# Patient Record
Sex: Male | Born: 1940
Health system: Southern US, Community
[De-identification: ages and names within clinical notes are randomized; demographics above are authoritative.]

## PROBLEM LIST (undated history)

## (undated) DIAGNOSIS — K589 Irritable bowel syndrome without diarrhea: Secondary | ICD-10-CM

## (undated) DIAGNOSIS — F329 Major depressive disorder, single episode, unspecified: Secondary | ICD-10-CM

## (undated) DIAGNOSIS — F32A Depression, unspecified: Secondary | ICD-10-CM

## (undated) DIAGNOSIS — G4730 Sleep apnea, unspecified: Secondary | ICD-10-CM

## (undated) DIAGNOSIS — M25511 Pain in right shoulder: Secondary | ICD-10-CM

## (undated) DIAGNOSIS — F419 Anxiety disorder, unspecified: Secondary | ICD-10-CM

## (undated) DIAGNOSIS — F410 Panic disorder [episodic paroxysmal anxiety] without agoraphobia: Secondary | ICD-10-CM

## (undated) DIAGNOSIS — J309 Allergic rhinitis, unspecified: Secondary | ICD-10-CM

## (undated) DIAGNOSIS — J45909 Unspecified asthma, uncomplicated: Secondary | ICD-10-CM

## (undated) DIAGNOSIS — E669 Obesity, unspecified: Secondary | ICD-10-CM

## (undated) DIAGNOSIS — E039 Hypothyroidism, unspecified: Secondary | ICD-10-CM

## (undated) DIAGNOSIS — N200 Calculus of kidney: Secondary | ICD-10-CM

## (undated) DIAGNOSIS — K219 Gastro-esophageal reflux disease without esophagitis: Secondary | ICD-10-CM

## (undated) DIAGNOSIS — M1990 Unspecified osteoarthritis, unspecified site: Secondary | ICD-10-CM

## (undated) DIAGNOSIS — E785 Hyperlipidemia, unspecified: Secondary | ICD-10-CM

## (undated) DIAGNOSIS — T7840XA Allergy, unspecified, initial encounter: Secondary | ICD-10-CM

## (undated) HISTORY — DX: Hyperlipidemia, unspecified: E78.5

## (undated) HISTORY — DX: Irritable bowel syndrome, unspecified: K58.9

## (undated) HISTORY — DX: Hypothyroidism, unspecified: E03.9

## (undated) HISTORY — DX: Sleep apnea, unspecified: G47.30

## (undated) HISTORY — DX: Obesity, unspecified: E66.9

## (undated) HISTORY — DX: Calculus of kidney: N20.0

## (undated) HISTORY — DX: Allergic rhinitis, unspecified: J30.9

## (undated) HISTORY — DX: Anxiety disorder, unspecified: F41.9

## (undated) HISTORY — DX: Allergy, unspecified, initial encounter: T78.40XA

## (undated) HISTORY — DX: Gastro-esophageal reflux disease without esophagitis: K21.9

## (undated) HISTORY — DX: Pain in right shoulder: M25.511

## (undated) HISTORY — DX: Major depressive disorder, single episode, unspecified: F32.9

## (undated) HISTORY — DX: Depression, unspecified: F32.A

## (undated) HISTORY — DX: Unspecified asthma, uncomplicated: J45.909

## (undated) HISTORY — PX: VASECTOMY: SHX75

## (undated) HISTORY — DX: Panic disorder (episodic paroxysmal anxiety): F41.0

## (undated) HISTORY — DX: Unspecified osteoarthritis, unspecified site: M19.90

## (undated) HISTORY — PX: TONSILLECTOMY: SUR1361

## (undated) SURGERY — EGD (ESOPHAGOGASTRODUODENOSCOPY)
Anesthesia: Moderate Sedation

---

## 2002-12-28 ENCOUNTER — Ambulatory Visit (HOSPITAL_COMMUNITY): Admission: RE | Admit: 2002-12-28 | Discharge: 2002-12-28 | Payer: Self-pay | Admitting: *Deleted

## 2003-02-24 HISTORY — PX: ROTATOR CUFF REPAIR: SHX139

## 2003-02-24 HISTORY — PX: CATARACT EXTRACTION BILATERAL W/ ANTERIOR VITRECTOMY: SHX1304

## 2003-02-24 HISTORY — PX: TRANSURETHRAL RESECTION OF PROSTATE: SHX73

## 2003-04-30 ENCOUNTER — Ambulatory Visit (HOSPITAL_COMMUNITY): Admission: RE | Admit: 2003-04-30 | Discharge: 2003-04-30 | Payer: Self-pay | Admitting: Orthopedic Surgery

## 2003-05-28 ENCOUNTER — Encounter: Admission: RE | Admit: 2003-05-28 | Discharge: 2003-08-26 | Payer: Self-pay | Admitting: Orthopedic Surgery

## 2003-06-11 ENCOUNTER — Ambulatory Visit (HOSPITAL_COMMUNITY): Admission: RE | Admit: 2003-06-11 | Discharge: 2003-06-11 | Payer: Self-pay | Admitting: Urology

## 2003-06-11 ENCOUNTER — Ambulatory Visit (HOSPITAL_BASED_OUTPATIENT_CLINIC_OR_DEPARTMENT_OTHER): Admission: RE | Admit: 2003-06-11 | Discharge: 2003-06-11 | Payer: Self-pay | Admitting: Urology

## 2004-04-16 ENCOUNTER — Ambulatory Visit: Payer: Self-pay | Admitting: Internal Medicine

## 2004-06-24 ENCOUNTER — Ambulatory Visit: Payer: Self-pay | Admitting: Internal Medicine

## 2004-07-24 ENCOUNTER — Ambulatory Visit: Payer: Self-pay | Admitting: Internal Medicine

## 2004-11-11 ENCOUNTER — Ambulatory Visit: Payer: Self-pay | Admitting: Internal Medicine

## 2005-03-11 ENCOUNTER — Ambulatory Visit: Payer: Self-pay | Admitting: Internal Medicine

## 2005-04-20 ENCOUNTER — Ambulatory Visit: Payer: Self-pay | Admitting: Internal Medicine

## 2005-07-14 ENCOUNTER — Ambulatory Visit: Payer: Self-pay | Admitting: Internal Medicine

## 2005-11-16 ENCOUNTER — Ambulatory Visit: Payer: Self-pay | Admitting: Internal Medicine

## 2006-02-18 ENCOUNTER — Ambulatory Visit: Payer: Self-pay | Admitting: Internal Medicine

## 2006-04-20 ENCOUNTER — Ambulatory Visit: Payer: Self-pay | Admitting: Internal Medicine

## 2006-05-18 ENCOUNTER — Ambulatory Visit: Payer: Self-pay | Admitting: Internal Medicine

## 2006-09-07 ENCOUNTER — Ambulatory Visit: Payer: Self-pay | Admitting: Internal Medicine

## 2007-01-18 ENCOUNTER — Ambulatory Visit: Payer: Self-pay | Admitting: Internal Medicine

## 2007-05-02 DIAGNOSIS — J309 Allergic rhinitis, unspecified: Secondary | ICD-10-CM | POA: Insufficient documentation

## 2007-05-02 DIAGNOSIS — J45998 Other asthma: Secondary | ICD-10-CM | POA: Insufficient documentation

## 2007-05-03 ENCOUNTER — Ambulatory Visit: Payer: Self-pay | Admitting: Internal Medicine

## 2007-06-22 ENCOUNTER — Telehealth: Payer: Self-pay | Admitting: Internal Medicine

## 2007-08-09 ENCOUNTER — Ambulatory Visit: Payer: Self-pay | Admitting: Internal Medicine

## 2007-09-26 ENCOUNTER — Telehealth: Payer: Self-pay | Admitting: Internal Medicine

## 2007-10-05 ENCOUNTER — Ambulatory Visit: Payer: Self-pay | Admitting: Gastroenterology

## 2007-10-05 DIAGNOSIS — K589 Irritable bowel syndrome without diarrhea: Secondary | ICD-10-CM | POA: Insufficient documentation

## 2007-11-08 ENCOUNTER — Ambulatory Visit: Payer: Self-pay | Admitting: Internal Medicine

## 2008-01-10 ENCOUNTER — Ambulatory Visit: Payer: Self-pay | Admitting: Internal Medicine

## 2008-05-28 ENCOUNTER — Ambulatory Visit: Payer: Self-pay | Admitting: Internal Medicine

## 2008-08-28 ENCOUNTER — Ambulatory Visit: Payer: Self-pay | Admitting: Gastroenterology

## 2008-09-11 ENCOUNTER — Ambulatory Visit: Payer: Self-pay | Admitting: Gastroenterology

## 2008-09-11 ENCOUNTER — Ambulatory Visit (HOSPITAL_COMMUNITY): Admission: RE | Admit: 2008-09-11 | Discharge: 2008-09-11 | Payer: Self-pay | Admitting: Gastroenterology

## 2008-09-11 ENCOUNTER — Encounter: Payer: Self-pay | Admitting: Gastroenterology

## 2008-09-14 ENCOUNTER — Encounter: Payer: Self-pay | Admitting: Gastroenterology

## 2008-09-17 ENCOUNTER — Telehealth (INDEPENDENT_AMBULATORY_CARE_PROVIDER_SITE_OTHER): Payer: Self-pay | Admitting: *Deleted

## 2008-09-18 ENCOUNTER — Ambulatory Visit: Payer: Self-pay | Admitting: Internal Medicine

## 2008-09-24 ENCOUNTER — Ambulatory Visit: Payer: Self-pay | Admitting: Internal Medicine

## 2008-10-04 ENCOUNTER — Ambulatory Visit: Payer: Self-pay | Admitting: Internal Medicine

## 2009-01-23 ENCOUNTER — Ambulatory Visit: Payer: Self-pay | Admitting: Internal Medicine

## 2009-05-07 ENCOUNTER — Ambulatory Visit: Payer: Self-pay | Admitting: Internal Medicine

## 2009-09-11 ENCOUNTER — Ambulatory Visit: Payer: Self-pay | Admitting: Internal Medicine

## 2009-09-17 ENCOUNTER — Ambulatory Visit: Payer: Self-pay | Admitting: Internal Medicine

## 2010-01-22 ENCOUNTER — Ambulatory Visit: Payer: Self-pay | Admitting: Internal Medicine

## 2010-03-25 NOTE — Assessment & Plan Note (Signed)
Summary: f/u 1 yr///kp   Primary Provider/Referring Provider:  Kyra Manges  CC:  Yearly follow up visit-allergies; Requests RX for epipen and Prednisone to keep on file.[name]  History of Present Illness: 05/03/07 One year vaccine follow-up, doing well. Continues to give own with no reaction. we discussed safety and goals again today, and reviewed his meds. 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 He coughs some in bed at night, even  though he uses advair and albuterol. Rare need for WASO inhaler. Denies any reflux. coughs supine, not in recliner. Recalls past stretches of up to 18 months when he says he had sustained asthma- years ago.Sleeps on water bed. Encasings on pillows.  09/18/08- Allergic rhinitis, Asthma .............................Kevin Kitchenwife year Notices easier dyspnea felt especially as  episodic "exhaustion". Notices more coughing over at least several months. Preferred advair to Symbicort. Coughs more during the day, nonproductive. denies any sense of reflux or postnasal drip. Denies shortness of breath. Occasionally 1/4 tab xanax calms him to sleep past cough. He feels "congested" now.   September 17, 2009- Allergic rhinitis, Asthma Mows 4 acres of grass, not having a problem with the summer heat. Wife in nursing home for Alzheimer's. Not much cough, except incidetntally noted a cough as he came in this building.  Continues allergy vaccine. Uses Proair only at bedtime. 1/2 Xanax helps him settle to sleep without coughing. PFT- 09/2008- Large normal lungs w/o response to bronchodilator. FEV1 3.76/ 126%; FEV1/FVC 0.80. CXR- NAD, small benign nodule right base     Asthma History    Initial Asthma Severity Rating:    Age range: 12+ years    Symptoms: 0-2 days/week    Nighttime Awakenings: 0-2/month    Interferes w/ normal activity: no limitations    SABA use (not for EIB): 0-2 days/week    Asthma Severity Assessment: Intermittent   Preventive Screening-Counseling &  Management  Alcohol-Tobacco     Smoking Status: quit     Year Started: 1961     Year Quit: 1971  Current Medications (verified): 1)  Fluoxetine Hcl 10 Mg  Tabs (Fluoxetine Hcl) .... Take 3 Tablets By Mouth Once Daily 2)  Aspirin 325 Mg  Tabs (Aspirin) .... Take 1 Tablet By Mouth Once A Day 3)  Multivitamins   Tabs (Multiple Vitamin) .... Take 1 Tablet By Mouth Once A Day 4)  Simvastatin 20 Mg  Tabs (Simvastatin) .... Take 1 Tablet By Mouth Once A Day 5)  Proair Hfa 108 (90 Base) Mcg/act  Aers (Albuterol Sulfate) .... As Needed 6)  Synthroid 75 Mcg Tabs (Levothyroxine Sodium) .[name].. 1 Once Daily 7)  Allergy Vaccine 1:10 Go (W-E) .... Injection 1 Time Per Week 8)  Epipen 0.3 Mg/0.31ml (1:1000) Devi (Epinephrine Hcl (Anaphylaxis)) .... For Severe Allergic Reaction 9)  Advair Diskus 250-50 Mcg/dose  Misc (Fluticasone-Salmeterol) .[name].. 1 Puff and Rinse Two Times A Day  Allergies (verified): No Known Drug Allergies  Past History:  Past Medical History: Last updated: 10/05/2007 Current Problems:  IRRITABLE BOWEL SYNDROME (ICD-564.1) ALLERGIC RHINITIS (ICD-477.9) ASTHMA (ICD-493.90) Hyperlipidemia Hypothyroidism Kidney Stones Obesity  Past Surgical History: Last updated: 10/05/2007 Tonsillectomy vastectomy Rotator cuff repair left shoulder bilateral cataract removal TURP  Family History: Last updated: 10/05/2007 Family History of Breast Cancer: Mother No FH of Colon Cancer: Family History of Diabetes: Father Family History of Heart Disease: Father  Social History: Last updated: 09/18/2008 Occupation:  Retired Patient is a former smoker. -stopped 38 years ago Alcohol Use - no Illicit Drug Use - no  Patient does not get regular exercise.  wife has dementia  Risk Factors: Exercise: no (10/05/2007)  Risk Factors: Smoking Status: quit (09/17/2009)  Review of Systems      See HPI  The patient denies shortness of breath with activity, shortness of breath at rest,  productive cough, non-productive cough, coughing up blood, chest pain, irregular heartbeats, acid heartburn, indigestion, loss of appetite, weight change, abdominal pain, difficulty swallowing, sore throat, tooth/dental problems, headaches, nasal congestion/difficulty breathing through nose, and sneezing.    Vital Signs:  Patient profile:   70 year old male Height:      69 inches Weight:      207.38 pounds BMI:     30.74 O2 Sat:      93 % on Room air Pulse rate:   77 / minute BP sitting:   136 / 82  (left arm) Cuff size:   regular  Vitals Entered By: Reynaldo Minium CMA (September 17, 2009 11:19 AM)  O2 Flow:  Room air CC: Yearly follow up visit-allergies; Requests RX for epipen and Prednisone to keep on file.   Physical Exam  Additional Exam:  General: A/Ox3; pleasant and cooperative, NAD, SKIN: no rash, lesions NODES: no lymphadenopathy HEENT: Dolliver/AT, EOM- WNL, Conjuctivae- clear, PERRLA, TM-WNL, Nose- clear, Throat- clear and wnl. Talkative. NECK: Supple w/ fair ROM, JVD- none, normal carotid impulses w/o bruits Thyroid- CHEST: Clear to P&A, somewhat brassy cough  HEART: RRR, no m/g/r heard ABDOMEN: Soft and nl; RKY:HCWC, nl pulses, no edema  NEURO: Grossly intact to observation      CXR  Procedure date:  09/18/2008  Findings:      DG CHEST 2 VIEW - [id number]   Clinical Data: 70 year old male with cough.   CHEST - 2 VIEW   Comparison: 04/25/2003.   Findings: Stable lung volumes.  Cardiac size and mediastinal contours are within normal limits.  No pneumothorax, pulmonary edema, pleural effusion, consolidation, or confluent airspace opacity.  Stable small nodular opacity at the right lateral lung base, indicative of benign etiology. No acute osseous abnormality identified.   IMPRESSION:   1. No acute cardiopulmonary abnormality. 2.  Small benign lung nodule at the right base.   Read By:  Augusto Gamble,  M.D.     Released By:  Augusto Gamble,  M.D.   Impression &  Recommendations:  Problem # 1:  ASTHMA (ICD-493.90) He has mild intermittent asthma. We reviewed his CXR and PFT.  We are refilling meds and his standby script for prednisone sine it has been helpful for him to have access. We again reviewed steroid side effects, noting he very rarely takes it- not more than once/ year.  Problem # 2:  ALLERGIC RHINITIS (ICD-477.9) He continues to find allergy vaccine helpful. We discussed environmental precautions again.  Medications Added to Medication List This Visit: 1)  Prednisone 10 Mg Tabs (Prednisone) .[name].. 1 tab four times daily x 2 days, 3 times daily x 2 days, 2 times daily x 2 days, 1 time daily x 2 days  Other Orders: Est. Patient Level IV (17616)  Patient Instructions: 1)  Please schedule a follow-up appointment in 1 year. Prescriptions: PREDNISONE 10 MG TABS (PREDNISONE) 1 tab four times daily x 2 days, 3 times daily x 2 days, 2 times daily x 2 days, 1 time daily x 2 days  #20 x 1   Entered and Authorized by:   Waymon Budge MD   Signed by:   Waymon Budge MD on 09/17/2009  Method used:   Print then Give to Patient   RxID:   [id number] ADVAIR DISKUS 250-50 MCG/DOSE  MISC (FLUTICASONE-SALMETEROL) 1 puff and rinse two times a day  #1 x prn   Entered and Authorized by:   Waymon Budge MD   Signed by:   Waymon Budge MD on 09/17/2009   Method used:   [facility] to        [facility] [phone]* (retail)       [address]       [place], [place]  [address]       Ph: [phone] or [phone]       Fax: [phone]   RxID:   [id number] EPIPEN 0.3 MG/0.3ML (1:1000) DEVI (EPINEPHRINE HCL (ANAPHYLAXIS)) For severe allergic reaction  #1 x prn   Entered and Authorized by:   Waymon Budge MD   Signed by:   Waymon Budge MD on 09/17/2009   Method used:   [facility]ically to        [facility] [phone]* (retail)       [address]       [place],  [place]  [address]       Ph: [phone] or [phone]       Fax: [phone]   RxID:   [id number] PROAIR HFA 108 (90 BASE) MCG/ACT  AERS (ALBUTEROL SULFATE) as needed  #9 Gram x prn   Entered and Authorized by:   Waymon Budge MD   Signed by:   Waymon Budge MD on 09/17/2009   Method used:   [facility] to        [facility] [phone]* (retail)       [address]       [place], [place]  [address]       Ph: [phone] or [phone]       Fax: [phone]   RxID:   [id number]      CXR  Procedure date:  09/18/2008  Findings:      DG CHEST 2 VIEW - [id number]   Clinical Data: 70 year old male with cough.   CHEST - 2 VIEW   Comparison: 04/25/2003.   Findings: Stable lung volumes.  Cardiac size and mediastinal contours are within normal limits.  No pneumothorax, pulmonary edema, pleural effusion, consolidation, or confluent airspace opacity.  Stable small nodular opacity at the right lateral lung base, indicative of benign etiology. No acute osseous abnormality identified.   IMPRESSION:   1. No acute cardiopulmonary abnormality. 2.  Small benign lung nodule at the right base.   Read By:  Augusto Gamble,  M.D.     Released By:  Augusto Gamble,  M.D.

## 2010-05-05 ENCOUNTER — Ambulatory Visit (INDEPENDENT_AMBULATORY_CARE_PROVIDER_SITE_OTHER): Payer: Medicare Other

## 2010-05-05 DIAGNOSIS — J301 Allergic rhinitis due to pollen: Secondary | ICD-10-CM

## 2010-05-06 ENCOUNTER — Encounter: Payer: Self-pay | Admitting: Internal Medicine

## 2010-05-06 DIAGNOSIS — J3089 Other allergic rhinitis: Secondary | ICD-10-CM | POA: Insufficient documentation

## 2010-05-13 NOTE — Assessment & Plan Note (Signed)
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

## 2010-07-11 NOTE — Op Note (Signed)
NAME:  [name], [name]                         ACCOUNT NO.:  [id number]   MEDICAL RECORD NO.:  [id number]                   PATIENT TYPE:  AMB   LOCATION:  [facility]                                 FACILITY:  [facility]   PHYSICIAN:  Maretta Bees. Vonita Moss, M.D.             DATE OF BIRTH:  Aug 26, 1940   DATE OF PROCEDURE:  DATE OF DISCHARGE:                                 OPERATIVE REPORT   PREOPERATIVE DIAGNOSIS:  Benign prostatic hypertrophy and urinary retention.   POSTOPERATIVE DIAGNOSIS:  Benign prostatic hypertrophy and urinary  retention.   OPERATION/PROCEDURE:  1. Cystoscopy.  2. Transurethral resection incision of prostate.   SURGEON:  Maretta Bees. Vonita Moss, M.D.   ANESTHESIA:  General.   INDICATIONS:  This 70 year old gentleman has had a long history of bladder  outlet obstructive symptoms.  He went into urinary retention after shoulder  surgery.  He has been on UroXatral and I&O catheterization.  After four  weeks of no voiding, he finally starting voiding again, but still has some  residual urine and still has is baseline bladder outlet obstruction.  In the  course of working him up, he had a prostate ultrasound that showed a small  prostate at 18 mL.  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  We discussed various options  and he feels like he still needs some added relief and will only get worse  in time even though he is on UroXatral and I had previously discussed the  possibility of TUIP or TUR median lobe or possibly TUR of the prostate that  could likely be done as an outpatient.  He is brought to the OR today for  further evaluation and therapy.   DESCRIPTION OF PROCEDURE:  The patient was brought to the operating room and  placed in the lithotomy position.  The external genitalia were prepped and  draped in the usual fashion. He was cystoscoped.   The anterior urethra was  normal.  He had a small short prostate with a high bladder neck but no  significant median lobe.  The bladder itself was unremarkable.  I felt like  he would be a good candidate for TUI of the prostate including the bladder  neck.  Therefore,  I inserted a 24-French resectoscope and with the Collings  knife, I was able to incise the bladder neck and the floor of the urethra  down to the verumontanum and felt like I created significant increase in the  size of his prostate and prostatic urethra and bladder neck.  I coagulated a  few tiny bleeders and at this point there was excellent hemostasis and after  leaving the catheter out and giving him a voiding trial to see if he can do  well without a catheter postoperatively, but certainly if  he needs one, he  will be sent home with one for a couple of days.  He tolerated the procedure  well.                                               Maretta Bees. Vonita Moss, M.D.    LJP/MEDQ  D:  06/11/2003  T:  06/11/2003  Job:  [id number]

## 2010-07-11 NOTE — Op Note (Signed)
NAME:  [name], [name]                         ACCOUNT NO.:  [id number]   MEDICAL RECORD NO.:  [id number]                   PATIENT TYPE:  AMB   LOCATION:  DAY                                  FACILITY:  [facility]   PHYSICIAN:  Georges Lynch. Gioffre, M.D.             DATE OF BIRTH:  1940/09/08   DATE OF PROCEDURE:  04/30/2003  DATE OF DISCHARGE:                                 OPERATIVE REPORT   PREOPERATIVE DIAGNOSIS:  Severe impingement syndrome, left shoulder, with  some irregular tears, partial tears of his rotator cuff.   POSTOPERATIVE DIAGNOSIS:  Postoperative diagnosis is more significant.  He  had a complete disruption of his rotator cuff tendon on the left with severe  impingement syndrome on the left.   OPERATION:  1. Partial acromionectomy with acromioplasty.  2. Repair of the rotator cuff tendon by utilizing a graft jacket, double     thickness, with two Multi-Tack anchors.   PROCEDURE:  Under general anesthesia, routine orthopedic prep and draping of  the left shoulder was carried out.  At this time an incision was made over  the anterior aspect of the left shoulder and bleeders identified and  cauterized.  I then stripped the deltoid tendon by sharp dissection from the  acromion.  I protected the remaining rotator cuff with the Bennett  retractor, utilized the oscillating saw, and did a partial acromionectomy,  then utilized the bur to even the undersurface of the acromion.  He had  severe impingement syndrome.  His acromion literally was embedding down into  the hole where his tendon was torn.  At this time I then identified the  lateral articular surface of the humerus.  I utilized the bur, burred down  lateral articular cartilage until I had bleeding bone.  I then inserted two  Multi-Tack sutures into the proximal humerus.  I then sutured a 4 x 5 double-  thickness graft jacket in place.  Thoroughly irrigated out the area.  I  reapproximated the deltoid tendon and muscle  in the usual fashion.  The skin  was closed with metal staples and the subcu was closed with 0 Vicryl.  A  sterile Neosporin dressing was applied.  The patient was placed in a  shoulder immobilizer.  He had 1 g of IV Ancef preoperative.  Before the  general anesthetic, he did have an interscalene block.                                               Ronald A. Darrelyn Hillock, M.D.    RAG/MEDQ  D:  04/30/2003  T:  04/30/2003  Job:  [id number]

## 2010-07-11 NOTE — Assessment & Plan Note (Signed)
[facility]                             PULMONARY OFFICE NOTE   NAME:[name]                      MRN:          [id number]  DATE:04/20/2006                            DOB:          17-Sep-1940    PROBLEM LIST:  1. Asthma.  2. Allergic rhinitis.   HISTORY:  One-year followup. No serious problems in the past year. Last  week he noted some wheezing while lying supine and used his Albuterol.  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 His wife has dementia so he is beginning to feel  physically stressed some. He did get a flu shot this year.   MEDICATIONS:  1. Advair 250/50.  2. Allergy vaccine at 110 with no problems.  3. Fluoxetine 30 mg.  4. Aspirin 325 mg.  5. Simvastatin 20 mg.  6. Albuterol rescue inhaler.   No medication allergy. He very rarely uses his Albuterol.   OBJECTIVE:  Weight 204 pounds, blood pressure 122/70, pulse regular 70,  room air saturation 97%. The lungs are now clear. There is no stridor or  neck vein distention, no peripheral edema. Heart sounds are regular.   IMPRESSION:  Rhinitis and asthmatic bronchitis with recent exacerbation  probably viral.   PLAN:  1. Continue vaccine.  2. Prednisone 8 day taper from 40 mg to hold at his request and with      steroid talk done. We refilled his Epipen with discussion. We      reviewed allergy vaccine risks, benefits and goals with the      discussion of concerns related to administration outside of a      medical office, anaphylaxis and epinephrine.  3. We refilled his Advair 250/50 and his albuterol rescue inhaler.  4. Schedule return in one year but earlier p.r.n.     Clinton D. Maple Hudson, MD, Tonny Bollman, FACP  Electronically Signed    CDY/MedQ  DD: 04/20/2006  DT: 04/21/2006  Job #: [id number]

## 2010-09-09 ENCOUNTER — Ambulatory Visit (INDEPENDENT_AMBULATORY_CARE_PROVIDER_SITE_OTHER): Payer: Medicare Other

## 2010-09-09 DIAGNOSIS — J309 Allergic rhinitis, unspecified: Secondary | ICD-10-CM

## 2010-09-16 ENCOUNTER — Encounter: Payer: Self-pay | Admitting: Internal Medicine

## 2010-09-16 ENCOUNTER — Ambulatory Visit (INDEPENDENT_AMBULATORY_CARE_PROVIDER_SITE_OTHER): Payer: Medicare Other | Admitting: Internal Medicine

## 2010-09-16 VITALS — BP 118/72 | HR 75 | Ht 69.0 in | Wt 196.8 lb

## 2010-09-16 DIAGNOSIS — J301 Allergic rhinitis due to pollen: Secondary | ICD-10-CM

## 2010-09-16 DIAGNOSIS — J45909 Unspecified asthma, uncomplicated: Secondary | ICD-10-CM

## 2010-09-16 MED ORDER — PREDNISONE 10 MG PO TABS: ORAL_TABLET | ORAL | Status: DC

## 2010-09-16 MED ORDER — FLUTICASONE-SALMETEROL 250-50 MCG/DOSE IN AEPB: 1.0000 | INHALATION_SPRAY | Freq: Two times a day (BID) | RESPIRATORY_TRACT | Status: DC

## 2010-09-16 MED ORDER — METHYLPREDNISOLONE ACETATE 80 MG/ML IJ SUSP
80.0000 mg | Freq: Once | INTRAMUSCULAR | Status: AC
  Administered 2010-09-16: 80 mg via INTRA_ARTICULAR

## 2010-09-16 MED ORDER — EPINEPHRINE 0.3 MG/0.3ML IJ DEVI: 0.3000 mg | Freq: Once | INTRAMUSCULAR | Status: DC

## 2010-09-16 MED ORDER — ALBUTEROL SULFATE HFA 108 (90 BASE) MCG/ACT IN AERS: 2.0000 | INHALATION_SPRAY | Freq: Four times a day (QID) | RESPIRATORY_TRACT | Status: DC | PRN

## 2010-09-16 MED ORDER — ALBUTEROL SULFATE (2.5 MG/3ML) 0.083% IN NEBU
2.5000 mg | INHALATION_SOLUTION | Freq: Once | RESPIRATORY_TRACT | Status: AC
  Administered 2010-09-16: 2.5 mg via RESPIRATORY_TRACT

## 2010-09-16 MED ORDER — ALPRAZOLAM 0.5 MG PO TABS: 0.5000 mg | ORAL_TABLET | Freq: Three times a day (TID) | ORAL | Status: DC | PRN

## 2010-09-16 NOTE — Assessment & Plan Note (Addendum)
Continue vaccine he thinks it continues to help him.
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
## 2010-09-16 NOTE — Patient Instructions (Signed)
Meds refilled  Today- neb albut   Depo 80
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
## 2010-09-18 ENCOUNTER — Telehealth: Payer: Self-pay | Admitting: Internal Medicine

## 2010-09-18 NOTE — Telephone Encounter (Signed)
[email or web address]  

## 2010-09-19 NOTE — Telephone Encounter (Signed)
Spoke with patient-aware of directions for prednisone Rx given by Cy.

## 2010-12-24 ENCOUNTER — Ambulatory Visit (INDEPENDENT_AMBULATORY_CARE_PROVIDER_SITE_OTHER): Payer: Medicare Other

## 2010-12-24 DIAGNOSIS — J309 Allergic rhinitis, unspecified: Secondary | ICD-10-CM

## 2011-04-08 ENCOUNTER — Ambulatory Visit (INDEPENDENT_AMBULATORY_CARE_PROVIDER_SITE_OTHER): Payer: Medicare Other

## 2011-04-08 DIAGNOSIS — J309 Allergic rhinitis, unspecified: Secondary | ICD-10-CM

## 2011-05-29 ENCOUNTER — Telehealth: Payer: Self-pay | Admitting: Internal Medicine

## 2011-05-29 NOTE — Telephone Encounter (Signed)
ATC pt line busy wcb 

## 2011-05-29 NOTE — Telephone Encounter (Signed)
Pt last seen 08/2010  And has pending appt  08/2011 with CY.  Please advise if ok to send in refill of the pts alprazolam.  thanks

## 2011-06-01 NOTE — Telephone Encounter (Signed)
Ok to refill till next ov only. After that, his PCP should be the one to fill it.

## 2011-06-02 MED ORDER — ALPRAZOLAM 0.5 MG PO TABS: 0.5000 mg | ORAL_TABLET | Freq: Three times a day (TID) | ORAL | Status: DC | PRN

## 2011-06-02 NOTE — Telephone Encounter (Signed)
[facility] spoke [facility] Lelon Mast, advised pt may have refills until next ov but after that requests need to go to his PCP per CDY.  Lelon Mast verbalized her understanding.    Alprazolam 0.5mg  #60 (this quantity was what was last filled by CDY) with 3 additional refills given verbally.  Pt's next appt with CDY 7.25.13.  Med list updated.

## 2011-08-05 ENCOUNTER — Ambulatory Visit (INDEPENDENT_AMBULATORY_CARE_PROVIDER_SITE_OTHER): Payer: Medicare Other

## 2011-08-05 DIAGNOSIS — J309 Allergic rhinitis, unspecified: Secondary | ICD-10-CM

## 2011-08-06 ENCOUNTER — Ambulatory Visit (INDEPENDENT_AMBULATORY_CARE_PROVIDER_SITE_OTHER): Payer: Medicare Other

## 2011-08-06 DIAGNOSIS — J309 Allergic rhinitis, unspecified: Secondary | ICD-10-CM

## 2011-09-17 ENCOUNTER — Encounter: Payer: Self-pay | Admitting: Internal Medicine

## 2011-09-17 ENCOUNTER — Ambulatory Visit (INDEPENDENT_AMBULATORY_CARE_PROVIDER_SITE_OTHER): Payer: Medicare Other | Admitting: Internal Medicine

## 2011-09-17 VITALS — BP 132/84 | HR 66 | Ht 69.5 in | Wt 206.8 lb

## 2011-09-17 DIAGNOSIS — J45909 Unspecified asthma, uncomplicated: Secondary | ICD-10-CM

## 2011-09-17 DIAGNOSIS — J45998 Other asthma: Secondary | ICD-10-CM

## 2011-09-17 DIAGNOSIS — Z23 Encounter for immunization: Secondary | ICD-10-CM

## 2011-09-17 MED ORDER — ALPRAZOLAM 0.5 MG PO TABS: 0.5000 mg | ORAL_TABLET | Freq: Three times a day (TID) | ORAL | Status: DC | PRN

## 2011-09-17 MED ORDER — ALBUTEROL SULFATE HFA 108 (90 BASE) MCG/ACT IN AERS: 2.0000 | INHALATION_SPRAY | RESPIRATORY_TRACT | Status: DC | PRN

## 2011-09-17 MED ORDER — FLUTICASONE-SALMETEROL 250-50 MCG/DOSE IN AEPB: 1.0000 | INHALATION_SPRAY | Freq: Two times a day (BID) | RESPIRATORY_TRACT | Status: DC

## 2011-09-17 NOTE — Patient Instructions (Addendum)
Refill scripts for alprazolam, Advair and Proair  Ok to try using proair during the day if needed  Sample Tudorza inhaler 1 puff twice every day. See if it helps your breathing. Watch out for urinary retention.  Sample Arcapta inhaler- try one daily
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
## 2011-09-23 ENCOUNTER — Encounter: Payer: Self-pay | Admitting: Internal Medicine

## 2011-09-23 NOTE — Assessment & Plan Note (Signed)
I am not sure his sense of chest tightness is due to bronchospasm. His tired feeling is very nonspecific. We can test response to medication within our scope but I told him if it doesn't help, he should talk with his primary physician. We can repeat spirometry if necessary.

## 2011-11-25 ENCOUNTER — Ambulatory Visit (INDEPENDENT_AMBULATORY_CARE_PROVIDER_SITE_OTHER): Payer: Medicare Other

## 2011-11-25 DIAGNOSIS — J309 Allergic rhinitis, unspecified: Secondary | ICD-10-CM

## 2011-12-22 ENCOUNTER — Other Ambulatory Visit: Payer: Self-pay | Admitting: Internal Medicine

## 2012-03-24 ENCOUNTER — Other Ambulatory Visit: Payer: Self-pay | Admitting: Internal Medicine

## 2012-03-24 NOTE — Telephone Encounter (Signed)
Ok to refill till next scheduled OV

## 2012-03-24 NOTE — Telephone Encounter (Signed)
Please advise if okay to refill. Thanks.  

## 2012-04-04 ENCOUNTER — Ambulatory Visit (INDEPENDENT_AMBULATORY_CARE_PROVIDER_SITE_OTHER): Payer: Medicare Other

## 2012-04-04 DIAGNOSIS — J309 Allergic rhinitis, unspecified: Secondary | ICD-10-CM

## 2012-05-20 ENCOUNTER — Other Ambulatory Visit: Payer: Self-pay | Admitting: Family Medicine

## 2012-05-20 ENCOUNTER — Telehealth: Payer: Self-pay

## 2012-05-20 MED ORDER — FLUOXETINE HCL 40 MG PO CAPS: 40.0000 mg | ORAL_CAPSULE | Freq: Every day | ORAL | Status: DC

## 2012-05-20 NOTE — Telephone Encounter (Signed)
Rx refilled fluoxetine 40 mg daily #90 with 3 refills

## 2012-05-20 NOTE — Telephone Encounter (Signed)
Sending chart back for review. Please verify dose of Fluoxetine. Thanks.

## 2012-07-06 ENCOUNTER — Ambulatory Visit: Payer: Medicare Other

## 2012-07-22 ENCOUNTER — Ambulatory Visit (INDEPENDENT_AMBULATORY_CARE_PROVIDER_SITE_OTHER): Payer: Medicare Other | Admitting: Family Medicine

## 2012-07-22 ENCOUNTER — Encounter: Payer: Self-pay | Admitting: Family Medicine

## 2012-07-22 VITALS — BP 126/81 | HR 67 | Temp 98.2°F | Wt 194.8 lb

## 2012-07-22 DIAGNOSIS — F329 Major depressive disorder, single episode, unspecified: Secondary | ICD-10-CM

## 2012-07-22 DIAGNOSIS — E559 Vitamin D deficiency, unspecified: Secondary | ICD-10-CM | POA: Insufficient documentation

## 2012-07-22 DIAGNOSIS — F3289 Other specified depressive episodes: Secondary | ICD-10-CM

## 2012-07-22 DIAGNOSIS — E039 Hypothyroidism, unspecified: Secondary | ICD-10-CM

## 2012-07-22 DIAGNOSIS — J45909 Unspecified asthma, uncomplicated: Secondary | ICD-10-CM | POA: Insufficient documentation

## 2012-07-22 DIAGNOSIS — N400 Enlarged prostate without lower urinary tract symptoms: Secondary | ICD-10-CM | POA: Insufficient documentation

## 2012-07-22 DIAGNOSIS — N318 Other neuromuscular dysfunction of bladder: Secondary | ICD-10-CM

## 2012-07-22 DIAGNOSIS — N3281 Overactive bladder: Secondary | ICD-10-CM | POA: Insufficient documentation

## 2012-07-22 DIAGNOSIS — F32A Depression, unspecified: Secondary | ICD-10-CM | POA: Insufficient documentation

## 2012-07-22 DIAGNOSIS — E785 Hyperlipidemia, unspecified: Secondary | ICD-10-CM | POA: Insufficient documentation

## 2012-07-22 LAB — COMPLETE METABOLIC PANEL WITH GFR
ALT: 28 U/L (ref 0–53)
AST: 28 U/L (ref 0–37)
Albumin: 4.7 g/dL (ref 3.5–5.2)
Alkaline Phosphatase: 68 U/L (ref 39–117)
BUN: 9 mg/dL (ref 6–23)
CO2: 25 mEq/L (ref 19–32)
Calcium: 8.9 mg/dL (ref 8.4–10.5)
Chloride: 104 mEq/L (ref 96–112)
Creat: 0.71 mg/dL (ref 0.50–1.35)
GFR, Est African American: >89 mL/min
GFR, Est Non African American: >89 mL/min
Glucose, Bld: 97 mg/dL (ref 70–99)
Potassium: 4.1 mEq/L (ref 3.5–5.3)
Sodium: 140 mEq/L (ref 135–145)
Total Bilirubin: 0.7 mg/dL (ref 0.3–1.2)
Total Protein: 7 g/dL (ref 6.0–8.3)

## 2012-07-22 LAB — TSH: TSH: 2.313 u[IU]/mL (ref 0.350–4.500)

## 2012-07-22 NOTE — Patient Instructions (Addendum)
      Dr Guenther Dunshee's Recommendations  Diet and Exercise discussed with patient.  For nutrition information, I recommend books:  1).Eat to Live by Dr Joel Fuhrman. 2).Prevent and Reverse Heart Disease by Dr Caldwell Esselstyn. 3) Dr Neal Barnard's Book: Reversing Diabetes  Exercise recommendations are:  If unable to walk, then the patient can exercise in a chair 3 times a day. By flapping arms like a bird gently and raising legs outwards to the front.  If ambulatory, the patient can go for walks for 30 minutes 3 times a week. Then increase the intensity and duration as tolerated.  Goal is to try to attain exercise frequency to 5 times a week.  If applicable: Best to perform resistance exercises (machines or weights) 2 days a week and cardio type exercises 3 days per week.  

## 2012-07-22 NOTE — Progress Notes (Signed)
Patient ID: [name], male   DOB: 1941-01-25, 72 y.o.   MRN: [id number] SUBJECTIVE: HPI: Patient is here for follow up of hyperlipidemia/asthma / depressionhypothyroidism:  denies Headache;denies Chest Pain;denies weakness;denies Shortness of Breath and orthopnea;denies Visual changes;denies palpitations;denies cough;denies pedal edema;denies symptoms of TIA or stroke;deniesClaudication symptoms. admits to Compliance with medications; denies Problems with medications. Items to discuss: Recent flare up of GERD at  10pm, 4 hours after dinner. Took Gaviscon and no symptoms this week. ? Need for Aspirin? ? Need for vitamins.  PMH/PSH: reviewed/updated in Epic  SH/FH: reviewed/updated in Epic  Allergies: reviewed/updated in Epic  Medications: reviewed/updated in Epic  Immunizations: reviewed/updated in Epic  ROS: As above in the HPI. All other systems are stable or negative.  OBJECTIVE: APPEARANCE:  Patient in no acute distress.The patient appeared well nourished and normally developed. Acyanotic. Waist: VITAL SIGNS:BP 126/81  Pulse 67  Temp(Src) 98.2 F (36.8 C) (Oral)  Wt 194 lb 12.8 oz (88.361 kg)  BMI 28.36 kg/m2 WM  SKIN: warm and  Dry without overt rashes, tattoos and scars  HEAD and Neck: without JVD, Head and scalp: normal Eyes:No scleral icterus. Fundi normal, eye movements normal. Ears: Auricle normal, canal normal, Tympanic membranes normal, insufflation normal. Nose: normal Throat: normal Neck & thyroid: normal  CHEST & LUNGS: Chest wall: normal Lungs: Clear  CVS: Reveals the PMI to be normally located. Regular rhythm, First and Second Heart sounds are normal,  absence of murmurs, rubs or gallops. Peripheral vasculature: Radial pulses: normal Dorsal pedis pulses: normal Posterior pulses: normal  ABDOMEN:  Appearance:obese Benign,, no organomegaly, no masses, no Abdominal Aortic enlargement. No Guarding , no rebound. No Bruits. Bowel  sounds: normal  RECTAL: N/A GU: N/A  EXTREMETIES: nonedematous. Both Femoral and Pedal pulses are normal.  MUSCULOSKELETAL:  Spine: normal Joints: intact  NEUROLOGIC: oriented to time,place and person; nonfocal. Strength is normal Sensory is normal Reflexes are normal Cranial Nerves are normal.  Psychiatry: Stable  ASSESSMENT: HLD (hyperlipidemia) - Plan: COMPLETE METABOLIC PANEL WITH GFR, NMR Lipoprofile with Lipids  Unspecified hypothyroidism - Plan: TSH  BPH (benign prostatic hyperplasia)  Unspecified asthma(493.90)  OAB (overactive bladder)  Depression  Unspecified vitamin D deficiency - Plan: Vitamin D 25 hydroxy  PLAN: May stop the MVI May stop the CoQ 10 Reduce the ASA  To 81 mg GERD precautions.      Dr Woodroe Mode Recommendations  Diet and Exercise discussed with patient.  For nutrition information, I recommend books:  1).Eat to Live by Dr Monico Hoar. 2).Prevent and Reverse Heart Disease by Dr Suzzette Righter. 3) Dr Katherina Right Book: Reversing Diabetes  Exercise recommendations are:  If unable to walk, then the patient can exercise in a chair 3 times a day. By flapping arms like a bird gently and raising legs outwards to the front.  If ambulatory, the patient can go for walks for 30 minutes 3 times a week. Then increase the intensity and duration as tolerated.  Goal is to try to attain exercise frequency to 5 times a week.  If applicable: Best to perform resistance exercises (machines or weights) 2 days a week and cardio type exercises 3 days per week.  Orders Placed This Encounter  Procedures  . COMPLETE METABOLIC PANEL WITH GFR  . NMR Lipoprofile with Lipids  . TSH  . Vitamin D 25 hydroxy  supportive therapy  No orders of the defined types were placed in this encounter.   No results found for this or any previous  visit. Return in about 6 months (around 01/22/2013) for Recheck medical problems. Layne Dilauro P. [place] Charon, M.D.

## 2012-07-23 LAB — VITAMIN D 25 HYDROXY (VIT D DEFICIENCY, FRACTURES): Vit D, 25-Hydroxy: 51 ng/mL (ref 30–89)

## 2012-07-26 LAB — NMR LIPOPROFILE WITH LIPIDS
Cholesterol, Total: 116 mg/dL (ref <200)
HDL Particle Number: 33.9 umol/L (ref >=30.5)
HDL Size: 8.4 nm — ABNORMAL LOW (ref >=9.2)
HDL-C: 40 mg/dL (ref >=40)
LDL (calc): 55 mg/dL (ref <100)
LDL Particle Number: 1088 nmol/L — ABNORMAL HIGH (ref <1000)
LDL Size: 20.1 nm — ABNORMAL LOW (ref >20.5)
LP-IR Score: 53 — ABNORMAL HIGH (ref <=45)
Large HDL-P: <1.3 umol/L — ABNORMAL LOW (ref >=4.8)
Large VLDL-P: 0.8 nmol/L (ref <=2.7)
Small LDL Particle Number: 741 nmol/L — ABNORMAL HIGH (ref <=527)
Triglycerides: 106 mg/dL (ref <150)
VLDL Size: 45.6 nm (ref <=46.6)

## 2012-07-26 NOTE — Progress Notes (Signed)
Quick Note:  Lab result at goal. No change in Medications for now. No Change in plans and follow up. ______ 

## 2012-09-16 ENCOUNTER — Encounter: Payer: Self-pay | Admitting: Internal Medicine

## 2012-09-16 ENCOUNTER — Ambulatory Visit (INDEPENDENT_AMBULATORY_CARE_PROVIDER_SITE_OTHER): Payer: Medicare Other | Admitting: Internal Medicine

## 2012-09-16 VITALS — BP 120/90 | HR 75 | Ht 69.5 in | Wt 200.8 lb

## 2012-09-16 DIAGNOSIS — J301 Allergic rhinitis due to pollen: Secondary | ICD-10-CM

## 2012-09-16 DIAGNOSIS — J45998 Other asthma: Secondary | ICD-10-CM

## 2012-09-16 DIAGNOSIS — J45909 Unspecified asthma, uncomplicated: Secondary | ICD-10-CM

## 2012-09-16 MED ORDER — ALBUTEROL SULFATE HFA 108 (90 BASE) MCG/ACT IN AERS: INHALATION_SPRAY | RESPIRATORY_TRACT | Status: DC

## 2012-09-16 MED ORDER — EPINEPHRINE 0.3 MG/0.3ML IJ SOAJ: 0.3000 mg | Freq: Once | INTRAMUSCULAR | Status: DC

## 2012-09-16 MED ORDER — FLUTICASONE-SALMETEROL 250-50 MCG/DOSE IN AEPB: 1.0000 | INHALATION_SPRAY | Freq: Two times a day (BID) | RESPIRATORY_TRACT | Status: DC

## 2012-09-16 MED ORDER — ALPRAZOLAM 0.5 MG PO TABS: 0.5000 mg | ORAL_TABLET | Freq: Three times a day (TID) | ORAL | Status: DC | PRN

## 2012-09-16 NOTE — Progress Notes (Signed)
Subjective:    Patient ID: [name], male    DOB: Oct 30, 1940, 72 y.o.   MRN: [id number]  HPI 09/16/10- 69 yoM followed for asthma, allergic rhinitis Last here 09/17/09 Still mowing 4 acres of grass. Storm fronts bring chest tightness and asthma. This has been a worse summer for this. Interferes with sleep- using alprazolam for cough. Proair not enough help. Uses Advair 250 bid.  Continues allergy vaccine- needs epipen update. Would like prednisone taper to hold.  Wife died last month of Alzheimers. Stress has played on his asthma. He caught a cold 3 days later. Nose congested and draining. He asks also for update pertussis vaccine- last when child.  09/17/11- 69 yoM followed for asthma, allergic rhinitis Continues allergy vaccine 1:10 GO. Last year we treated exacerbation which did respond. Always weather sensitive and current wet and humidity are difficult.  He has some dry cough lying down but interprets a "tired" feeling as his asthma without active wheezing. He continues Advair 250 twice daily, and uses pro air occasionally. His fluoxetine antidepressant dose was increased to 40 mg.  09/16/12- 71 yoM followed for asthma, allergic rhinitis FOLLOWS FOR: pt reports breathing is doing well-- denies any other concerns at this time Continues allergy vaccine 1:10 GO. Complains of feeling "tired" but admits it is probably depression since wife died 2 years ago. He says he wants to "retreat into sleep because I am lonely". He avoids caffeine because of polyuria. Allergic nasal congestion has been better with allergy vaccine but he still feels tired if his nose is stopped up. No significant asthma attacks since 1998.  ROS-see HPI Constitutional:   No-   weight loss, night sweats, fevers, chills, fatigue, +lassitude. HEENT:   No-  headaches, difficulty swallowing, tooth/dental problems, sore throat,       No-  sneezing, itching, ear ache, +nasal congestion, post nasal drip,  CV:  No-   chest  pain, orthopnea, PND, swelling in lower extremities, anasarca, dizziness, palpitations Resp: No-   shortness of breath with exertion or at rest.  No- Chest tightness            No-   productive cough,  No non-productive cough,  No- coughing up of blood.              No-   change in color of mucus.  No- wheezing.   Skin: No-   rash or lesions. GI:  No-   heartburn, indigestion, abdominal pain, nausea, vomiting,  GU:  MS:  No-   joint pain or swelling.   Neuro-     nothing unusual Psych:  No- change in mood or affect. + depression or anxiety.  No memory loss.  Objective:   Physical Exam General- Alert, Oriented, Affect-appropriate, Distress- none acute Skin- rash-none, lesions- none, excoriation- none Lymphadenopathy- none Head- atraumatic            Eyes- Gross vision intact, PERRLA, conjunctivae clear secretions            Ears- Hearing, canals            Nose- Clear, No-Septal dev, mucus, polyps, erosion, perforation             Throat- Mallampati III, mucosa clear , drainage- none, tonsils- atrophic,  hoarse Neck- flexible , trachea midline, no stridor , thyroid nl, carotid no bruit Chest - symmetrical excursion , unlabored           Heart/CV- RRR , no murmur , no gallop  ,  no rub, nl s1 s2                           - JVD- none , edema- none, stasis changes- none, varices- none           Lung- + coarse breath sounds, unlabored, wheeze- none, cough- raspy, dullness-none, rub- none           Chest wall-  Abd-  Br/ Gen/ Rectal- Not done, not indicated Extrem- cyanosis- none, clubbing, none, atrophy- none, strength- nl Neuro- grossly intact to observation

## 2012-09-16 NOTE — Patient Instructions (Addendum)
Refills sent for Advair and Epipen Refills printed for Xanax and Proair  We can continue allergy vaccine  Please call as needed

## 2012-09-27 ENCOUNTER — Other Ambulatory Visit: Payer: Self-pay | Admitting: Family Medicine

## 2012-10-01 NOTE — Assessment & Plan Note (Signed)
Well controlled with Advair and rescue inhaler plus allergy vaccine

## 2012-10-01 NOTE — Assessment & Plan Note (Signed)
He feels he has done well with allergy vaccine. We discussed wearing a mask to [employer]. Plan-refill meds including EpiPen. Risk-benefit of allergy vaccine considered

## 2012-10-18 ENCOUNTER — Encounter: Payer: Self-pay | Admitting: Family Medicine

## 2012-10-18 ENCOUNTER — Ambulatory Visit (INDEPENDENT_AMBULATORY_CARE_PROVIDER_SITE_OTHER): Payer: Medicare Other | Admitting: Family Medicine

## 2012-10-18 ENCOUNTER — Ambulatory Visit (INDEPENDENT_AMBULATORY_CARE_PROVIDER_SITE_OTHER): Payer: Medicare Other

## 2012-10-18 VITALS — BP 148/90 | HR 80 | Temp 97.8°F | Wt 198.8 lb

## 2012-10-18 DIAGNOSIS — M542 Cervicalgia: Secondary | ICD-10-CM

## 2012-10-18 DIAGNOSIS — M5030 Other cervical disc degeneration, unspecified cervical region: Secondary | ICD-10-CM

## 2012-10-18 MED ORDER — MELOXICAM 15 MG PO TABS: 15.0000 mg | ORAL_TABLET | Freq: Every day | ORAL | Status: DC

## 2012-10-18 NOTE — Progress Notes (Signed)
Patient ID: [name], male   DOB: 02/08/1941, 72 y.o.   MRN: [id number] SUBJECTIVE: CC: Chief Complaint  Patient presents with  . Follow-up    neck sore since friday     HPI: Has a stiff and sore neck. Last Thursday night lied down had a twinge. Next morning has had a sore neck. 3 days ago neck pain was so bad he couldn't lie down. Had to use a firmer pillow. Has a Rx for prednisone from Pulmonary for asthma. Used it and took 40 mg Sunday and Monday. Today took 20 mg.  Today called to be seen.    Past Medical History  Diagnosis Date  . Irritable bowel syndrome   . Allergic rhinitis, cause unspecified   . Unspecified asthma(493.90)   . Hyperlipidemia   . Hypothyroidism   . Kidney stones   . Obesity    Past Surgical History  Procedure Laterality Date  . Tonsillectomy    . Vasectomy    . Rotator cuff repair      Left Shoulder  . Cataract extraction bilateral w/ anterior vitrectomy    . Transurethral resection of prostate     History   Social History  . Marital Status: Single    Spouse Name: N/A    Number of Children: N/A  . Years of Education: N/A   Occupational History  . retired    Social History Main Topics  . Smoking status: Former Smoker    Types: Cigarettes    Quit date: 02/24/1972  . Smokeless tobacco: Never Used  . Alcohol Use: No  . Drug Use: No  . Sexual Activity: Not on file   Other Topics Concern  . Not on file   Social History Narrative  . No narrative on file   Family History  Problem Relation Age of Onset  . Breast cancer Mother   . Diabetes Father   . Heart disease Father    Current Outpatient Prescriptions on File Prior to Visit  Medication Sig Dispense Refill  . albuterol (PROAIR HFA) 108 (90 BASE) MCG/ACT inhaler 2 puffs every 4 hours if needed- rescue inhaler  8.5 each  prn  . ALPRAZolam (XANAX) 0.5 MG tablet Take 1 tablet (0.5 mg total) by mouth 3 (three) times daily as needed for sleep or anxiety.  60 tablet  5  .  aspirin 81 MG tablet Take 81 mg by mouth daily.      . Cholecalciferol (VITAMIN D) 2000 UNITS CAPS Take 1 capsule by mouth daily.        . Coenzyme Q10 (CO Q 10) 100 MG CAPS Take 1 capsule by mouth daily.      Marland Kitchen EPINEPHrine (EPI-PEN) 0.3 mg/0.3 mL SOAJ Inject 0.3 mLs (0.3 mg total) into the muscle once.  1 Device  prn  . FLUoxetine (PROZAC) 40 MG capsule Take 1 capsule (40 mg total) by mouth daily.  90 capsule  3  . Fluticasone-Salmeterol (ADVAIR) 250-50 MCG/DOSE AEPB Inhale 1 puff into the lungs every 12 (twelve) hours.  60 each  prn  . levothyroxine (SYNTHROID, LEVOTHROID) 75 MCG tablet Take 75 mcg by mouth daily.        . Multiple Vitamin (MULTIVITAMIN) tablet Take 1 tablet by mouth daily.        . simvastatin (ZOCOR) 20 MG tablet TAKE 1 TABLET AT BEDTIME  30 tablet  3   No current facility-administered medications on file prior to visit.   No Known Allergies Immunization History  Administered Date(s)  Administered  . Influenza Split 10/25/2011  . Influenza Whole 10/25/2010  . Pneumococcal Polysaccharide 09/17/2011   Prior to Admission medications   Medication Sig Start Date End Date Taking? Authorizing Provider  albuterol (PROAIR HFA) 108 (90 BASE) MCG/ACT inhaler 2 puffs every 4 hours if needed- rescue inhaler 09/16/12  Yes Waymon Budge, MD  ALPRAZolam Prudy Feeler) 0.5 MG tablet Take 1 tablet (0.5 mg total) by mouth 3 (three) times daily as needed for sleep or anxiety. 09/16/12  Yes Waymon Budge, MD  aspirin 81 MG tablet Take 81 mg by mouth daily.   Yes Historical Provider, MD  Cholecalciferol (VITAMIN D) 2000 UNITS CAPS Take 1 capsule by mouth daily.     Yes Historical Provider, MD  Coenzyme Q10 (CO Q 10) 100 MG CAPS Take 1 capsule by mouth daily.   Yes Historical Provider, MD  EPINEPHrine (EPI-PEN) 0.3 mg/0.3 mL SOAJ Inject 0.3 mLs (0.3 mg total) into the muscle once. 09/16/12  Yes Waymon Budge, MD  FLUoxetine (PROZAC) 40 MG capsule Take 1 capsule (40 mg total) by mouth daily.  05/20/12  Yes Ileana Ladd, MD  Fluticasone-Salmeterol (ADVAIR) 250-50 MCG/DOSE AEPB Inhale 1 puff into the lungs every 12 (twelve) hours. 09/16/12 09/17/14 Yes Clinton D Young, MD  levothyroxine (SYNTHROID, LEVOTHROID) 75 MCG tablet Take 75 mcg by mouth daily.     Yes Historical Provider, MD  Multiple Vitamin (MULTIVITAMIN) tablet Take 1 tablet by mouth daily.     Yes Historical Provider, MD  simvastatin (ZOCOR) 20 MG tablet TAKE 1 TABLET AT BEDTIME 09/27/12  Yes Ileana Ladd, MD     ROS: As above in the HPI. All other systems are stable or negative.  OBJECTIVE: APPEARANCE:  Patient in no acute distress.The patient appeared well nourished and normally developed. Acyanotic. Waist: VITAL SIGNS:BP 148/90  Pulse 80  Temp(Src) 97.8 F (36.6 C) (Oral)  Wt 198 lb 12.8 oz (90.175 kg)  BMI 28.95 kg/m2 WM  SKIN: warm and  Dry without overt rashes, tattoos and scars  HEAD and Neck: without JVD, markedly reduced ROM in all areas except flexion of the neck. Reduced to 30% of FROM. Pain reproduced. Head and scalp: normal Eyes:No scleral icterus. Fundi normal, eye movements normal. Ears: Auricle normal, canal normal, Tympanic membranes normal, insufflation normal. Nose: normal Throat: normal Neck & thyroid: neck has markedly reduced ROM of the neck except for the flexion movement, which is normal.   CHEST & LUNGS: Chest wall: normal Lungs: Clear  CVS: Reveals the PMI to be normally located. Regular rhythm, First and Second Heart sounds are normal,  absence of murmurs, rubs or gallops. Peripheral vasculature: Radial pulses: normal Dorsal pedis pulses: normal Posterior pulses: normal  ABDOMEN:  Appearance: normal Benign, no organomegaly, no masses, no Abdominal Aortic enlargement. No Guarding , no rebound. No Bruits. Bowel sounds: normal  RECTAL: N/A GU: N/A  EXTREMETIES: nonedematous.  MUSCULOSKELETAL:  Spine: normal Joints: intact  NEUROLOGIC: oriented to time,place  and person; nonfocal. Strength is normal Sensory is normal Reflexes are normal Cranial Nerves are normal.  ASSESSMENT: Neck pain - Plan: DG Cervical Spine Complete, meloxicam (MOBIC) 15 MG tablet, Ambulatory referral to Physical Therapy  DDD (degenerative disc disease), cervical - Plan: meloxicam (MOBIC) 15 MG tablet, Ambulatory referral to Physical Therapy   PLAN:  [facility] reading (PRIMARY) by  Dr. Modesto Charon:  Marked DDD and Degenerative changes C4-C7.  Orders Placed This Encounter  Procedures  . DG Cervical Spine Complete    Standing Status: Future     Number of Occurrences: 1     Standing Expiration Date: 12/18/2013    Order Specific Question:  Reason for Exam (SYMPTOM  OR DIAGNOSIS REQUIRED)    Answer:  neck pain    Order Specific Question:  Preferred imaging location?    Answer:  Internal  . Ambulatory referral to Physical Therapy    Referral Priority:  Routine    Referral Type:  Physical Medicine    Referral Reason:  Specialty Services Required    Requested Specialty:  Physical Therapy    Number of Visits Requested:  1    Meds ordered this encounter  Medications  . meloxicam (MOBIC) 15 MG tablet    Sig: Take 1 tablet (15 mg total) by mouth daily.    Dispense:  30 tablet    Refill:  1    Results for orders placed in visit on 07/22/12  COMPLETE METABOLIC PANEL WITH GFR      Result Value Range   Sodium 140  135 - 145 mEq/L   Potassium 4.1  3.5 - 5.3 mEq/L   Chloride 104  96 - 112 mEq/L   CO2 25  19 - 32 mEq/L   Glucose, Bld 97  70 - 99 mg/dL   BUN 9  6 - 23 mg/dL   Creat 9.14  7.82 - 9.56 mg/dL   Total Bilirubin 0.7  0.3 - 1.2 mg/dL   Alkaline Phosphatase 68  39 - 117 U/L   AST 28  0 - 37 U/L   ALT 28  0 - 53 U/L   Total Protein 7.0  6.0 - 8.3 g/dL   Albumin 4.7  3.5 - 5.2 g/dL   Calcium 8.9  8.4 - 21.3 mg/dL   GFR, Est African American >89     GFR, Est Non African American >89    NMR LIPOPROFILE WITH LIPIDS      Result Value Range    LDL Particle Number 1088 (*) <1000 nmol/L   LDL (calc) 55  <100 mg/dL   HDL-C 40  >=08 mg/dL   Triglycerides 657  <846 mg/dL   Cholesterol, Total 962  <200 mg/dL   HDL Particle Number 95.2  >=84.1 umol/L   Large HDL-P <1.3 (*) >=4.8 umol/L   Large VLDL-P 0.8  <=2.7 nmol/L   Small LDL Particle Number 741 (*) <=527 nmol/L   LDL Size 20.1 (*) >20.5 nm   HDL Size 8.4 (*) >=9.2 nm   VLDL Size 45.6  <=46.6 nm   LP-IR Score 53 (*) <=45  TSH      Result Value Range   TSH 2.313  0.350 - 4.500 uIU/mL  VITAMIN D 25 HYDROXY      Result Value Range   Vit D, 25-Hydroxy 51  30 - 89 ng/mL   Return in about 4 weeks (around 11/15/2012) for Recheck medical problems.  Kyna Blahnik P. [place] Charon, M.D.

## 2012-10-25 ENCOUNTER — Ambulatory Visit: Payer: Medicare Other | Attending: Family Medicine | Admitting: Physical Therapy

## 2012-10-25 DIAGNOSIS — R293 Abnormal posture: Secondary | ICD-10-CM | POA: Insufficient documentation

## 2012-10-25 DIAGNOSIS — M2560 Stiffness of unspecified joint, not elsewhere classified: Secondary | ICD-10-CM | POA: Insufficient documentation

## 2012-10-25 DIAGNOSIS — M542 Cervicalgia: Secondary | ICD-10-CM | POA: Insufficient documentation

## 2012-10-25 DIAGNOSIS — IMO0001 Reserved for inherently not codable concepts without codable children: Secondary | ICD-10-CM | POA: Insufficient documentation

## 2012-10-27 ENCOUNTER — Ambulatory Visit: Payer: Medicare Other | Admitting: *Deleted

## 2012-11-01 ENCOUNTER — Ambulatory Visit (INDEPENDENT_AMBULATORY_CARE_PROVIDER_SITE_OTHER): Payer: Medicare Other

## 2012-11-01 ENCOUNTER — Ambulatory Visit: Payer: Medicare Other | Admitting: *Deleted

## 2012-11-01 DIAGNOSIS — J309 Allergic rhinitis, unspecified: Secondary | ICD-10-CM

## 2012-11-03 ENCOUNTER — Ambulatory Visit: Payer: Medicare Other

## 2012-11-08 ENCOUNTER — Ambulatory Visit: Payer: Medicare Other | Admitting: *Deleted

## 2012-11-11 ENCOUNTER — Ambulatory Visit: Payer: Medicare Other | Admitting: *Deleted

## 2012-11-15 ENCOUNTER — Ambulatory Visit (INDEPENDENT_AMBULATORY_CARE_PROVIDER_SITE_OTHER): Payer: Medicare Other | Admitting: Family Medicine

## 2012-11-15 ENCOUNTER — Encounter: Payer: Self-pay | Admitting: Family Medicine

## 2012-11-15 VITALS — BP 140/85 | HR 95 | Temp 97.6°F | Ht 69.0 in | Wt 206.8 lb

## 2012-11-15 DIAGNOSIS — Z5181 Encounter for therapeutic drug level monitoring: Secondary | ICD-10-CM

## 2012-11-15 DIAGNOSIS — Z23 Encounter for immunization: Secondary | ICD-10-CM

## 2012-11-15 DIAGNOSIS — M47812 Spondylosis without myelopathy or radiculopathy, cervical region: Secondary | ICD-10-CM

## 2012-11-15 NOTE — Progress Notes (Signed)
Patient ID: [name], male   DOB: 1940-11-18, 72 y.o.   MRN: [id number] SUBJECTIVE: CC: Chief Complaint  Patient presents with  . Follow-up    4 week follow up     HPI: Here for follow up on his neck. Had PT on his neck. It is  Better.the meloxicam and PT helped but after a while it didn't help anymore. At the best it can be. Due for a PEX in December.   Past Medical History  Diagnosis Date  . Irritable bowel syndrome   . Allergic rhinitis, cause unspecified   . Unspecified asthma(493.90)   . Hyperlipidemia   . Hypothyroidism   . Kidney stones   . Obesity    Past Surgical History  Procedure Laterality Date  . Tonsillectomy    . Vasectomy    . Rotator cuff repair      Left Shoulder  . Cataract extraction bilateral w/ anterior vitrectomy    . Transurethral resection of prostate     History   Social History  . Marital Status: Single    Spouse Name: N/A    Number of Children: N/A  . Years of Education: N/A   Occupational History  . retired    Social History Main Topics  . Smoking status: Former Smoker    Types: Cigarettes    Quit date: 02/24/1972  . Smokeless tobacco: Never Used  . Alcohol Use: No  . Drug Use: No  . Sexual Activity: Not on file   Other Topics Concern  . Not on file   Social History Narrative  . No narrative on file   Family History  Problem Relation Age of Onset  . Breast cancer Mother   . Diabetes Father   . Heart disease Father    Current Outpatient Prescriptions on File Prior to Visit  Medication Sig Dispense Refill  . albuterol (PROAIR HFA) 108 (90 BASE) MCG/ACT inhaler 2 puffs every 4 hours if needed- rescue inhaler  8.5 each  prn  . ALPRAZolam (XANAX) 0.5 MG tablet Take 1 tablet (0.5 mg total) by mouth 3 (three) times daily as needed for sleep or anxiety.  60 tablet  5  . Cholecalciferol (VITAMIN D) 2000 UNITS CAPS Take 1 capsule by mouth daily.        . Coenzyme Q10 (CO Q 10) 100 MG CAPS Take 1 capsule by mouth daily.       Marland Kitchen EPINEPHrine (EPI-PEN) 0.3 mg/0.3 mL SOAJ Inject 0.3 mLs (0.3 mg total) into the muscle once.  1 Device  prn  . FLUoxetine (PROZAC) 40 MG capsule Take 1 capsule (40 mg total) by mouth daily.  90 capsule  3  . Fluticasone-Salmeterol (ADVAIR) 250-50 MCG/DOSE AEPB Inhale 1 puff into the lungs every 12 (twelve) hours.  60 each  prn  . levothyroxine (SYNTHROID, LEVOTHROID) 75 MCG tablet Take 75 mcg by mouth daily.        . meloxicam (MOBIC) 15 MG tablet Take 1 tablet (15 mg total) by mouth daily.  30 tablet  1  . Multiple Vitamin (MULTIVITAMIN) tablet Take 1 tablet by mouth daily.        . simvastatin (ZOCOR) 20 MG tablet TAKE 1 TABLET AT BEDTIME  30 tablet  3  . aspirin 81 MG tablet Take 81 mg by mouth daily.       No current facility-administered medications on file prior to visit.   No Known Allergies Immunization History  Administered Date(s) Administered  . Influenza Split 10/25/2011  .  Influenza Whole 10/25/2010  . Pneumococcal Polysaccharide 09/17/2011   Prior to Admission medications   Medication Sig Start Date End Date Taking? Authorizing Provider  albuterol (PROAIR HFA) 108 (90 BASE) MCG/ACT inhaler 2 puffs every 4 hours if needed- rescue inhaler 09/16/12  Yes Waymon Budge, MD  ALPRAZolam Prudy Feeler) 0.5 MG tablet Take 1 tablet (0.5 mg total) by mouth 3 (three) times daily as needed for sleep or anxiety. 09/16/12  Yes Waymon Budge, MD  Cholecalciferol (VITAMIN D) 2000 UNITS CAPS Take 1 capsule by mouth daily.     Yes Historical Provider, MD  Coenzyme Q10 (CO Q 10) 100 MG CAPS Take 1 capsule by mouth daily.   Yes Historical Provider, MD  EPINEPHrine (EPI-PEN) 0.3 mg/0.3 mL SOAJ Inject 0.3 mLs (0.3 mg total) into the muscle once. 09/16/12  Yes Waymon Budge, MD  FLUoxetine (PROZAC) 40 MG capsule Take 1 capsule (40 mg total) by mouth daily. 05/20/12  Yes Ileana Ladd, MD  Fluticasone-Salmeterol (ADVAIR) 250-50 MCG/DOSE AEPB Inhale 1 puff into the lungs every 12 (twelve) hours.  09/16/12 09/17/14 Yes Clinton D Young, MD  levothyroxine (SYNTHROID, LEVOTHROID) 75 MCG tablet Take 75 mcg by mouth daily.     Yes Historical Provider, MD  meloxicam (MOBIC) 15 MG tablet Take 1 tablet (15 mg total) by mouth daily. 10/18/12  Yes Ileana Ladd, MD  Multiple Vitamin (MULTIVITAMIN) tablet Take 1 tablet by mouth daily.     Yes Historical Provider, MD  simvastatin (ZOCOR) 20 MG tablet TAKE 1 TABLET AT BEDTIME 09/27/12  Yes Ileana Ladd, MD  aspirin 81 MG tablet Take 81 mg by mouth daily.    Historical Provider, MD     ROS: As above in the HPI. All other systems are stable or negative.  OBJECTIVE: APPEARANCE:  Patient in no acute distress.The patient appeared well nourished and normally developed. Acyanotic. Waist: VITAL SIGNS:BP 140/85  Pulse 95  Temp(Src) 97.6 F (36.4 C) (Oral)  Ht 5\' 9"  (1.753 m)  Wt 206 lb 12.8 oz (93.804 kg)  BMI 30.53 kg/m2 WM  SKIN: warm and  Dry without overt rashes, tattoos and scars  HEAD and Neck: without JVD, Head and scalp: normal Eyes:No scleral icterus. Fundi normal, eye movements normal. Ears: Auricle normal, canal normal, Tympanic membranes normal, insufflation normal. Nose: normal Throat: normal thyroid: normal NECK: improved range of motion. But not at maximum.  CHEST & LUNGS: Chest wall: normal Lungs: Clear  CVS: Reveals the PMI to be normally located. Regular rhythm, First and Second Heart sounds are normal,  absence of murmurs, rubs or gallops. Peripheral vasculature: Radial pulses: normal Dorsal pedis pulses: normal Posterior pulses: normal  ABDOMEN:  Appearance: normal Benign, no organomegaly, no masses, no Abdominal Aortic enlargement. No Guarding , no rebound. No Bruits. Bowel sounds: normal  RECTAL: N/A GU: N/A  EXTREMETIES: nonedematous.  MUSCULOSKELETAL:  Spine: cervical spine has improved ROM but less than 80 degrees. Joints: intact  NEUROLOGIC: oriented to time,place and person;  nonfocal. Strength is normal Sensory is normal Reflexes are normal Cranial Nerves are normal.  ASSESSMENT: Degenerative arthritis of cervical spine  Medication monitoring encounter - Plan: CMP14+EGFR  PLAN: Orders Placed This Encounter  Procedures  . CMP14+EGFR    D/c any further PT  Same meds. Use the meloxicam prn  No Follow-up on file. Has appointment for December.  Menachem Urbanek P. [place] Charon, M.D.

## 2012-11-16 LAB — CMP14+EGFR
ALT: 31 IU/L (ref 0–44)
AST: 30 IU/L (ref 0–40)
Albumin/Globulin Ratio: 2.1 (ref 1.1–2.5)
Albumin: 4.5 g/dL (ref 3.5–4.8)
Alkaline Phosphatase: 73 IU/L (ref 39–117)
BUN/Creatinine Ratio: 23 — ABNORMAL HIGH (ref 10–22)
BUN: 18 mg/dL (ref 8–27)
CO2: 26 mmol/L (ref 18–29)
Calcium: 9.3 mg/dL (ref 8.6–10.2)
Chloride: 100 mmol/L (ref 97–108)
Creatinine, Ser: 0.79 mg/dL (ref 0.76–1.27)
GFR calc Af Amer: 104 mL/min/{1.73_m2} (ref >59)
GFR calc non Af Amer: 90 mL/min/{1.73_m2} (ref >59)
Globulin, Total: 2.1 g/dL (ref 1.5–4.5)
Glucose: 95 mg/dL (ref 65–99)
Potassium: 4.4 mmol/L (ref 3.5–5.2)
Sodium: 141 mmol/L (ref 134–144)
Total Bilirubin: 0.4 mg/dL (ref 0.0–1.2)
Total Protein: 6.6 g/dL (ref 6.0–8.5)

## 2012-11-16 NOTE — Progress Notes (Signed)
Quick Note:  Call patient. Labs normal. No change in plan. ______ 

## 2012-11-29 ENCOUNTER — Ambulatory Visit (INDEPENDENT_AMBULATORY_CARE_PROVIDER_SITE_OTHER): Payer: Medicare Other | Admitting: Nurse Practitioner

## 2012-11-29 ENCOUNTER — Encounter: Payer: Self-pay | Admitting: Nurse Practitioner

## 2012-11-29 VITALS — BP 147/98 | HR 71 | Temp 97.7°F | Ht 69.0 in | Wt 200.0 lb

## 2012-11-29 DIAGNOSIS — S60511A Abrasion of right hand, initial encounter: Secondary | ICD-10-CM

## 2012-11-29 DIAGNOSIS — W64XXXA Exposure to other animate mechanical forces, initial encounter: Secondary | ICD-10-CM

## 2012-11-29 DIAGNOSIS — IMO0002 Reserved for concepts with insufficient information to code with codable children: Secondary | ICD-10-CM

## 2012-11-29 MED ORDER — AMOXICILLIN-POT CLAVULANATE ER 1000-62.5 MG PO TB12: 2.0000 | ORAL_TABLET | Freq: Two times a day (BID) | ORAL | Status: DC

## 2012-11-29 NOTE — Progress Notes (Signed)
  Subjective:    Patient ID: [name], male    DOB: 16-Jan-1941, 72 y.o.   MRN: [id number]  HPI  Patient was out in is barn and came across a stray cat-He reached down to touch cat and the cat clawed his right hand- this  happened yesterday- knuckles are slightly red.    Review of Systems  All other systems reviewed and are negative.       Objective:   Physical Exam  Constitutional: He appears well-developed and well-nourished.  Cardiovascular: Normal rate, regular rhythm and normal heart sounds.   Pulmonary/Chest: Effort normal and breath sounds normal.  Skin: Skin is warm. There is erythema.  Knuckles on right han dare swollen and erythematous with 2 small claw marks on right middle finger.    BP 147/98  Pulse 71  Temp(Src) 97.7 F (36.5 C) (Oral)  Ht 5\' 9"  (1.753 m)  Wt 200 lb (90.719 kg)  BMI 29.52 kg/m2       Assessment & Plan:  1. Cat scratch of hand, right, initial encounter Soak in epsom salt BID RTO if not improving - amoxicillin-clavulanate (AUGMENTIN XR) 1000-62.5 MG per tablet; Take 2 tablets by mouth 2 (two) times daily.  Dispense: 40 tablet; Refill: 0  Mary-Margaret Daphine Deutscher, FNP

## 2012-11-29 NOTE — Patient Instructions (Signed)
Cat Scratch Disease °Cats often injure people by scratching or biting. This site of injury can become infected with a particular germ or bacteria present in the mouth of or on the cat. This germ is called Bartonella henselae. This infection is identified by the common name cat scratch disease (CSD).  °SYMPTOMS °· A red and sore pimple or bump, with or without pus, on the skin where the cat scratched or bit. The pimple or sore may be present for as long as three weeks after the scratch or bite occurred. °· One or more enlarged lymph glands located toward the center of the body from where the injury occurred. °· Less common symptoms include low-grade fever, tiredness, fatigue, headache and/or sore throat. °DIAGNOSIS °· The diagnosis is typically made by your caregiver who notes the history of a scratch or bite from a cat, and finds the skin sore and swollen lymph glands in the described area. °· Culture of any drainage or pus from the injury site, or a needle aspiration or piece of tissue (biopsy) from a swollen lymph gland may also be done to confirm the diagnosis and assure that a different infection or disease is not causing your illness. °Rare but serious complications may occur, they include: °· Parinaud's syndrome - fever, swollen lymph glands and inflammation of the eye (conjunctivitis). °· Infection of the brain (encephalitis). °· Infection of the nerve of the eye (neuroretinitis). °· Infection of the bone (osteomyelitis). °TREATMENT °· Usually treatment is not necessary or helpful, especially if you have a normal immune system. When infection is very severe, it may be treated with a medicine that kills the bacteria (antibiotic). °· People with immune system problems (such as having AIDS or an organ transplant, or being on steroids or other immune modifying drugs) should be treated with antibiotics. °HOME CARE INSTRUCTIONS  °· Avoid injury while playing with cats. °· Wash well after playing with cats. °· Do  not let your cat lick sores on your body. °· Do not let your cat roam around outside of your house. °· Keep the area of the cat scratch clean. Wash it with soap and water or apply an antiseptic solution such as povidone iodine. °· You should get a tetanus shot if you have not had one in the past 5 or 10 years. If you receive one, your arm may get swollen and red and warm to the touch at the shot site. This is a common response to the medication in the shot. If you did not receive a tetanus shot here because you did not recall when your last one was given, make sure to check with your caregiver's office and determine if one is needed. Generally, for a "dirty" wound, you should receive a tetanus booster if you have not had one in the last five years. If you have a "clean" wound, you should receive a tetanus booster if you have not had one in the last ten years. °SEEK IMMEDIATE MEDICAL CARE IF:  °· You have worsening signs of infection, such as more redness, increased pain, red streaking or pus coming from the wound, or warmth or swelling around the area of the scratch. °· You develop worsening swollen lymph glands. °· You develop abdominal pain, have problems with your vision or develop a skin rash. °· You have a fever. °· You become more tired or dizzy, or have a worsening headache. °· You develop inflammation of your eye or have increasing vision problems. °· You have pain in   one of your bones. °· You develop a stiff neck. °· You pass out. °MAKE SURE YOU:  °· Understand these instructions. °· Will watch your condition. °· Will get help right away if you are not doing well or get worse. °Document Released: 02/07/2000 Document Revised: 05/04/2011 Document Reviewed: 03/21/2008 °Exit[employer]® Patient Information ©2014 [employer]. ° °

## 2012-12-12 ENCOUNTER — Telehealth: Payer: Self-pay | Admitting: Family Medicine

## 2012-12-12 NOTE — Telephone Encounter (Signed)
Appt given

## 2012-12-20 ENCOUNTER — Ambulatory Visit (INDEPENDENT_AMBULATORY_CARE_PROVIDER_SITE_OTHER): Payer: Medicare Other | Admitting: Family Medicine

## 2012-12-20 ENCOUNTER — Encounter: Payer: Self-pay | Admitting: Family Medicine

## 2012-12-20 ENCOUNTER — Other Ambulatory Visit: Payer: Self-pay | Admitting: Family Medicine

## 2012-12-20 VITALS — BP 138/82 | HR 72 | Temp 98.2°F | Ht 69.0 in | Wt 200.4 lb

## 2012-12-20 DIAGNOSIS — R51 Headache: Secondary | ICD-10-CM

## 2012-12-20 DIAGNOSIS — R519 Headache, unspecified: Secondary | ICD-10-CM | POA: Insufficient documentation

## 2012-12-20 DIAGNOSIS — M5412 Radiculopathy, cervical region: Secondary | ICD-10-CM | POA: Insufficient documentation

## 2012-12-20 DIAGNOSIS — M47812 Spondylosis without myelopathy or radiculopathy, cervical region: Secondary | ICD-10-CM

## 2012-12-20 NOTE — Progress Notes (Signed)
Patient ID: [name], male   DOB: [birth year], 72 y.o.   MRN: [id number] SUBJECTIVE: CC: Chief Complaint  Patient presents with  . Acute Visit    headaches  onset 4 weeks ago started ater taking meloxicalm. States the headache has resolved now , but occ a dull ache     HPI: Headache at the top of his head.was daily for 2 to 3 weeks and now is intermittent. Thought it was his neck problems. His neck continues to be sore. Cannot turn around his head.   Past Medical History  Diagnosis Date  . Irritable bowel syndrome   . Allergic rhinitis, cause unspecified   . Unspecified asthma(493.90)   . Hyperlipidemia   . Hypothyroidism   . Kidney stones   . Obesity    Past Surgical History  Procedure Laterality Date  . Tonsillectomy    . Vasectomy    . Rotator cuff repair      Left Shoulder  . Cataract extraction bilateral w/ anterior vitrectomy    . Transurethral resection of prostate     History   Social History  . Marital Status: Unknown    Spouse Name: N/A    Number of Children: N/A  . Years of Education: N/A   Occupational History  . retired    Social History Main Topics  . Smoking status: Former Smoker    Types: Cigarettes    Quit date: 02/24/1972  . Smokeless tobacco: Never Used  . Alcohol Use: No  . Drug Use: No  . Sexual Activity: Not on file   Other Topics Concern  . Not on file   Social History Narrative  . No narrative on file   Family History  Problem Relation Age of Onset  . Breast cancer Mother   . Diabetes Father   . Heart disease Father    Current Outpatient Prescriptions on File Prior to Visit  Medication Sig Dispense Refill  . albuterol (PROAIR HFA) 108 (90 BASE) MCG/ACT inhaler 2 puffs every 4 hours if needed- rescue inhaler  8.5 each  prn  . ALPRAZolam (XANAX) 0.5 MG tablet Take 1 tablet (0.5 mg total) by mouth 3 (three) times daily as needed for sleep or anxiety.  60 tablet  5  . aspirin 81 MG tablet Take 81 mg by mouth daily.      .  Cholecalciferol (VITAMIN D) 2000 UNITS CAPS Take 1 capsule by mouth daily.        . Coenzyme Q10 (CO Q 10) 100 MG CAPS Take 1 capsule by mouth daily.      Marland Kitchen EPINEPHrine (EPI-PEN) 0.3 mg/0.3 mL SOAJ Inject 0.3 mLs (0.3 mg total) into the muscle once.  1 Device  prn  . FLUoxetine (PROZAC) 40 MG capsule Take 1 capsule (40 mg total) by mouth daily.  90 capsule  3  . Fluticasone-Salmeterol (ADVAIR) 250-50 MCG/DOSE AEPB Inhale 1 puff into the lungs every 12 (twelve) hours.  60 each  prn  . levothyroxine (SYNTHROID, LEVOTHROID) 75 MCG tablet Take 75 mcg by mouth daily.        . meloxicam (MOBIC) 15 MG tablet Take 1 tablet (15 mg total) by mouth daily.  30 tablet  1  . simvastatin (ZOCOR) 20 MG tablet TAKE 1 TABLET AT BEDTIME  30 tablet  3  . amoxicillin-clavulanate (AUGMENTIN XR) 1000-62.5 MG per tablet Take 2 tablets by mouth 2 (two) times daily.  40 tablet  0  . Multiple Vitamin (MULTIVITAMIN) tablet Take 1 tablet by  mouth daily.         No current facility-administered medications on file prior to visit.   No Known Allergies Immunization History  Administered Date(s) Administered  . Influenza Split 10/25/2011  . Influenza Whole 10/25/2010  . Influenza,inj,Quad PF,36+ Mos 11/15/2012  . Pneumococcal Polysaccharide 09/17/2011   Prior to Admission medications   Medication Sig Start Date End Date Taking? Authorizing Provider  albuterol (PROAIR HFA) 108 (90 BASE) MCG/ACT inhaler 2 puffs every 4 hours if needed- rescue inhaler 09/16/12  Yes Waymon Budge, MD  ALPRAZolam Prudy Feeler) 0.5 MG tablet Take 1 tablet (0.5 mg total) by mouth 3 (three) times daily as needed for sleep or anxiety. 09/16/12  Yes Waymon Budge, MD  aspirin 81 MG tablet Take 81 mg by mouth daily.   Yes Historical Provider, MD  Cholecalciferol (VITAMIN D) 2000 UNITS CAPS Take 1 capsule by mouth daily.     Yes Historical Provider, MD  Coenzyme Q10 (CO Q 10) 100 MG CAPS Take 1 capsule by mouth daily.   Yes Historical Provider, MD   EPINEPHrine (EPI-PEN) 0.3 mg/0.3 mL SOAJ Inject 0.3 mLs (0.3 mg total) into the muscle once. 09/16/12  Yes Waymon Budge, MD  FLUoxetine (PROZAC) 40 MG capsule Take 1 capsule (40 mg total) by mouth daily. 05/20/12  Yes Ileana Ladd, MD  Fluticasone-Salmeterol (ADVAIR) 250-50 MCG/DOSE AEPB Inhale 1 puff into the lungs every 12 (twelve) hours. 09/16/12 09/17/14 Yes Clinton D Young, MD  levothyroxine (SYNTHROID, LEVOTHROID) 75 MCG tablet Take 75 mcg by mouth daily.     Yes Historical Provider, MD  meloxicam (MOBIC) 15 MG tablet Take 1 tablet (15 mg total) by mouth daily. 10/18/12  Yes Ileana Ladd, MD  simvastatin (ZOCOR) 20 MG tablet TAKE 1 TABLET AT BEDTIME 09/27/12  Yes Ileana Ladd, MD  amoxicillin-clavulanate (AUGMENTIN XR) 1000-62.5 MG per tablet Take 2 tablets by mouth 2 (two) times daily. 11/29/12   Mary-Margaret Daphine Deutscher, FNP  Multiple Vitamin (MULTIVITAMIN) tablet Take 1 tablet by mouth daily.      Historical Provider, MD     ROS: As above in the HPI. All other systems are stable or negative.  OBJECTIVE: APPEARANCE:  Patient in no acute distress.The patient appeared well nourished and normally developed. Acyanotic. Waist: VITAL SIGNS:BP 138/82  Pulse 72  Temp(Src) 98.2 F (36.8 C) (Oral)  Ht 5\' 9"  (1.753 m)  Wt 200 lb 6.4 oz (90.901 kg)  BMI 29.58 kg/m2  WM  SKIN: warm and  Dry without overt rashes, tattoos and scars  HEAD and Neck: without JVD, Head and scalp: normal Eyes:No scleral icterus. Fundi normal, eye movements normal. Ears: Auricle normal, canal normal, Tympanic membranes normal, insufflation normal. Nose: normal Throat: normal Neck: more reduced ROM of the cervical spine. Radicular pain. To shoulder and up the back of the head.  thyroid: normal  CHEST & LUNGS: Chest wall: normal Lungs: Clear  CVS: Reveals the PMI to be normally located. Regular rhythm, First and Second Heart sounds are normal,  absence of murmurs, rubs or gallops. Peripheral  vasculature: Radial pulses: normal Dorsal pedis pulses: normal Posterior pulses: normal  ABDOMEN:  Appearance: normal Benign, no organomegaly, no masses, no Abdominal Aortic enlargement. No Guarding , no rebound. No Bruits. Bowel sounds: normal  RECTAL: N/A GU: N/A  EXTREMETIES: nonedematous.  MUSCULOSKELETAL:  Spine: cervical spine more  Reduced ROM more significant. Joints: intact  NEUROLOGIC: oriented to time,place and person; nonfocal. Strength is normal Sensory is normal Reflexes are normal  Cranial Nerves are normal.  ASSESSMENT: Headache(784.0) - secondary to cervical radiculitis. resolved  Degenerative arthritis of cervical spine  Cervical radiculitis  PLAN: Advised to refill the meloxicam and use prn. Consider Physical therapy.Patient will let us know if he will need PT May need refill of meloxicam. And referral to PT. No orders of the defined types were placed in this encounter.   No orders of the defined types were placed in this encounter.   There are no discontinued medications. Return if symptoms worsen or fail to improve.  Wilmina Maxham P. [place] Charon, M.D.

## 2013-01-17 ENCOUNTER — Other Ambulatory Visit: Payer: Self-pay | Admitting: Family Medicine

## 2013-01-24 ENCOUNTER — Ambulatory Visit (INDEPENDENT_AMBULATORY_CARE_PROVIDER_SITE_OTHER): Payer: Medicare Other | Admitting: Family Medicine

## 2013-01-24 ENCOUNTER — Encounter: Payer: Self-pay | Admitting: Family Medicine

## 2013-01-24 VITALS — BP 156/98 | HR 68 | Temp 98.5°F | Ht 69.0 in | Wt 197.4 lb

## 2013-01-24 DIAGNOSIS — N3281 Overactive bladder: Secondary | ICD-10-CM

## 2013-01-24 DIAGNOSIS — M5412 Radiculopathy, cervical region: Secondary | ICD-10-CM

## 2013-01-24 DIAGNOSIS — N400 Enlarged prostate without lower urinary tract symptoms: Secondary | ICD-10-CM

## 2013-01-24 DIAGNOSIS — M47812 Spondylosis without myelopathy or radiculopathy, cervical region: Secondary | ICD-10-CM

## 2013-01-24 DIAGNOSIS — Z0000 Encounter for general adult medical examination without abnormal findings: Secondary | ICD-10-CM | POA: Insufficient documentation

## 2013-01-24 DIAGNOSIS — E039 Hypothyroidism, unspecified: Secondary | ICD-10-CM

## 2013-01-24 DIAGNOSIS — F32A Depression, unspecified: Secondary | ICD-10-CM

## 2013-01-24 DIAGNOSIS — E785 Hyperlipidemia, unspecified: Secondary | ICD-10-CM

## 2013-01-24 DIAGNOSIS — J301 Allergic rhinitis due to pollen: Secondary | ICD-10-CM

## 2013-01-24 DIAGNOSIS — N318 Other neuromuscular dysfunction of bladder: Secondary | ICD-10-CM

## 2013-01-24 DIAGNOSIS — F329 Major depressive disorder, single episode, unspecified: Secondary | ICD-10-CM

## 2013-01-24 DIAGNOSIS — M1990 Unspecified osteoarthritis, unspecified site: Secondary | ICD-10-CM | POA: Insufficient documentation

## 2013-01-24 DIAGNOSIS — F3289 Other specified depressive episodes: Secondary | ICD-10-CM

## 2013-01-24 DIAGNOSIS — Z23 Encounter for immunization: Secondary | ICD-10-CM | POA: Insufficient documentation

## 2013-01-24 DIAGNOSIS — E559 Vitamin D deficiency, unspecified: Secondary | ICD-10-CM

## 2013-01-24 DIAGNOSIS — R51 Headache: Secondary | ICD-10-CM

## 2013-01-24 DIAGNOSIS — J45909 Unspecified asthma, uncomplicated: Secondary | ICD-10-CM

## 2013-01-24 MED ORDER — PNEUMOCOCCAL 13-VAL CONJ VACC IM SUSP: 0.5000 mL | Freq: Once | INTRAMUSCULAR | Status: DC

## 2013-01-24 NOTE — Patient Instructions (Addendum)
Pneumococcal Vaccine, Polyvalent suspension for injection What is this medicine? PNEUMOCOCCAL VACCINE, POLYVALENT (NEU mo KOK al vak SEEN, pol ee VEY luhnt) is a vaccine to prevent pneumococcus bacteria infection. These bacteria are a major cause of ear infections, 'Strep throat' infections, and serious pneumonia, meningitis, or blood infections worldwide. These vaccines help the body to produce antibodies (protective substances) that help your body defend against these bacteria. This vaccine is recommended for infants and young children. This vaccine will not treat an infection. This medicine may be used for other purposes; ask your health care provider or pharmacist if you have questions. COMMON BRAND NAME(S): Prevnar 13 , Prevnar What should I tell my health care provider before I take this medicine? They need to know if you have any of these conditions: -bleeding problems -fever -immune system problems -low platelet count in the blood -seizures -an unusual or allergic reaction to pneumococcal vaccine, diphtheria toxoid, other vaccines, latex, other medicines, foods, dyes, or preservatives -pregnant or trying to get pregnant -breast-feeding How should I use this medicine? This vaccine is for injection into a muscle. It is given by a health care professional. A copy of Vaccine Information Statements will be given before each vaccination. Read this sheet carefully each time. The sheet may change frequently. Talk to your pediatrician regarding the use of this medicine in children. While this drug may be prescribed for children as young as 23 weeks old for selected conditions, precautions do apply. Overdosage: If you think you have taken too much of this medicine contact a poison control center or emergency room at once. NOTE: This medicine is only for you. Do not share this medicine with others. What if I miss a dose? It is important not to miss your dose. Call your doctor or health care  professional if you are unable to keep an appointment. What may interact with this medicine? -medicines for cancer chemotherapy -medicines that suppress your immune function -medicines that treat or prevent blood clots like warfarin, enoxaparin, and dalteparin -steroid medicines like prednisone or cortisone This list may not describe all possible interactions. Give your health care provider a list of all the medicines, herbs, non-prescription drugs, or dietary supplements you use. Also tell them if you smoke, drink alcohol, or use illegal drugs. Some items may interact with your medicine. What should I watch for while using this medicine? Mild fever and pain should go away in 3 days or less. Report any unusual symptoms to your doctor or health care professional. What side effects may I notice from receiving this medicine? Side effects that you should report to your doctor or health care professional as soon as possible: -allergic reactions like skin rash, itching or hives, swelling of the face, lips, or tongue -breathing problems -confused -fever over 102 degrees F -pain, tingling, numbness in the hands or feet -seizures -unusual bleeding or bruising -unusual muscle weakness Side effects that usually do not require medical attention (report to your doctor or health care professional if they continue or are bothersome): -aches and pains -diarrhea -fever of 102 degrees F or less -headache -irritable -loss of appetite -pain, tender at site where injected -trouble sleeping This list may not describe all possible side effects. Call your doctor for medical advice about side effects. You may report side effects to FDA at 1-800-FDA-1088. Where should I keep my medicine? This does not apply. This vaccine is given in a clinic, pharmacy, doctor's office, or other health care setting and will not be stored at  home. NOTE: This sheet is a summary. It may not cover all possible information. If you  have questions about this medicine, talk to your doctor, pharmacist, or health care provider.  2014, Elsevier/Gold Standard. (2008-04-24 10:17:22)          HEALTH MAINTENANCE Immunizations: Tetanus-Diphtheria Booster received: 10/21/2010 Pertusis Booster due: Up to date Flu Shot Due: every Fall: Up to date Pneumonia Vaccine: usually at 71 years of age unless there are certain risk situations. Up to date Herpes Zoster/Shingles Vaccine due: usually at 71 years of age: up to date HPV WGN:FAOZ age 47 to 45 years in males and females. Not applicable  Healthy Life Habits: Exercise Goal: 5-6 days/week; start gradually(ie 30 minutes/3days per week) Nutrition: Balanced healthy meals including Vegetables and Fruits. Consider  Reading the following books: 1) Eat to Live by Dr Ottis Stain; 2) Prevent and Reverse Heart Disease by Dr Suzzette Righter.  Vitamins:okay Aspirin: 81 mg daily. Stop Tobacco Use:not applicable Seat Belt Use:+++ recommended Sunscreen Use:+++ recommended  Monthly Self Testicular Exam:++++  Eye Exam: every 1 to 2 years recommended Dental Health: at least every 6 months  Others:    Living Will/Healthcare Power of Attorney: should have this in order with your personal estate planning    Record BP daily and  Drop off in 4 weeks.

## 2013-01-24 NOTE — Progress Notes (Signed)
Tolerated prevnar injection with out difficulty 

## 2013-01-24 NOTE — Progress Notes (Signed)
Patient ID: [name], male   DOB: 1940-12-31, 72 y.o.   MRN: [id number] SUBJECTIVE: CC: Chief Complaint  Patient presents with  . Annual Exam    HERE FOR CPX     HPI: Here for annual PEX. Doing fairly well. His neck problem is  Stable at present.  Past Medical History  Diagnosis Date  . Irritable bowel syndrome   . Allergic rhinitis, cause unspecified   . Unspecified asthma(493.90)   . Hyperlipidemia   . Hypothyroidism   . Kidney stones   . Obesity   . Arthritis     degenerative arthritis of the neck   Past Surgical History  Procedure Laterality Date  . Tonsillectomy    . Vasectomy    . Rotator cuff repair      Left Shoulder  . Cataract extraction bilateral w/ anterior vitrectomy    . Transurethral resection of prostate     History   Social History  . Marital Status: Widowed    Spouse Name: N/A    Number of Children: N/A  . Years of Education: N/A   Occupational History  . retired    Social History Main Topics  . Smoking status: Former Smoker    Types: Cigarettes    Quit date: 02/24/1972  . Smokeless tobacco: Never Used  . Alcohol Use: No  . Drug Use: No  . Sexual Activity: Not on file   Other Topics Concern  . Not on file   Social History Narrative  . No narrative on file   Family History  Problem Relation Age of Onset  . Breast cancer Mother   . Diabetes Father   . Heart disease Father    Current Outpatient Prescriptions on File Prior to Visit  Medication Sig Dispense Refill  . albuterol (PROAIR HFA) 108 (90 BASE) MCG/ACT inhaler 2 puffs every 4 hours if needed- rescue inhaler  8.5 each  prn  . ALPRAZolam (XANAX) 0.5 MG tablet Take 1 tablet (0.5 mg total) by mouth 3 (three) times daily as needed for sleep or anxiety.  60 tablet  5  . aspirin 81 MG tablet Take 81 mg by mouth daily.      . Cholecalciferol (VITAMIN D) 2000 UNITS CAPS Take 1 capsule by mouth daily.        . Coenzyme Q10 (CO Q 10) 100 MG CAPS Take 1 capsule by mouth daily.       Marland Kitchen EPINEPHrine (EPI-PEN) 0.3 mg/0.3 mL SOAJ Inject 0.3 mLs (0.3 mg total) into the muscle once.  1 Device  prn  . FLUoxetine (PROZAC) 40 MG capsule Take 1 capsule (40 mg total) by mouth daily.  90 capsule  3  . Fluticasone-Salmeterol (ADVAIR) 250-50 MCG/DOSE AEPB Inhale 1 puff into the lungs every 12 (twelve) hours.  60 each  prn  . levothyroxine (SYNTHROID, LEVOTHROID) 88 MCG tablet TAKE 1 TABLET EVERY DAY  30 tablet  6  . meloxicam (MOBIC) 15 MG tablet Take 1 tablet (15 mg total) by mouth daily.  30 tablet  1  . Multiple Vitamin (MULTIVITAMIN) tablet Take 1 tablet by mouth daily.        . simvastatin (ZOCOR) 20 MG tablet TAKE 1 TABLET AT BEDTIME  30 tablet  0   No current facility-administered medications on file prior to visit.   No Known Allergies Immunization History  Administered Date(s) Administered  . Influenza Split 10/25/2011  . Influenza Whole 10/25/2010  . Influenza,inj,Quad PF,36+ Mos 11/15/2012  . Pneumococcal Conjugate-13 01/24/2013  .  Pneumococcal Polysaccharide-23 09/17/2011  . Tdap 10/21/2010  . Zoster 03/15/2006   Prior to Admission medications   Medication Sig Start Date End Date Taking? Authorizing Provider  albuterol (PROAIR HFA) 108 (90 BASE) MCG/ACT inhaler 2 puffs every 4 hours if needed- rescue inhaler 09/16/12  Yes Waymon Budge, MD  ALPRAZolam Prudy Feeler) 0.5 MG tablet Take 1 tablet (0.5 mg total) by mouth 3 (three) times daily as needed for sleep or anxiety. 09/16/12  Yes Waymon Budge, MD  aspirin 81 MG tablet Take 81 mg by mouth daily.   Yes Historical Provider, MD  Cholecalciferol (VITAMIN D) 2000 UNITS CAPS Take 1 capsule by mouth daily.     Yes Historical Provider, MD  Coenzyme Q10 (CO Q 10) 100 MG CAPS Take 1 capsule by mouth daily.   Yes Historical Provider, MD  EPINEPHrine (EPI-PEN) 0.3 mg/0.3 mL SOAJ Inject 0.3 mLs (0.3 mg total) into the muscle once. 09/16/12  Yes Waymon Budge, MD  FLUoxetine (PROZAC) 40 MG capsule Take 1 capsule (40 mg total) by  mouth daily. 05/20/12  Yes Ileana Ladd, MD  Fluticasone-Salmeterol (ADVAIR) 250-50 MCG/DOSE AEPB Inhale 1 puff into the lungs every 12 (twelve) hours. 09/16/12 09/17/14 Yes Waymon Budge, MD  levothyroxine (SYNTHROID, LEVOTHROID) 88 MCG tablet TAKE 1 TABLET EVERY DAY 12/20/12  Yes Ernestina Penna, MD  meloxicam (MOBIC) 15 MG tablet Take 1 tablet (15 mg total) by mouth daily. 10/18/12  Yes Ileana Ladd, MD  Multiple Vitamin (MULTIVITAMIN) tablet Take 1 tablet by mouth daily.     Yes Historical Provider, MD  simvastatin (ZOCOR) 20 MG tablet TAKE 1 TABLET AT BEDTIME 01/17/13  Yes Ileana Ladd, MD     ROS: As above in the HPI. All other systems are stable or negative.  OBJECTIVE: APPEARANCE:  Patient in no acute distress.The patient appeared well nourished and normally developed. Acyanotic. Waist: VITAL SIGNS:BP 156/98  Pulse 68  Temp(Src) 98.5 F (36.9 C) (Oral)  Ht 5\' 9"  (1.753 m)  Wt 197 lb 6.4 oz (89.54 kg)  BMI 29.14 kg/m2  WM overweight  SKIN: warm and  Dry without overt rashes, tattoos and scars  HEAD and Neck: without JVD, Head and scalp: normal Eyes:No scleral icterus. Fundi normal, eye movements normal. Ears: Auricle normal, canal normal, Tympanic membranes normal, insufflation normal. Nose: normal Throat: normal thyroid: normal Neck: reduced ROM  CHEST & LUNGS: Chest wall: normal Lungs: Clear  CVS: Reveals the PMI to be normally located. Regular rhythm, First and Second Heart sounds are normal,  absence of murmurs, rubs or gallops. Peripheral vasculature: Radial pulses: normal Dorsal pedis pulses: normal Posterior pulses: normal  ABDOMEN:  Appearance: normal Benign, no organomegaly, no masses, no Abdominal Aortic enlargement. No Guarding , no rebound. No Bruits. Bowel sounds: normal  RECTAL: moderately enlarged prostate. Smooth , no nodules. Heme negative GU: normal. No testicular masses.  EXTREMETIES: nonedematous.  MUSCULOSKELETAL:  Spine:  cervical, reduced ROM Joints: intact  NEUROLOGIC: oriented to time,place and person; nonfocal. Strength is normal Sensory is normal Reflexes are normal Cranial Nerves are normal.  ASSESSMENT:  Annual physical exam  HLD (hyperlipidemia) - Plan: CMP14+EGFR, NMR, lipoprofile  Unspecified hypothyroidism - Plan: TSH  Cervical radiculitis  Need for pneumococcal vaccination - Plan: pneumococcal 13-valent conjugate vaccine (PREVNAR 13) injection 0.5 mL  Allergic rhinitis due to pollen  BPH (benign prostatic hyperplasia)  Degenerative arthritis of cervical spine  Depression  Headache(784.0)  OAB (overactive bladder)  Unspecified asthma(493.90)  Unspecified vitamin D  deficiency - Plan: Vit D  25 hydroxy (rtn osteoporosis monitoring)  PLAN:        HEALTH MAINTENANCE Immunizations: Tetanus-Diphtheria Booster received: 10/21/2010 Pertusis Booster due: Up to date Flu Shot Due: every Fall: Up to date Pneumonia Vaccine: usually at 71 years of age unless there are certain risk situations. Up to date Herpes Zoster/Shingles Vaccine due: usually at 71 years of age: up to date HPV ZOX:WRUE age 6 to 10 years in males and females. Not applicable  Healthy Life Habits: Exercise Goal: 5-6 days/week; start gradually(ie 30 minutes/3days per week) Nutrition: Balanced healthy meals including Vegetables and Fruits. Consider  Reading the following books: 1) Eat to Live by Dr Ottis Stain; 2) Prevent and Reverse Heart Disease by Dr Suzzette Righter.  Vitamins:okay Aspirin: 81 mg daily. Stop Tobacco Use:not applicable Seat Belt Use:+++ recommended Sunscreen Use:+++ recommended  Monthly Self Testicular Exam:++++  Eye Exam: every 1 to 2 years recommended Dental Health: at least every 6 months  Others:    Living Will/Healthcare Power of Attorney: should have this in order with your personal estate planning    Record BP daily and  Drop off in 4 weeks.    Orders Placed This  Encounter  Procedures  . CMP14+EGFR  . NMR, lipoprofile  . TSH  . Vit D  25 hydroxy (rtn osteoporosis monitoring)   Meds ordered this encounter  Medications  . pneumococcal 13-valent conjugate vaccine (PREVNAR 13) injection 0.5 mL    Sig:    Medications Discontinued During This Encounter  Medication Reason  . amoxicillin-clavulanate (AUGMENTIN XR) 1000-62.5 MG per tablet Completed Course  . levothyroxine (SYNTHROID, LEVOTHROID) 75 MCG tablet Change in therapy   Return in about 3 months (around 04/24/2013) for Recheck medical problems.  Jaskaran Dauzat P. [place] Charon, M.D.

## 2013-01-26 LAB — NMR, LIPOPROFILE
Cholesterol: 117 mg/dL (ref <200)
HDL Cholesterol by NMR: 39 mg/dL — ABNORMAL LOW (ref >=40)
HDL Particle Number: 33.5 umol/L (ref >=30.5)
LDL Particle Number: 1082 nmol/L — ABNORMAL HIGH (ref <1000)
LDL Size: 19.8 nm — ABNORMAL LOW (ref >20.5)
LDLC SERPL CALC-MCNC: 58 mg/dL (ref <100)
LP-IR Score: 65 — ABNORMAL HIGH (ref <=45)
Small LDL Particle Number: 878 nmol/L — ABNORMAL HIGH (ref <=527)
Triglycerides by NMR: 102 mg/dL (ref <150)

## 2013-01-26 LAB — CMP14+EGFR
ALT: 24 IU/L (ref 0–44)
AST: 26 IU/L (ref 0–40)
Albumin/Globulin Ratio: 2.2 (ref 1.1–2.5)
Albumin: 4.8 g/dL (ref 3.5–4.8)
Alkaline Phosphatase: 82 IU/L (ref 39–117)
BUN/Creatinine Ratio: 18 (ref 10–22)
BUN: 12 mg/dL (ref 8–27)
CO2: 24 mmol/L (ref 18–29)
Calcium: 9.5 mg/dL (ref 8.6–10.2)
Chloride: 101 mmol/L (ref 97–108)
Creatinine, Ser: 0.68 mg/dL — ABNORMAL LOW (ref 0.76–1.27)
GFR calc Af Amer: 111 mL/min/{1.73_m2} (ref >59)
GFR calc non Af Amer: 96 mL/min/{1.73_m2} (ref >59)
Globulin, Total: 2.2 g/dL (ref 1.5–4.5)
Glucose: 99 mg/dL (ref 65–99)
Potassium: 4.1 mmol/L (ref 3.5–5.2)
Sodium: 140 mmol/L (ref 134–144)
Total Bilirubin: 0.6 mg/dL (ref 0.0–1.2)
Total Protein: 7 g/dL (ref 6.0–8.5)

## 2013-01-26 LAB — VITAMIN D 25 HYDROXY (VIT D DEFICIENCY, FRACTURES): Vit D, 25-Hydroxy: 43.4 ng/mL (ref 30.0–100.0)

## 2013-01-26 LAB — TSH: TSH: 1.22 u[IU]/mL (ref 0.450–4.500)

## 2013-02-13 ENCOUNTER — Telehealth: Payer: Self-pay | Admitting: *Deleted

## 2013-02-13 NOTE — Telephone Encounter (Signed)
Message copied by Baltazar Apo on Mon Feb 13, 2013  2:51 PM ------      Message from: Ileana Ladd      Created: Thu Jan 26, 2013  9:02 AM       Call Patient      Lab result at goal.      No change in Medications for now.      No Change in recommendations and plans for follow up. ------

## 2013-02-13 NOTE — Telephone Encounter (Signed)
Pt.notified

## 2013-02-13 NOTE — Telephone Encounter (Signed)
Labs at goal/ lm to call back.

## 2013-02-14 ENCOUNTER — Other Ambulatory Visit: Payer: Self-pay | Admitting: Family Medicine

## 2013-02-22 ENCOUNTER — Ambulatory Visit (INDEPENDENT_AMBULATORY_CARE_PROVIDER_SITE_OTHER): Payer: Medicare Other

## 2013-02-22 DIAGNOSIS — J309 Allergic rhinitis, unspecified: Secondary | ICD-10-CM

## 2013-04-27 ENCOUNTER — Ambulatory Visit: Payer: Medicare Other | Admitting: Family Medicine

## 2013-05-18 ENCOUNTER — Ambulatory Visit (INDEPENDENT_AMBULATORY_CARE_PROVIDER_SITE_OTHER): Payer: Medicare Other | Admitting: Family Medicine

## 2013-05-18 ENCOUNTER — Encounter: Payer: Self-pay | Admitting: Family Medicine

## 2013-05-18 VITALS — BP 126/78 | HR 69 | Temp 98.6°F | Ht 69.0 in | Wt 196.2 lb

## 2013-05-18 DIAGNOSIS — R5383 Other fatigue: Secondary | ICD-10-CM

## 2013-05-18 DIAGNOSIS — R5381 Other malaise: Secondary | ICD-10-CM

## 2013-05-18 DIAGNOSIS — M5412 Radiculopathy, cervical region: Secondary | ICD-10-CM

## 2013-05-18 DIAGNOSIS — N318 Other neuromuscular dysfunction of bladder: Secondary | ICD-10-CM

## 2013-05-18 DIAGNOSIS — F3289 Other specified depressive episodes: Secondary | ICD-10-CM

## 2013-05-18 DIAGNOSIS — E559 Vitamin D deficiency, unspecified: Secondary | ICD-10-CM

## 2013-05-18 DIAGNOSIS — F329 Major depressive disorder, single episode, unspecified: Secondary | ICD-10-CM

## 2013-05-18 DIAGNOSIS — F32A Depression, unspecified: Secondary | ICD-10-CM

## 2013-05-18 DIAGNOSIS — J45909 Unspecified asthma, uncomplicated: Secondary | ICD-10-CM

## 2013-05-18 DIAGNOSIS — N3281 Overactive bladder: Secondary | ICD-10-CM

## 2013-05-18 DIAGNOSIS — M47812 Spondylosis without myelopathy or radiculopathy, cervical region: Secondary | ICD-10-CM

## 2013-05-18 DIAGNOSIS — E039 Hypothyroidism, unspecified: Secondary | ICD-10-CM

## 2013-05-18 DIAGNOSIS — J301 Allergic rhinitis due to pollen: Secondary | ICD-10-CM

## 2013-05-18 DIAGNOSIS — E785 Hyperlipidemia, unspecified: Secondary | ICD-10-CM

## 2013-05-18 NOTE — Progress Notes (Signed)
Patient ID: [name], male   DOB: 1940/03/10, 73 y.o.   MRN: [id number] SUBJECTIVE: CC: Chief Complaint  Patient presents with  . Follow-up    3 month follow up    HPI: Patient is here for follow up of hyperlipidemia: denies Headache;denies Chest Pain;denies weakness;denies Shortness of Breath and orthopnea;denies Visual changes;denies palpitations;denies cough;denies pedal edema;denies symptoms of TIA or stroke;deniesClaudication symptoms. admits to Compliance with medications; denies Problems with medications.  Daytime fatigue. Has to take nap. Denies snoring  Past Medical History  Diagnosis Date  . Irritable bowel syndrome   . Allergic rhinitis, cause unspecified   . Unspecified asthma(493.90)   . Hyperlipidemia   . Hypothyroidism   . Kidney stones   . Obesity   . Arthritis     degenerative arthritis of the neck   Past Surgical History  Procedure Laterality Date  . Tonsillectomy    . Vasectomy    . Rotator cuff repair      Left Shoulder  . Cataract extraction bilateral w/ anterior vitrectomy    . Transurethral resection of prostate     History   Social History  . Marital Status: Widowed    Spouse Name: N/A    Number of Children: N/A  . Years of Education: N/A   Occupational History  . retired    Social History Main Topics  . Smoking status: Former Smoker    Types: Cigarettes    Quit date: 02/24/1972  . Smokeless tobacco: Never Used  . Alcohol Use: No  . Drug Use: No  . Sexual Activity: Not on file   Other Topics Concern  . Not on file   Social History Narrative  . No narrative on file   Family History  Problem Relation Age of Onset  . Breast cancer Mother   . Diabetes Father   . Heart disease Father    Current Outpatient Prescriptions on File Prior to Visit  Medication Sig Dispense Refill  . albuterol (PROAIR HFA) 108 (90 BASE) MCG/ACT inhaler 2 puffs every 4 hours if needed- rescue inhaler  8.5 each  prn  . ALPRAZolam (XANAX) 0.5 MG  tablet Take 1 tablet (0.5 mg total) by mouth 3 (three) times daily as needed for sleep or anxiety.  60 tablet  5  . aspirin 81 MG tablet Take 81 mg by mouth daily.      . Cholecalciferol (VITAMIN D) 2000 UNITS CAPS Take 1 capsule by mouth daily.        . Coenzyme Q10 (CO Q 10) 100 MG CAPS Take 1 capsule by mouth daily.      Marland Kitchen EPINEPHrine (EPI-PEN) 0.3 mg/0.3 mL SOAJ Inject 0.3 mLs (0.3 mg total) into the muscle once.  1 Device  prn  . FLUoxetine (PROZAC) 40 MG capsule Take 1 capsule (40 mg total) by mouth daily.  90 capsule  3  . Fluticasone-Salmeterol (ADVAIR) 250-50 MCG/DOSE AEPB Inhale 1 puff into the lungs every 12 (twelve) hours.  60 each  prn  . levothyroxine (SYNTHROID, LEVOTHROID) 88 MCG tablet TAKE 1 TABLET EVERY DAY  30 tablet  6  . Multiple Vitamin (MULTIVITAMIN) tablet Take 1 tablet by mouth daily.        . simvastatin (ZOCOR) 20 MG tablet TAKE 1 TABLET AT BEDTIME  30 tablet  4   Current Facility-Administered Medications on File Prior to Visit  Medication Dose Route Frequency Provider Last Rate Last Dose  . pneumococcal 13-valent conjugate vaccine (PREVNAR 13) injection 0.5 mL  0.5  mL Intramuscular Once Vernie Shanks, MD       No Known Allergies Immunization History  Administered Date(s) Administered  . Influenza Split 10/25/2011  . Influenza Whole 10/25/2010  . Influenza,inj,Quad PF,36+ Mos 11/15/2012  . Pneumococcal Conjugate-13 01/24/2013  . Pneumococcal Polysaccharide-23 09/17/2011  . Tdap 10/21/2010  . Zoster 03/15/2006   Prior to Admission medications   Medication Sig Start Date End Date Taking? Authorizing Provider  albuterol (PROAIR HFA) 108 (90 BASE) MCG/ACT inhaler 2 puffs every 4 hours if needed- rescue inhaler 09/16/12   Deneise Lever, MD  ALPRAZolam Duanne Moron) 0.5 MG tablet Take 1 tablet (0.5 mg total) by mouth 3 (three) times daily as needed for sleep or anxiety. 09/16/12   Deneise Lever, MD  aspirin 81 MG tablet Take 81 mg by mouth daily.    Historical  Provider, MD  Cholecalciferol (VITAMIN D) 2000 UNITS CAPS Take 1 capsule by mouth daily.      Historical Provider, MD  Coenzyme Q10 (CO Q 10) 100 MG CAPS Take 1 capsule by mouth daily.    Historical Provider, MD  EPINEPHrine (EPI-PEN) 0.3 mg/0.3 mL SOAJ Inject 0.3 mLs (0.3 mg total) into the muscle once. 09/16/12   Deneise Lever, MD  FLUoxetine (PROZAC) 40 MG capsule Take 1 capsule (40 mg total) by mouth daily. 05/20/12   Vernie Shanks, MD  Fluticasone-Salmeterol (ADVAIR) 250-50 MCG/DOSE AEPB Inhale 1 puff into the lungs every 12 (twelve) hours. 09/16/12 09/17/14  Deneise Lever, MD  levothyroxine (SYNTHROID, LEVOTHROID) 88 MCG tablet TAKE 1 TABLET EVERY DAY 12/20/12   Chipper Herb, MD  Multiple Vitamin (MULTIVITAMIN) tablet Take 1 tablet by mouth daily.      Historical Provider, MD  simvastatin (ZOCOR) 20 MG tablet TAKE 1 TABLET AT BEDTIME 02/14/13   Vernie Shanks, MD     ROS: As above in the HPI. All other systems are stable or negative.  OBJECTIVE: APPEARANCE:  Patient in no acute distress.The patient appeared well nourished and normally developed. Acyanotic. Waist: VITAL SIGNS:BP 126/78  Pulse 69  Temp(Src) 98.6 F (37 C) (Oral)  Ht 5' 9" (1.753 m)  Wt 196 lb 3.2 oz (88.996 kg)  BMI 28.96 kg/m2 WM  SKIN: warm and  Dry without overt rashes, tattoos and scars  HEAD and Neck: without JVD, Head and scalp: normal Eyes:No scleral icterus. Fundi normal, eye movements normal. Ears: Auricle normal, canal normal, Tympanic membranes normal, insufflation normal. Nose: normal Throat: normal Neck & thyroid: normal  CHEST & LUNGS: Chest wall: normal Lungs: Clear  CVS: Reveals the PMI to be normally located. Regular rhythm, First and Second Heart sounds are normal,  absence of murmurs, rubs or gallops. Peripheral vasculature: Radial pulses: normal Dorsal pedis pulses: normal Posterior pulses: normal  ABDOMEN:  Appearance: normal Benign, no organomegaly, no masses, no  Abdominal Aortic enlargement. No Guarding , no rebound. No Bruits. Bowel sounds: normal  RECTAL: N/A GU: N/A  EXTREMETIES: nonedematous.  MUSCULOSKELETAL:  Spine: normal Joints: intact  NEUROLOGIC: oriented to time,place and person; nonfocal. Strength is normal Sensory is normal Reflexes are normal Cranial Nerves are normal. Results for orders placed in visit on 01/24/13  CMP14+EGFR      Result Value Ref Range   Glucose 99  65 - 99 mg/dL   BUN 12  8 - 27 mg/dL   Creatinine, Ser 0.68 (*) 0.76 - 1.27 mg/dL   GFR calc non Af Amer 96  >59 mL/min/1.73   GFR calc Af Wyvonnia Lora  111  >59 mL/min/1.73   BUN/Creatinine Ratio 18  10 - 22   Sodium 140  134 - 144 mmol/L   Potassium 4.1  3.5 - 5.2 mmol/L   Chloride 101  97 - 108 mmol/L   CO2 24  18 - 29 mmol/L   Calcium 9.5  8.6 - 10.2 mg/dL   Total Protein 7.0  6.0 - 8.5 g/dL   Albumin 4.8  3.5 - 4.8 g/dL   Globulin, Total 2.2  1.5 - 4.5 g/dL   Albumin/Globulin Ratio 2.2  1.1 - 2.5   Total Bilirubin 0.6  0.0 - 1.2 mg/dL   Alkaline Phosphatase 82  39 - 117 IU/L   AST 26  0 - 40 IU/L   ALT 24  0 - 44 IU/L  NMR, LIPOPROFILE      Result Value Ref Range   LDL Particle Number 1082 (*) <1000 nmol/L   LDLC SERPL CALC-MCNC 58  <100 mg/dL   HDL Cholesterol by NMR 39 (*) >=40 mg/dL   Triglycerides by NMR 102  <150 mg/dL   Cholesterol 117  <200 mg/dL   HDL Particle Number 33.5  >=30.5 umol/L   Small LDL Particle Number 878 (*) <=527 nmol/L   LDL Size 19.8 (*) >20.5 nm   LP-IR Score 65 (*) <=45  TSH      Result Value Ref Range   TSH 1.220  0.450 - 4.500 uIU/mL  VITAMIN D 25 HYDROXY      Result Value Ref Range   Vit D, 25-Hydroxy 43.4  30.0 - 100.0 ng/mL    ASSESSMENT: Unspecified hypothyroidism - Plan: TSH  Allergic rhinitis due to pollen  HLD (hyperlipidemia) - Plan: CMP14+EGFR, Lipid panel  Unspecified vitamin D deficiency  Unspecified asthma(493.90)  OAB (overactive bladder)  Depression  Degenerative arthritis of cervical  spine  Cervical radiculitis  Other malaise and fatigue - Plan: POCT CBC, CMP14+EGFR, TSH, Vit D  25 hydroxy (rtn osteoporosis monitoring)  PLAN: Double to 235m daily of CoQ10. Consider sleep study. Continue with 11:30 am nap. Labs next week.   Orders Placed This Encounter  Procedures  . CMP14+EGFR    Standing Status: Future     Number of Occurrences:      Standing Expiration Date: 05/19/2014    Order Specific Question:  Has the patient fasted?    Answer:  Yes  . Lipid panel    Standing Status: Future     Number of Occurrences:      Standing Expiration Date: 05/19/2014    Order Specific Question:  Has the patient fasted?    Answer:  Yes  . TSH    Standing Status: Future     Number of Occurrences:      Standing Expiration Date: 05/19/2014  . Vit D  25 hydroxy (rtn osteoporosis monitoring)    Standing Status: Future     Number of Occurrences:      Standing Expiration Date: 05/19/2014  . POCT CBC    Standing Status: Future     Number of Occurrences:      Standing Expiration Date: 06/18/2013   No orders of the defined types were placed in this encounter.   Medications Discontinued During This Encounter  Medication Reason  . meloxicam (MOBIC) 15 MG tablet Completed Course   Return in about 3 months (around 08/18/2013) for Recheck medical problems.  Gwynevere Lizana P. WJacelyn Grip M.D.    \Marland Kitchen

## 2013-05-18 NOTE — Patient Instructions (Addendum)
Double to 200mg  daily of CoQ10. Consider sleep study. Continue with 11:30 am nap. Labs next week.

## 2013-05-22 ENCOUNTER — Other Ambulatory Visit: Payer: Self-pay | Admitting: Family Medicine

## 2013-05-23 ENCOUNTER — Other Ambulatory Visit (INDEPENDENT_AMBULATORY_CARE_PROVIDER_SITE_OTHER): Payer: Medicare Other

## 2013-05-23 DIAGNOSIS — E785 Hyperlipidemia, unspecified: Secondary | ICD-10-CM

## 2013-05-23 DIAGNOSIS — R5381 Other malaise: Secondary | ICD-10-CM

## 2013-05-23 DIAGNOSIS — E039 Hypothyroidism, unspecified: Secondary | ICD-10-CM

## 2013-05-23 DIAGNOSIS — R5383 Other fatigue: Principal | ICD-10-CM

## 2013-05-23 LAB — POCT CBC
Granulocyte percent: 55.5 %G (ref 37–80)
HCT, POC: 42.8 % — AB (ref 43.5–53.7)
Hemoglobin: 14.5 g/dL (ref 14.1–18.1)
Lymph, poc: 2.2 (ref 0.6–3.4)
MCH, POC: 30.5 pg (ref 27–31.2)
MCHC: 34 g/dL (ref 31.8–35.4)
MCV: 89.7 fL (ref 80–97)
MPV: 6.1 fL (ref 0–99.8)
POC Granulocyte: 3.3 (ref 2–6.9)
POC LYMPH PERCENT: 37.3 %L (ref 10–50)
Platelet Count, POC: 226 10*3/uL (ref 142–424)
RBC: 4.8 M/uL (ref 4.69–6.13)
RDW, POC: 12.4 %
WBC: 6 10*3/uL (ref 4.6–10.2)

## 2013-05-23 NOTE — Progress Notes (Signed)
Pt came in for labs only 

## 2013-05-24 LAB — CMP14+EGFR
ALT: 23 IU/L (ref 0–44)
AST: 26 IU/L (ref 0–40)
Albumin/Globulin Ratio: 2.1 (ref 1.1–2.5)
Albumin: 4.4 g/dL (ref 3.5–4.8)
Alkaline Phosphatase: 76 IU/L (ref 39–117)
BUN/Creatinine Ratio: 19 (ref 10–22)
BUN: 15 mg/dL (ref 8–27)
CO2: 24 mmol/L (ref 18–29)
Calcium: 9.2 mg/dL (ref 8.6–10.2)
Chloride: 100 mmol/L (ref 97–108)
Creatinine, Ser: 0.77 mg/dL (ref 0.76–1.27)
GFR calc Af Amer: 106 mL/min/{1.73_m2} (ref >59)
GFR calc non Af Amer: 91 mL/min/{1.73_m2} (ref >59)
Globulin, Total: 2.1 g/dL (ref 1.5–4.5)
Glucose: 112 mg/dL — ABNORMAL HIGH (ref 65–99)
Potassium: 4.1 mmol/L (ref 3.5–5.2)
Sodium: 140 mmol/L (ref 134–144)
Total Bilirubin: 0.4 mg/dL (ref 0.0–1.2)
Total Protein: 6.5 g/dL (ref 6.0–8.5)

## 2013-05-24 LAB — TSH: TSH: 3.88 u[IU]/mL (ref 0.450–4.500)

## 2013-05-24 LAB — LIPID PANEL
Chol/HDL Ratio: 3.5 ratio units (ref 0.0–5.0)
Cholesterol, Total: 114 mg/dL (ref 100–199)
HDL: 33 mg/dL — ABNORMAL LOW (ref >39)
LDL Calculated: 57 mg/dL (ref 0–99)
Triglycerides: 121 mg/dL (ref 0–149)
VLDL Cholesterol Cal: 24 mg/dL (ref 5–40)

## 2013-05-24 LAB — VITAMIN D 25 HYDROXY (VIT D DEFICIENCY, FRACTURES): Vit D, 25-Hydroxy: 38.1 ng/mL (ref 30.0–100.0)

## 2013-07-13 ENCOUNTER — Encounter: Payer: Self-pay | Admitting: Gastroenterology

## 2013-07-17 ENCOUNTER — Other Ambulatory Visit: Payer: Self-pay | Admitting: Family Medicine

## 2013-07-19 ENCOUNTER — Other Ambulatory Visit: Payer: Self-pay | Admitting: *Deleted

## 2013-07-19 MED ORDER — SIMVASTATIN 20 MG PO TABS: ORAL_TABLET | ORAL | Status: DC

## 2013-07-24 ENCOUNTER — Encounter: Payer: Self-pay | Admitting: Family Medicine

## 2013-07-24 ENCOUNTER — Ambulatory Visit (INDEPENDENT_AMBULATORY_CARE_PROVIDER_SITE_OTHER): Payer: Medicare Other | Admitting: Family Medicine

## 2013-07-24 VITALS — BP 118/75 | HR 79 | Temp 98.0°F | Ht 69.0 in | Wt 194.2 lb

## 2013-07-24 DIAGNOSIS — K645 Perianal venous thrombosis: Secondary | ICD-10-CM

## 2013-07-24 MED ORDER — HYDROCORTISONE ACETATE 25 MG RE SUPP: 25.0000 mg | Freq: Two times a day (BID) | RECTAL | Status: DC

## 2013-07-24 MED ORDER — HYDROCORTISONE 2.5 % RE CREA: 1.0000 "application " | TOPICAL_CREAM | Freq: Two times a day (BID) | RECTAL | Status: DC

## 2013-07-24 NOTE — Progress Notes (Signed)
   Subjective:    Patient ID: [name], male    DOB: November 02, 1940, 73 y.o.   MRN: [id number]  HPI  C/o bump on outside of rectum for 3 days now.   Review of Systems    No chest pain, SOB, HA, dizziness, vision change, N/V, diarrhea, constipation, dysuria, urinary urgency or frequency, myalgias, arthralgias or rash.  Objective:   Physical Exam Vital signs noted  Well developed well nourished male.  HEENT - Head atraumatic Normocephalic CARD - RRR s1 and s2 w/o murmur  Respiratory - Lungs CTA bilateral S2 without murmur GI - External thrombosed hemorrhoid left rectum.       Assessment & Plan:  Thrombosed external hemorrhoid - Plan: hydrocortisone (ANUSOL-HC) 25 MG suppository, hydrocortisone (ANUSOL-HC) 2.5 % rectal cream Sitz bath  Follow up prn  Lysbeth Penner FNP

## 2013-07-27 ENCOUNTER — Telehealth: Payer: Self-pay | Admitting: Family Medicine

## 2013-07-27 NOTE — Telephone Encounter (Signed)
Spoke with pt regarding RX for suppositories Insurance denied coverage for suppositories and the cost was $81 Informed pt that he only needs to use until resolved He may also try OTC Annusol suppositories Verbalizes understanding

## 2013-08-28 ENCOUNTER — Other Ambulatory Visit: Payer: Self-pay | Admitting: Nurse Practitioner

## 2013-08-29 ENCOUNTER — Ambulatory Visit (AMBULATORY_SURGERY_CENTER): Payer: Medicare Other

## 2013-08-29 ENCOUNTER — Encounter: Payer: Self-pay | Admitting: Gastroenterology

## 2013-08-29 VITALS — Ht 69.0 in | Wt 196.0 lb

## 2013-08-29 DIAGNOSIS — Z86010 Personal history of colonic polyps: Secondary | ICD-10-CM

## 2013-08-29 MED ORDER — NA SULFATE-K SULFATE-MG SULF 17.5-3.13-1.6 GM/177ML PO SOLN: ORAL | Status: DC

## 2013-08-29 NOTE — Progress Notes (Signed)
Per pt, no allergies to soy or egg products.Pt not taking any weight loss meds or using  O2 at home. 

## 2013-08-30 NOTE — Telephone Encounter (Signed)
Called in.

## 2013-08-30 NOTE — Telephone Encounter (Signed)
This is okay to refill x1. Please have patient make an appointment to be seen by a provider for future refills

## 2013-08-30 NOTE — Telephone Encounter (Signed)
Patient last seen in office on 07-24-13 for an acute visit. Former patient of Dr Jacelyn Grip. Rx last filled on 08-19-13 for #60. Please advise. If approved please route to Pool A so nurse can phone in to pharmacy

## 2013-09-12 ENCOUNTER — Encounter: Payer: Medicare Other | Admitting: Gastroenterology

## 2013-09-20 ENCOUNTER — Ambulatory Visit (AMBULATORY_SURGERY_CENTER): Payer: Medicare Other | Admitting: Gastroenterology

## 2013-09-20 ENCOUNTER — Encounter: Payer: Self-pay | Admitting: Gastroenterology

## 2013-09-20 VITALS — BP 126/95 | HR 63 | Temp 97.0°F | Resp 20 | Ht 69.0 in | Wt 196.0 lb

## 2013-09-20 DIAGNOSIS — K5730 Diverticulosis of large intestine without perforation or abscess without bleeding: Secondary | ICD-10-CM

## 2013-09-20 DIAGNOSIS — Z86010 Personal history of colonic polyps: Secondary | ICD-10-CM

## 2013-09-20 DIAGNOSIS — K648 Other hemorrhoids: Secondary | ICD-10-CM

## 2013-09-20 MED ORDER — SODIUM CHLORIDE 0.9 % IV SOLN: 500.0000 mL | INTRAVENOUS | Status: DC

## 2013-09-20 NOTE — Progress Notes (Signed)
  [facility] Post-op Note  Patient: [name]  Procedure(s) Performed: colonoscopy  Patient Location: [facility] - Recovery Area  Anesthesia Type: Deep Sedation/Propofol  Level of Consciousness: awake, oriented and patient cooperative  Airway and Oxygen Therapy: Patient Spontanous Breathing  Post-op Pain: none  Post-op Assessment:  Post-op Vital signs reviewed, Patient's Cardiovascular Status Stable, Respiratory Function Stable, Patent Airway, No signs of Nausea or vomiting and Pain level controlled  Post-op Vital Signs: Reviewed and stable  Complications: No apparent anesthesia complications  Andranik Jeune E 2:00 PM

## 2013-09-20 NOTE — Op Note (Signed)
[place]  [address]. [place], [id number]   COLONOSCOPY PROCEDURE REPORT  PATIENT: Kevin, Acevedo  MR#: [id number] BIRTHDATE: [birth year] , 72  yrs. old GENDER: Male ENDOSCOPIST: Inda Castle, MD REFERRED XN:[id number] Jacelyn Grip, M.D. PROCEDURE DATE:  09/20/2013 PROCEDURE:   Colonoscopy, diagnostic First Screening Colonoscopy - Avg.  risk and is 50 yrs.  old or older - No.  Prior Negative Screening - Now for repeat screening. N/A  History of Adenoma - Now for follow-up colonoscopy & has been > or = to 3 yrs.  Yes hx of adenoma.  Has been 3 or more years since last colonoscopy.  Polyps Removed Today? No.  Recommend repeat exam, <10 yrs? No. ASA CLASS:   Class II INDICATIONS:Patient's personal history of adenomatous colon polyps 2010 MEDICATIONS: MAC sedation, administered by CRNA and propofol (Diprivan) 200mg  IV  DESCRIPTION OF PROCEDURE:   After the risks benefits and alternatives of the procedure were thoroughly explained, informed consent was obtained.  A digital rectal exam revealed no abnormalities of the rectum.   The LB [id number]  endoscope was introduced through the anus and advanced to the cecum, which was identified by both the appendix and ileocecal valve. No adverse events experienced.   The quality of the prep was excellent using Suprep  The instrument was then slowly withdrawn as the colon was fully examined.      COLON FINDINGS: Mild diverticulosis was noted in the rectum. Internal hemorrhoids were found.   The colon was otherwise normal. There was no diverticulosis, inflammation, polyps or cancers unless previously stated.  Retroflexed views revealed no abnormalities. The time to cecum=5 minutes 39 seconds.  Withdrawal time=7 minutes 55 seconds.  The scope was withdrawn and the procedure completed. COMPLICATIONS: There were no complications.  ENDOSCOPIC IMPRESSION: 1.   Mild diverticulosis was noted in the rectum 2.   Internal  hemorrhoids 3.   The colon was otherwise normal  RECOMMENDATIONS: Given your age, you will not need another colonoscopy for colon cancer screening or polyp surveillance.  These types of tests usually stop around the age 72.   eSigned:  Inda Castle, MD 09/20/2013 2:01 PM   cc:   PATIENT NAME:  [name], [name] MR#: [id number]

## 2013-09-20 NOTE — Patient Instructions (Addendum)

## 2013-09-21 ENCOUNTER — Telehealth: Payer: Self-pay

## 2013-09-21 ENCOUNTER — Encounter: Payer: Self-pay | Admitting: Internal Medicine

## 2013-09-21 NOTE — Telephone Encounter (Signed)
Left a message at [phone] for the pt to call back if he has any questions or concerns. maw

## 2013-09-22 ENCOUNTER — Ambulatory Visit: Payer: Medicare Other | Admitting: Internal Medicine

## 2013-10-03 ENCOUNTER — Encounter: Payer: Self-pay | Admitting: *Deleted

## 2013-10-11 ENCOUNTER — Encounter: Payer: Self-pay | Admitting: Family Medicine

## 2013-10-11 ENCOUNTER — Ambulatory Visit (INDEPENDENT_AMBULATORY_CARE_PROVIDER_SITE_OTHER): Payer: Medicare Other | Admitting: Family Medicine

## 2013-10-11 VITALS — BP 130/85 | HR 66 | Temp 98.5°F | Ht 69.0 in | Wt 193.0 lb

## 2013-10-11 DIAGNOSIS — R7309 Other abnormal glucose: Secondary | ICD-10-CM

## 2013-10-11 DIAGNOSIS — F32A Depression, unspecified: Secondary | ICD-10-CM

## 2013-10-11 DIAGNOSIS — F329 Major depressive disorder, single episode, unspecified: Secondary | ICD-10-CM

## 2013-10-11 DIAGNOSIS — R7303 Prediabetes: Secondary | ICD-10-CM

## 2013-10-11 DIAGNOSIS — E785 Hyperlipidemia, unspecified: Secondary | ICD-10-CM

## 2013-10-11 DIAGNOSIS — Z0000 Encounter for general adult medical examination without abnormal findings: Secondary | ICD-10-CM

## 2013-10-11 LAB — POCT GLYCOSYLATED HEMOGLOBIN (HGB A1C): Hemoglobin A1C: 5.4

## 2013-10-11 MED ORDER — FLUOXETINE HCL 20 MG PO TABS: 20.0000 mg | ORAL_TABLET | Freq: Every day | ORAL | Status: DC

## 2013-10-11 MED ORDER — ALPRAZOLAM 0.5 MG PO TABS: 0.5000 mg | ORAL_TABLET | Freq: Every evening | ORAL | Status: DC | PRN

## 2013-10-11 NOTE — Assessment & Plan Note (Signed)
States he is not depressed although admits to loneliness.  Desires to taper prozac, down to 20 mg

## 2013-10-11 NOTE — Progress Notes (Signed)
   Subjective:    Patient ID: [name], male    DOB: 29-Aug-1940, 73 y.o.   MRN: [id number]  HPI 73 year old gentleman who is here for an annual physical. He relates problems of loneliness and depression since his wife died about 3 years ago. 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 We discussed at length issues related to cholesterol anxiety and depression, energy levels and musculoskeletal issues he brings in a diary of blood pressure checks all of which are normal in all of which occur about the same time every day in the morning. His blood pressure today is office visit is 130/80 again we talked about blood pressure guidelines and most recent recommendations    Review of Systems  Psychiatric/Behavioral: The patient is nervous/anxious.   All other systems reviewed and are negative.      Objective:   Physical Exam  Constitutional: He is oriented to person, place, and time. He appears well-developed and well-nourished.  HENT:  Head: Normocephalic.  Right Ear: External ear normal.  Left Ear: External ear normal.  Nose: Nose normal.  Mouth/Throat: Oropharynx is clear and moist.  Eyes: Conjunctivae and EOM are normal. Pupils are equal, round, and reactive to light.  Neck: Normal range of motion. Neck supple.  Cardiovascular: Normal rate, regular rhythm, normal heart sounds and intact distal pulses.   Pulmonary/Chest: Effort normal and breath sounds normal.  Abdominal: Soft. Bowel sounds are normal.  Musculoskeletal: Normal range of motion.  Neurological: He is alert and oriented to person, place, and time.  Skin: Skin is warm and dry.  Psychiatric: He has a normal mood and affect. His behavior is normal. Judgment and thought content normal.   BP 130/85  Pulse 66  Temp(Src) 98.5 F (36.9 C) (Oral)  Ht 5\' 9"  (1.753 m)  Wt 193 lb (87.544 kg)  BMI 28.49 kg/m2       Assessment & Plan:  1. Pre-diabetes This is  a new revelation.  Plan to check A1C.  Rec low carb diet and weight loss - POCT glycosylated hemoglobin (Hb  2. Depression Reduce Prozac to 20 mg  3. HLD (hyperlipidemia) Labs have been good(LDL)  Will try to reduce statin to 3 days/week  Wardell Honour MD

## 2013-11-14 ENCOUNTER — Other Ambulatory Visit: Payer: Self-pay | Admitting: Family

## 2013-11-28 ENCOUNTER — Ambulatory Visit (INDEPENDENT_AMBULATORY_CARE_PROVIDER_SITE_OTHER): Payer: Medicare Other

## 2013-11-28 DIAGNOSIS — Z23 Encounter for immunization: Secondary | ICD-10-CM

## 2014-02-02 ENCOUNTER — Other Ambulatory Visit: Payer: Self-pay | Admitting: Nurse Practitioner

## 2014-02-02 NOTE — Telephone Encounter (Signed)
Refill per protocol; next visit to be 03/2014

## 2014-02-06 ENCOUNTER — Other Ambulatory Visit: Payer: Self-pay | Admitting: Nurse Practitioner

## 2014-02-06 NOTE — Telephone Encounter (Signed)
Refill per protocol, next appt Feb 2016

## 2014-02-28 DIAGNOSIS — S0501XA Injury of conjunctiva and corneal abrasion without foreign body, right eye, initial encounter: Secondary | ICD-10-CM | POA: Diagnosis not present

## 2014-03-01 DIAGNOSIS — H16001 Unspecified corneal ulcer, right eye: Secondary | ICD-10-CM | POA: Diagnosis not present

## 2014-03-05 DIAGNOSIS — H16042 Marginal corneal ulcer, left eye: Secondary | ICD-10-CM | POA: Diagnosis not present

## 2014-03-07 DIAGNOSIS — H16042 Marginal corneal ulcer, left eye: Secondary | ICD-10-CM | POA: Diagnosis not present

## 2014-03-12 ENCOUNTER — Telehealth: Payer: Self-pay | Admitting: Family Medicine

## 2014-03-12 NOTE — Telephone Encounter (Signed)
Appointment given for Dr. Sabra Heck on 2/25

## 2014-03-14 DIAGNOSIS — H16042 Marginal corneal ulcer, left eye: Secondary | ICD-10-CM | POA: Diagnosis not present

## 2014-03-19 DIAGNOSIS — S0501XA Injury of conjunctiva and corneal abrasion without foreign body, right eye, initial encounter: Secondary | ICD-10-CM | POA: Diagnosis not present

## 2014-03-20 DIAGNOSIS — J4540 Moderate persistent asthma, uncomplicated: Secondary | ICD-10-CM | POA: Diagnosis not present

## 2014-03-20 DIAGNOSIS — J309 Allergic rhinitis, unspecified: Secondary | ICD-10-CM | POA: Diagnosis not present

## 2014-04-12 ENCOUNTER — Other Ambulatory Visit: Payer: Self-pay

## 2014-04-12 NOTE — Telephone Encounter (Signed)
Last seen 10/11/13 Dr Sabra Heck  Upcoming appt 04/19/14 Dr. Sabra Heck  If approved [facility]

## 2014-04-13 MED ORDER — ALPRAZOLAM 0.5 MG PO TABS: 0.5000 mg | ORAL_TABLET | Freq: Every evening | ORAL | Status: DC | PRN

## 2014-04-13 NOTE — Telephone Encounter (Signed)
rx called into pharmacy

## 2014-04-19 ENCOUNTER — Encounter: Payer: Self-pay | Admitting: Family Medicine

## 2014-04-19 ENCOUNTER — Ambulatory Visit (INDEPENDENT_AMBULATORY_CARE_PROVIDER_SITE_OTHER): Payer: Medicare Other | Admitting: Family Medicine

## 2014-04-19 VITALS — BP 144/90 | HR 71 | Temp 98.3°F | Ht 69.0 in | Wt 202.0 lb

## 2014-04-19 DIAGNOSIS — M1990 Unspecified osteoarthritis, unspecified site: Secondary | ICD-10-CM | POA: Diagnosis not present

## 2014-04-19 DIAGNOSIS — E785 Hyperlipidemia, unspecified: Secondary | ICD-10-CM

## 2014-04-19 DIAGNOSIS — N400 Enlarged prostate without lower urinary tract symptoms: Secondary | ICD-10-CM

## 2014-04-19 DIAGNOSIS — E039 Hypothyroidism, unspecified: Secondary | ICD-10-CM

## 2014-04-19 NOTE — Progress Notes (Signed)
Subjective:    Patient ID: [name], male    DOB: Jun 11, 1940, 74 y.o.   MRN: [id number]  HPI  74 year old man here for follow-up depression BPH, hypothyroidism, hyperlipidemia, and asthma. Since his last visit he tapered himself off Prozac and does not see any difference but he still remains somewhat depressed and lonely and seemed like many days he doesn't have a reason for living. I encouraged him to get back into an exercise routine which he used to do religiously but because of plantar fasciitis and shoulder pain he got away from the exercise. He is now starting back on [employer].  He used to see the urologist regarding his benign prostatic hypertrophy and has had a TURP in the past. Symptoms are now mostly weak stream and frequency.  He still uses alprazolam mostly at bedtime to help him sleep.  He sees allergist in [place] regarding his asthma recently had cough and was placed on Prilosec which resolved the cough so he was having reflux area    Review of Systems  Constitutional: Positive for unexpected weight change.  HENT: Negative.   Respiratory: Positive for cough.   Cardiovascular: Negative.   Gastrointestinal: Negative.   Genitourinary: Positive for frequency.  Neurological: Negative.    Patient Active Problem List   Diagnosis Date Noted  . Annual physical exam 01/24/2013  . Need for pneumococcal vaccination 01/24/2013  . Arthritis   . Headache(784.0) 12/20/2012  . Cervical radiculitis 12/20/2012  . Degenerative arthritis of cervical spine 11/15/2012  . Medication monitoring encounter 11/15/2012  . Unspecified vitamin D deficiency 07/22/2012  . Depression 07/22/2012  . OAB (overactive bladder) 07/22/2012  . Unspecified asthma(493.90) 07/22/2012  . BPH (benign prostatic hyperplasia) 07/22/2012  . Hypothyroidism 07/22/2012  . HLD (hyperlipidemia) 07/22/2012  . Allergic rhinitis due to pollen 05/06/2010  . IRRITABLE BOWEL SYNDROME 10/05/2007  .  Allergic-infective asthma 05/02/2007   Outpatient Encounter Prescriptions as of 04/19/2014  Medication Sig  . ALPRAZolam (XANAX) 0.5 MG tablet Take 1 tablet (0.5 mg total) by mouth at bedtime as needed for anxiety.  Marland Kitchen aspirin 81 MG tablet Take 81 mg by mouth daily.  . Cholecalciferol (VITAMIN D) 2000 UNITS CAPS Take 1 capsule by mouth daily.    . Fluticasone-Salmeterol (ADVAIR) 250-50 MCG/DOSE AEPB Inhale 1 puff into the lungs every 12 (twelve) hours.  Marland Kitchen levothyroxine (SYNTHROID, LEVOTHROID) 88 MCG tablet TAKE 1 TABLET EVERY DAY  . Multiple Vitamin (MULTIVITAMIN) tablet Take 1 tablet by mouth daily.    Marland Kitchen omeprazole (PRILOSEC) 20 MG capsule Take 20 mg by mouth daily.  . simvastatin (ZOCOR) 20 MG tablet TAKE 1 TABLET AT BEDTIME  . [DISCONTINUED] hydrocortisone (ANUSOL-HC) 25 MG suppository Place 25 mg rectally as needed.  . [DISCONTINUED] albuterol (PROAIR HFA) 108 (90 BASE) MCG/ACT inhaler 2 puffs every 4 hours if needed- rescue inhaler  . [DISCONTINUED] Coenzyme Q10 (CO Q 10) 100 MG CAPS Take 200 mg by mouth daily.   . [DISCONTINUED] EPINEPHrine 0.3 mg/0.3 mL IJ SOAJ injection Inject 0.3 mg into the muscle as needed.  . [DISCONTINUED] FLUoxetine (PROZAC) 20 MG tablet Take 1 tablet (20 mg total) by mouth daily.  . [DISCONTINUED] simvastatin (ZOCOR) 20 MG tablet TAKE 1 TABLET AT BEDTIME       Objective:   Physical Exam  Constitutional: He is oriented to person, place, and time. He appears well-developed.  Cardiovascular: Normal rate.   Pulmonary/Chest: Effort normal.  Abdominal: Soft.  Musculoskeletal: Normal range of motion.  Neurological: He is alert  and oriented to person, place, and time.          Assessment & Plan:  1. HLD (hyperlipidemia)  - CMP14+EGFR - Lipid panel  2. Arthritis   3. Hypothyroidism, unspecified hypothyroidism type  - TSH  4. BPH (benign prostatic hyperplasia)   Wardell Honour MD - PSA, total and free

## 2014-04-20 LAB — LIPID PANEL
CHOLESTEROL TOTAL: 149 mg/dL (ref 100–199)
Chol/HDL Ratio: 4 ratio units (ref 0.0–5.0)
HDL: 37 mg/dL — AB (ref >39)
LDL Calculated: 84 mg/dL (ref 0–99)
TRIGLYCERIDES: 139 mg/dL (ref 0–149)
VLDL Cholesterol Cal: 28 mg/dL (ref 5–40)

## 2014-04-20 LAB — CMP14+EGFR
ALT: 27 IU/L (ref 0–44)
AST: 27 IU/L (ref 0–40)
Albumin/Globulin Ratio: 1.9 (ref 1.1–2.5)
Albumin: 4.8 g/dL (ref 3.5–4.8)
Alkaline Phosphatase: 85 IU/L (ref 39–117)
BUN / CREAT RATIO: 15 (ref 10–22)
BUN: 12 mg/dL (ref 8–27)
Bilirubin Total: 0.7 mg/dL (ref 0.0–1.2)
CALCIUM: 9.4 mg/dL (ref 8.6–10.2)
CO2: 23 mmol/L (ref 18–29)
CREATININE: 0.81 mg/dL (ref 0.76–1.27)
Chloride: 100 mmol/L (ref 97–108)
GFR calc Af Amer: 103 mL/min/{1.73_m2} (ref >59)
GFR calc non Af Amer: 89 mL/min/{1.73_m2} (ref >59)
Globulin, Total: 2.5 g/dL (ref 1.5–4.5)
Glucose: 103 mg/dL — ABNORMAL HIGH (ref 65–99)
Potassium: 4.2 mmol/L (ref 3.5–5.2)
Sodium: 141 mmol/L (ref 134–144)
Total Protein: 7.3 g/dL (ref 6.0–8.5)

## 2014-04-20 LAB — PSA, TOTAL AND FREE
PSA FREE PCT: 15.7 %
PSA, Free: 0.22 ng/mL
PSA: 1.4 ng/mL (ref 0.0–4.0)

## 2014-04-20 LAB — TSH: TSH: 2.16 u[IU]/mL (ref 0.450–4.500)

## 2014-05-08 ENCOUNTER — Other Ambulatory Visit: Payer: Self-pay | Admitting: Family Medicine

## 2014-05-09 ENCOUNTER — Other Ambulatory Visit: Payer: Self-pay | Admitting: *Deleted

## 2014-05-09 NOTE — Telephone Encounter (Signed)
Patient last seen in office on 04-19-14. Rx last filled on 04-13-14 fir #30. Please advise on refill. If approved please route to Pool A so [facility]

## 2014-05-10 MED ORDER — ALPRAZOLAM 0.5 MG PO TABS: 0.5000 mg | ORAL_TABLET | Freq: Every evening | ORAL | Status: DC | PRN

## 2014-05-10 NOTE — Telephone Encounter (Signed)
[facility] 

## 2014-05-15 DIAGNOSIS — J309 Allergic rhinitis, unspecified: Secondary | ICD-10-CM | POA: Diagnosis not present

## 2014-05-15 DIAGNOSIS — J4540 Moderate persistent asthma, uncomplicated: Secondary | ICD-10-CM | POA: Diagnosis not present

## 2014-06-07 ENCOUNTER — Other Ambulatory Visit: Payer: Self-pay | Admitting: *Deleted

## 2014-06-07 MED ORDER — SIMVASTATIN 20 MG PO TABS: 20.0000 mg | ORAL_TABLET | Freq: Every day | ORAL | Status: DC

## 2014-06-11 ENCOUNTER — Other Ambulatory Visit: Payer: Self-pay | Admitting: *Deleted

## 2014-06-11 MED ORDER — ALPRAZOLAM 0.5 MG PO TABS: 0.5000 mg | ORAL_TABLET | Freq: Every evening | ORAL | Status: DC | PRN

## 2014-06-11 NOTE — Telephone Encounter (Signed)
[facility] pt, last filled 05/15/14, last seen 04/19/14. [facility] if approved

## 2014-06-26 DIAGNOSIS — J309 Allergic rhinitis, unspecified: Secondary | ICD-10-CM | POA: Diagnosis not present

## 2014-07-04 DIAGNOSIS — R05 Cough: Secondary | ICD-10-CM | POA: Diagnosis not present

## 2014-07-04 DIAGNOSIS — J387 Other diseases of larynx: Secondary | ICD-10-CM | POA: Diagnosis not present

## 2014-07-05 ENCOUNTER — Other Ambulatory Visit: Payer: Self-pay | Admitting: Otolaryngology

## 2014-07-05 DIAGNOSIS — J9859 Other diseases of mediastinum, not elsewhere classified: Secondary | ICD-10-CM

## 2014-07-05 DIAGNOSIS — R059 Cough, unspecified: Secondary | ICD-10-CM

## 2014-07-05 DIAGNOSIS — R05 Cough: Secondary | ICD-10-CM

## 2014-07-09 ENCOUNTER — Other Ambulatory Visit: Payer: Self-pay | Admitting: Family Medicine

## 2014-07-09 NOTE — Telephone Encounter (Signed)
Last seen 04/19/14 Dr Sabra Heck  If approved route to nurse [facility]

## 2014-07-12 ENCOUNTER — Other Ambulatory Visit: Payer: Self-pay | Admitting: Otolaryngology

## 2014-07-12 ENCOUNTER — Ambulatory Visit
Admission: RE | Admit: 2014-07-12 | Discharge: 2014-07-12 | Disposition: A | Discharge status: Home / Self Care | Payer: Medicare Other | Source: Ambulatory Visit | Attending: Otolaryngology | Admitting: Otolaryngology

## 2014-07-12 DIAGNOSIS — J9859 Other diseases of mediastinum, not elsewhere classified: Secondary | ICD-10-CM

## 2014-07-12 DIAGNOSIS — J3800 Paralysis of vocal cords and larynx, unspecified: Secondary | ICD-10-CM

## 2014-07-12 DIAGNOSIS — R05 Cough: Secondary | ICD-10-CM | POA: Diagnosis not present

## 2014-07-12 DIAGNOSIS — R222 Localized swelling, mass and lump, trunk: Secondary | ICD-10-CM | POA: Diagnosis not present

## 2014-07-12 DIAGNOSIS — R059 Cough, unspecified: Secondary | ICD-10-CM

## 2014-07-12 DIAGNOSIS — R911 Solitary pulmonary nodule: Secondary | ICD-10-CM | POA: Diagnosis not present

## 2014-07-12 MED ORDER — IOHEXOL 300 MG/ML  SOLN
75.0000 mL | Freq: Once | INTRAMUSCULAR | Status: AC | PRN
  Administered 2014-07-12: 75 mL via INTRAVENOUS

## 2014-07-19 ENCOUNTER — Ambulatory Visit (INDEPENDENT_AMBULATORY_CARE_PROVIDER_SITE_OTHER): Payer: Medicare Other | Admitting: Internal Medicine

## 2014-07-19 ENCOUNTER — Encounter: Payer: Self-pay | Admitting: Internal Medicine

## 2014-07-19 VITALS — BP 138/92 | HR 86 | Ht 69.0 in | Wt 191.0 lb

## 2014-07-19 DIAGNOSIS — R911 Solitary pulmonary nodule: Secondary | ICD-10-CM | POA: Diagnosis not present

## 2014-07-19 DIAGNOSIS — Z87891 Personal history of nicotine dependence: Secondary | ICD-10-CM | POA: Insufficient documentation

## 2014-07-19 NOTE — Patient Instructions (Signed)
ICD-9-CM ICD-10-CM   1. Incidental lung nodule, greater than or equal to 16mm 793.11 R91.1   2. History of smoking for 1-2 years V15.82 Z87.891     Please do PET Scan Further advise basd on results of PET Scan

## 2014-07-19 NOTE — Progress Notes (Signed)
Subjective:    Patient ID: [name], male    DOB: 1940-12-03, 74 y.o.   MRN: [id number]  PCP Wardell Honour, MD   HPI   IOV 07/19/2014  Chief Complaint  Patient presents with  . Pulmonary Consult    Pt referred by Dr. Carmelina Peal for lung lesion.    74 year old male referred for pulmonary nodule right upper lobe 0.9 cm identified on CT scan of the chest 07/12/2014  History dates back to 2005 when he used to see Dr. Tarri Fuller young. At that time he had a right lower lobe solitary pulmonary nodule seen on chest x-ray and followed with another chest x-ray in 2010 that showed continued stability [I personally visualized these chest x-rays and confirmed them] most recently he has been having chronic cough he says since December 2015. He first saw allergist Dr. Neldon Mc for the same. Initially was treated with proton pump inhibitor which seem to help with the cough recurred. They subsequently got referred to Dr. Janace Hoard at [facility]. Ranitidine was started and this seemed to help. Due to chronic cough a subsequent imaging workup was done that involved a CT neck and the CT chest. The CT neck has been reported as normal. However the CT chest and back abnormal as reported below. Therefore he is here. He is worried about the lung cancer possibility.  At this point in time he denies any chest pain, shortness of breath, wheezing, hemoptysis, orthopnea, paroxysmal nocturnal dyspnea  Hx also obtained from Dr Myles Lipps record that show eval for cough etc., and confirms above hx. Curently coiugh is much better. Is mild. Is dry. No associated symptioms. Improved with GERD Rx. No specific aggravating factors  CT chest 07/12/14 - personally visualized image IMPRESSION: 1. Spiculated lesion in the anterior right upper lobe of 10 mm in diameter, worrisome for lung neoplasm. Consider PET-CT to assess for metabolic activity. 2. Small nodular lesions bilaterally could represent lung metastases. 3.  Minimally prominent right hilar nodes with somewhat prominent perifissural node on the right in close proximity to the spiculated lesion. Metastatic involvement cannot be excluded. 4. Left anterior descending coronary artery calcifications.   Electronically Signed  By: Ivar Drape M.D.  On: 07/12/2014 16:09    has a past medical history of Irritable bowel syndrome; Allergic rhinitis, cause unspecified; Unspecified asthma(493.90); Hyperlipidemia; Hypothyroidism; Kidney stones; Obesity; Arthritis; Allergy; Shoulder pain, right; and GERD (gastroesophageal reflux disease).   reports that he quit smoking about 45 years ago. His smoking use included Cigarettes. He has a 4 pack-year smoking history. He has never used smokeless tobacco.  Past Surgical History  Procedure Laterality Date  . Tonsillectomy    . Vasectomy    . Rotator cuff repair  2005    Left Shoulder  . Cataract extraction bilateral w/ anterior vitrectomy  2005  . Transurethral resection of prostate  2005    No Known Allergies  Immunization History  Administered Date(s) Administered  . Influenza Split 10/25/2011  . Influenza Whole 10/25/2010  . Influenza,inj,Quad PF,36+ Mos 11/15/2012, 11/28/2013  . Pneumococcal Conjugate-13 01/24/2013  . Pneumococcal Polysaccharide-23 09/17/2011  . Tdap 10/21/2010  . Zoster 03/15/2006    Family History  Problem Relation Age of Onset  . Breast cancer Mother   . Kidney disease Mother   . Uterine cancer Mother   . Diabetes Father   . Heart disease Father      Current outpatient prescriptions:  .  ALPRAZolam (XANAX) 0.5 MG tablet, TAKE 1 TAB AT  BEDTIME AS NEEDED, Disp: 30 tablet, Rfl: 0 .  aspirin 81 MG tablet, Take 81 mg by mouth daily., Disp: , Rfl:  .  Cholecalciferol (VITAMIN D) 2000 UNITS CAPS, Take 1 capsule by mouth daily.  , Disp: , Rfl:  .  Fluticasone-Salmeterol (ADVAIR) 250-50 MCG/DOSE AEPB, Inhale 1 puff into the lungs every 12 (twelve) hours., Disp: 60 each,  Rfl: prn .  levothyroxine (SYNTHROID, LEVOTHROID) 88 MCG tablet, TAKE 1 TABLET EVERY DAY, Disp: 30 tablet, Rfl: 10 .  Multiple Vitamin (MULTIVITAMIN) tablet, Take 1 tablet by mouth daily.  , Disp: , Rfl:  .  omeprazole (PRILOSEC) 20 MG capsule, Take 20 mg by mouth daily., Disp: , Rfl:  .  ranitidine (ZANTAC) 300 MG tablet, Take 300 mg by mouth at bedtime., Disp: , Rfl:  .  simvastatin (ZOCOR) 20 MG tablet, Take 1 tablet (20 mg total) by mouth at bedtime., Disp: 30 tablet, Rfl: 4     Review of Systems  Constitutional: Negative for fever and unexpected weight change.  HENT: Positive for congestion. Negative for dental problem, ear pain, nosebleeds, postnasal drip, rhinorrhea, sinus pressure, sneezing, sore throat and trouble swallowing.   Eyes: Negative for redness and itching.  Respiratory: Positive for cough. Negative for chest tightness, shortness of breath and wheezing.   Cardiovascular: Negative for palpitations and leg swelling.  Gastrointestinal: Negative for nausea and vomiting.  Genitourinary: Negative for dysuria.  Musculoskeletal: Negative for joint swelling.  Skin: Negative for rash.  Neurological: Negative for headaches.  Hematological: Does not bruise/bleed easily.  Psychiatric/Behavioral: Negative for dysphoric mood. The patient is not nervous/anxious.        Objective:   Physical Exam  Constitutional: He is oriented to person, place, and time. He appears well-developed and well-nourished. No distress.  HENT:  Head: Normocephalic and atraumatic.  Right Ear: External ear normal.  Left Ear: External ear normal.  Mouth/Throat: Oropharynx is clear and moist. No oropharyngeal exudate.  Eyes: Conjunctivae and EOM are normal. Pupils are equal, round, and reactive to light. Right eye exhibits no discharge. Left eye exhibits no discharge. No scleral icterus.  Neck: Normal range of motion. Neck supple. No JVD present. No tracheal deviation present. No thyromegaly present.    Cardiovascular: Normal rate, regular rhythm and intact distal pulses.  Exam reveals no gallop and no friction rub.   No murmur heard. Pulmonary/Chest: Effort normal and breath sounds normal. No respiratory distress. He has no wheezes. He has no rales. He exhibits no tenderness.  Abdominal: Soft. Bowel sounds are normal. He exhibits no distension and no mass. There is no tenderness. There is no rebound and no guarding.  Musculoskeletal: Normal range of motion. He exhibits no edema or tenderness.  Lymphadenopathy:    He has no cervical adenopathy.  Neurological: He is alert and oriented to person, place, and time. He has normal reflexes. No cranial nerve deficit. Coordination normal.  Skin: Skin is warm and dry. No rash noted. He is not diaphoretic. No erythema. No pallor.  Psychiatric:  mildlyt anxious  Nursing note and vitals reviewed.   Filed Vitals:   07/19/14 1535  BP: 138/92  Pulse: 86  Height: 5\' 9"  (1.753 m)  Weight: 191 lb (86.637 kg)  SpO2: 98%         Assessment & Plan:     ICD-9-CM ICD-10-CM   1. Incidental lung nodule, greater than or equal to 56mm 793.11 R91.1 NM PET Image Initial (PI) Skull Base To Thigh  2. History of smoking for  1-2 years V15.82 Z87.891 NM PET Image Initial (PI) Skull Base To Thigh   - Right upper lobe pulmonary nodule 0.9 cm and a almost nonsmoker: The official report is that it is spiculated. Based on the prevalence of 25% of nodules in our pulmonary clinic being lung cancer his pretest probably be of lung cancer for this nodule is around 60% at and otherwise its intermediate probably ready for lung cancer. Honestly I do not think this nodule is spiculated but is actually lobulated in which case the pretest probably of this lung nodule being lung cancer drops to 20%. In any event it is all intermediate probability  - I have counseled him as such and high have explained to him that there is need for noninvasive test such as PET scan followed by  possibly minimally invasive workup that might involve navigational bronchoscopy or preceptor bronchial brushing genomic test sees the probably give the lung nodule could be lowered to low probability. If we can successfully achieve this with a combination of noninvasive and minimally invasive tests then we can just put him on a surveillance program with serial CT scan of the chest for 2 years. He finds this news disappointing because he really wanted to hear that the possibly of his lung nodule being lung cancer was 0%. Nevertheless he understands and is willing to proceed.  - At this point we'll go ahead and get a PET scan   Problem #3#coronary artery calcification  - He is asymptomatic and this needs bedrest primary care physician  Dr. Brand Males, M.D., [facility].C.P Pulmonary and Critical Care Medicine Staff Physician [place] [facility] Pager: [phone], If no answer or between  15:00h - 7:00h: call [phone]  07/19/2014 4:26 PM

## 2014-07-20 ENCOUNTER — Telehealth: Payer: Self-pay | Admitting: Internal Medicine

## 2014-07-20 NOTE — Telephone Encounter (Signed)
Pt has asked if you will look at the Cervical Spine XR from 10/18/12 that Dr. Jacelyn Grip ordered and see if the scan goes down far enough to see if the nodule was there at that time. He states that if it was then maybe over the next 2 years he won;t have to do as many scans.  MR please advise for pt.  Pt is aware it will be after holiday before he will be contacted back.

## 2014-07-24 NOTE — Telephone Encounter (Signed)
Called and spoke to pt. Informed him of the recs per MR. Pt verbalized understanding and denied any further questions or concerns at this time.   

## 2014-07-24 NOTE — Telephone Encounter (Signed)
MR please advise. Thanks! 

## 2014-07-24 NOTE — Telephone Encounter (Signed)
The spine XRay did not look for lung nodule. Unfortunately the spine xray in 2014 is not helpful

## 2014-07-27 ENCOUNTER — Ambulatory Visit (HOSPITAL_COMMUNITY)
Admission: RE | Admit: 2014-07-27 | Discharge: 2014-07-27 | Disposition: A | Discharge status: Home / Self Care | Payer: Medicare Other | Source: Ambulatory Visit | Attending: Internal Medicine | Admitting: Internal Medicine

## 2014-07-27 DIAGNOSIS — Z87891 Personal history of nicotine dependence: Secondary | ICD-10-CM | POA: Diagnosis not present

## 2014-07-27 DIAGNOSIS — R911 Solitary pulmonary nodule: Secondary | ICD-10-CM | POA: Diagnosis present

## 2014-07-27 DIAGNOSIS — I2510 Atherosclerotic heart disease of native coronary artery without angina pectoris: Secondary | ICD-10-CM | POA: Insufficient documentation

## 2014-07-27 DIAGNOSIS — R918 Other nonspecific abnormal finding of lung field: Secondary | ICD-10-CM | POA: Insufficient documentation

## 2014-07-27 LAB — GLUCOSE, CAPILLARY: Glucose-Capillary: 99 mg/dL (ref 65–99)

## 2014-07-27 MED ORDER — FLUDEOXYGLUCOSE F - 18 (FDG) INJECTION
9.5900 | Freq: Once | INTRAVENOUS | Status: AC | PRN
  Administered 2014-07-27: 9.59 via INTRAVENOUS

## 2014-07-31 ENCOUNTER — Other Ambulatory Visit: Payer: Self-pay | Admitting: Family Medicine

## 2014-07-31 ENCOUNTER — Telehealth: Payer: Self-pay | Admitting: Internal Medicine

## 2014-07-31 DIAGNOSIS — R911 Solitary pulmonary nodule: Secondary | ICD-10-CM

## 2014-07-31 DIAGNOSIS — Z961 Presence of intraocular lens: Secondary | ICD-10-CM | POA: Diagnosis not present

## 2014-07-31 NOTE — Telephone Encounter (Signed)
Called and spoke to pt. Pt requesting the results of PET scan. Pt anxious.   MR please advise.

## 2014-07-31 NOTE — Telephone Encounter (Signed)
Called and spoke to pt. Informed pt of the results and recs. CT order placed and OV recall. Pt denied wanting a bronch with brushing. Pt denied any further questions or concerns at this time.

## 2014-07-31 NOTE — Telephone Encounter (Signed)
Please let him know  A) that i called home number 10:34 AM personally to give result but said "not answering"  B) PET scan is negative for any uptake of the 49mm nodule - this makes it low prob for malignancy. This means needs watching.   Rec  a) repeat CT chest wo contrast 3 months from 07/12/14 and ROV with me in 3 months B) if he still is anxious I can offer bronch with brushings with a new test called percepta that can help with cancer probability or biopsy- IF he is interested in hearing more about this let me know and I Can call him sometime this week  Dr. Brand Males, M.D., [facility].C.P Pulmonary and Critical Care Medicine Staff Physician [place] [facility] Pager: [phone], If no answer or between  15:00h - 7:00h: call [phone]  07/31/2014 10:37 AM

## 2014-07-31 NOTE — Telephone Encounter (Signed)
Last seen 04/19/14 Dr Sabra Heck If approved route to [facility]

## 2014-08-02 NOTE — Telephone Encounter (Signed)
Left authorization on pharmacy voicemail. 

## 2014-08-02 NOTE — Telephone Encounter (Signed)
This is okay to refill 

## 2014-08-29 ENCOUNTER — Other Ambulatory Visit: Payer: Self-pay | Admitting: Family Medicine

## 2014-08-29 NOTE — Telephone Encounter (Signed)
Last seen 04/19/14 Dr Sabra Heck  If approved route to [facility]  Upcoming appt 10/24/14

## 2014-08-31 NOTE — Telephone Encounter (Signed)
Okayed 1 month

## 2014-08-31 NOTE — Telephone Encounter (Signed)
rx called into pharmacy

## 2014-09-18 ENCOUNTER — Encounter: Payer: Self-pay | Admitting: *Deleted

## 2014-09-27 ENCOUNTER — Telehealth: Payer: Self-pay | Admitting: Internal Medicine

## 2014-09-27 NOTE — Telephone Encounter (Signed)
CT chest order is already in epic. Please advise PCC's thanks

## 2014-09-28 NOTE — Telephone Encounter (Signed)
CT Chest Sche  10/19/14 2:00 Patient is aware and he requested [facility]

## 2014-10-16 ENCOUNTER — Ambulatory Visit
Admission: RE | Admit: 2014-10-16 | Discharge: 2014-10-16 | Disposition: A | Discharge status: Home / Self Care | Payer: Medicare Other | Source: Ambulatory Visit | Attending: Internal Medicine | Admitting: Internal Medicine

## 2014-10-16 DIAGNOSIS — J4540 Moderate persistent asthma, uncomplicated: Secondary | ICD-10-CM | POA: Diagnosis not present

## 2014-10-16 DIAGNOSIS — I2510 Atherosclerotic heart disease of native coronary artery without angina pectoris: Secondary | ICD-10-CM | POA: Diagnosis not present

## 2014-10-16 DIAGNOSIS — R918 Other nonspecific abnormal finding of lung field: Secondary | ICD-10-CM | POA: Diagnosis not present

## 2014-10-16 DIAGNOSIS — R911 Solitary pulmonary nodule: Secondary | ICD-10-CM

## 2014-10-16 DIAGNOSIS — J309 Allergic rhinitis, unspecified: Secondary | ICD-10-CM | POA: Diagnosis not present

## 2014-10-16 DIAGNOSIS — M3500 Sicca syndrome, unspecified: Secondary | ICD-10-CM | POA: Diagnosis not present

## 2014-10-18 ENCOUNTER — Telehealth: Payer: Self-pay | Admitting: Internal Medicine

## 2014-10-18 DIAGNOSIS — R911 Solitary pulmonary nodule: Secondary | ICD-10-CM

## 2014-10-18 NOTE — Telephone Encounter (Signed)
Called and spoke to pt. Reviewed results and recs. Pt voiced understanding and had no further questions.  CT chest without contrast was placed for 9 months and pt was put on 9 month recall for follow up with MR. Nothing further needed.

## 2014-10-18 NOTE — Telephone Encounter (Signed)
FU CT chest from may 2016 for [name]   IMPRESSION: 10/16/14 1. The right upper lobe spiculated nodule shown on the prior exam from 07/12/2014 is no longer present.   2. SOme very very small otherer nodules are predominantly stable. A variety of these are pleural-based - likely benign  PLAN Ct chest wo contrast in 9 months and return to office for followup

## 2014-10-19 ENCOUNTER — Encounter: Payer: Self-pay | Admitting: Internal Medicine

## 2014-10-19 ENCOUNTER — Other Ambulatory Visit: Payer: Medicare Other

## 2014-10-22 ENCOUNTER — Other Ambulatory Visit: Payer: Medicare Other

## 2014-10-23 ENCOUNTER — Ambulatory Visit (INDEPENDENT_AMBULATORY_CARE_PROVIDER_SITE_OTHER): Payer: Medicare Other | Admitting: Family Medicine

## 2014-10-23 ENCOUNTER — Encounter: Payer: Self-pay | Admitting: Family Medicine

## 2014-10-23 VITALS — BP 132/88 | HR 71 | Temp 98.2°F | Ht 69.0 in | Wt 192.0 lb

## 2014-10-23 DIAGNOSIS — E039 Hypothyroidism, unspecified: Secondary | ICD-10-CM

## 2014-10-23 DIAGNOSIS — E785 Hyperlipidemia, unspecified: Secondary | ICD-10-CM

## 2014-10-23 MED ORDER — ALPRAZOLAM 0.5 MG PO TABS: 0.5000 mg | ORAL_TABLET | Freq: Two times a day (BID) | ORAL | Status: DC

## 2014-10-23 NOTE — Progress Notes (Signed)
Subjective:    Patient ID: [name], male    DOB: 1940-11-12, 74 y.o.   MRN: [id number]  HPI 74 year old gentleman is here to follow-up hyperlipidemia hypothyroidism and anxiety depression. He continues to be depressed not finding much purpose in life since his wife died except taking care of some animals. Has recently been tested by his pulmonologist. He was initially found to have lung nodules that were there and then not there'll subsequent scans CT scans as well as PET scans. He is frustrated by this. When he was told that he may have metastatic lung cancer he became even more sad which is not consistent with his desire not to go on. I think that probably deep down there are some feelings of worse but he does not verbalize those.  He also complains of some general dryness to his upper nasal passages including nose throat as well as around his eyes. We talked about humidity in his house and when he runs a vaporizer symptoms seem to be better. Sounds like he may have had some blood work submitted by his allergist to rule out Sjogren's syndrome.  Patient Active Problem List   Diagnosis Date Noted  . Incidental lung nodule, greater than or equal to 57mm 07/19/2014  . History of smoking for 1-2 years 07/19/2014  . Annual physical exam 01/24/2013  . Need for pneumococcal vaccination 01/24/2013  . Arthritis   . Headache(784.0) 12/20/2012  . Cervical radiculitis 12/20/2012  . Degenerative arthritis of cervical spine 11/15/2012  . Medication monitoring encounter 11/15/2012  . Unspecified vitamin D deficiency 07/22/2012  . Depression 07/22/2012  . OAB (overactive bladder) 07/22/2012  . Unspecified asthma(493.90) 07/22/2012  . BPH (benign prostatic hyperplasia) 07/22/2012  . Hypothyroidism 07/22/2012  . HLD (hyperlipidemia) 07/22/2012  . Allergic rhinitis due to pollen 05/06/2010  . IRRITABLE BOWEL SYNDROME 10/05/2007  . Allergic-infective asthma 05/02/2007   Outpatient Encounter  Prescriptions as of 10/23/2014  Medication Sig  . ADVAIR HFA 115-21 MCG/ACT inhaler Inhale 2 puffs into the lungs 2 (two) times daily.  Marland Kitchen ALPRAZolam (XANAX) 0.5 MG tablet TAKE 1 TABLET BY MOUTH AT BEDTIME AS NEEDED  . aspirin 81 MG tablet Take 81 mg by mouth daily.  . Cholecalciferol (VITAMIN D) 2000 UNITS CAPS Take 1 capsule by mouth daily.    Marland Kitchen levothyroxine (SYNTHROID, LEVOTHROID) 88 MCG tablet TAKE 1 TABLET EVERY DAY  . Multiple Vitamin (MULTIVITAMIN) tablet Take 1 tablet by mouth daily.    Marland Kitchen omeprazole (PRILOSEC) 20 MG capsule Take 20 mg by mouth 2 (two) times daily before a meal.   . ranitidine (ZANTAC) 300 MG tablet Take 300 mg by mouth at bedtime.  . simvastatin (ZOCOR) 20 MG tablet Take 1 tablet (20 mg total) by mouth at bedtime.  . [DISCONTINUED] Fluticasone-Salmeterol (ADVAIR) 250-50 MCG/DOSE AEPB Inhale 1 puff into the lungs every 12 (twelve) hours.  . [DISCONTINUED] NON FORMULARY Allergy vaccines 1:10 weekly GO   No facility-administered encounter medications on file as of 10/23/2014.       Review of Systems  HENT: Positive for postnasal drip.   Respiratory: Positive for cough.   Cardiovascular: Negative.   Psychiatric/Behavioral: Positive for sleep disturbance. The patient is nervous/anxious.        Objective:   Physical Exam  Constitutional: He is oriented to person, place, and time. He appears well-developed and well-nourished.  Cardiovascular: Normal rate and regular rhythm.   Pulmonary/Chest: Effort normal and breath sounds normal.  Neurological: He is alert and oriented to  person, place, and time.  Psychiatric: He has a normal mood and affect. His behavior is normal.     BP 132/88 mmHg  Pulse 71  Temp(Src) 98.2 F (36.8 C) (Oral)  Ht 5\' 9"  (1.753 m)  Wt 192 lb (87.091 kg)  BMI 28.34 kg/m2      Assessment & Plan:  1. HLD (hyperlipidemia) Lipids controlled on simvastatin. Was last checked 6 months ago. Will wait another 6 months before repeating  2.  Hypothyroidism, unspecified hypothyroidism type He is concerned that use of PPIs may be influencing his thyroid status and ask to check TSH today  Wardell Honour MD - TSH

## 2014-10-24 ENCOUNTER — Ambulatory Visit: Payer: Medicare Other | Admitting: Family Medicine

## 2014-10-24 LAB — TSH: TSH: 2.41 u[IU]/mL (ref 0.450–4.500)

## 2014-10-31 ENCOUNTER — Other Ambulatory Visit: Payer: Self-pay | Admitting: *Deleted

## 2014-10-31 MED ORDER — SIMVASTATIN 20 MG PO TABS: 20.0000 mg | ORAL_TABLET | Freq: Every day | ORAL | Status: DC

## 2014-11-01 ENCOUNTER — Encounter: Payer: Self-pay | Admitting: *Deleted

## 2014-11-22 ENCOUNTER — Telehealth: Payer: Self-pay | Admitting: Allergy and Immunology

## 2014-11-22 NOTE — Telephone Encounter (Signed)
See note

## 2014-11-22 NOTE — Telephone Encounter (Signed)
Pt called wanting some advice. He is still struggling.  Was wondering if we could advise some sort of air quality test for home or an air purifier.   He has had his duct work cleaned out recently and is trying to be proactive in the home.  Feels like when we walks in his home his breathing gets worse.

## 2014-11-23 NOTE — Telephone Encounter (Signed)
Patient advised. He voices understanding.

## 2014-11-23 NOTE — Telephone Encounter (Signed)
Please inform patient that I do not think the money spent for a test would be money well spent. If he would like to improve the air quality of his house he should consider either free standing HEPA filters or in line HVAC HEPA filters.

## 2014-11-23 NOTE — Telephone Encounter (Signed)
Left message on voicemail for patient to return call. 

## 2014-11-27 ENCOUNTER — Other Ambulatory Visit: Payer: Self-pay

## 2014-11-27 NOTE — Telephone Encounter (Signed)
Last seen 10/23/14 Dr Sabra Heck last lipid 04/19/14

## 2014-11-28 MED ORDER — SIMVASTATIN 20 MG PO TABS: 20.0000 mg | ORAL_TABLET | Freq: Every day | ORAL | Status: DC

## 2014-12-26 ENCOUNTER — Other Ambulatory Visit: Payer: Self-pay | Admitting: *Deleted

## 2014-12-26 MED ORDER — ALPRAZOLAM 0.5 MG PO TABS: 0.5000 mg | ORAL_TABLET | Freq: Two times a day (BID) | ORAL | Status: DC

## 2014-12-26 NOTE — Telephone Encounter (Signed)
Last filled 11/26/14, last seen 10/23/14. [facility]

## 2015-01-01 ENCOUNTER — Telehealth: Payer: Self-pay | Admitting: Internal Medicine

## 2015-01-01 NOTE — Telephone Encounter (Signed)
Pt.'s last ov 09/16/12:.cont. Vac.Marland Kitchen His last request for vac. Was 02/22/13. Do we consider him d/c'd? Please advise.

## 2015-01-02 NOTE — Telephone Encounter (Signed)
Noted. Nothing further needed. 

## 2015-01-02 NOTE — Telephone Encounter (Signed)
Yes- dc'd

## 2015-01-22 ENCOUNTER — Other Ambulatory Visit: Payer: Self-pay | Admitting: Family Medicine

## 2015-01-22 NOTE — Telephone Encounter (Signed)
Patient aware that [facility]

## 2015-01-22 NOTE — Telephone Encounter (Signed)
Last seen 10/23/14  Dr Sabra Heck   Last lipid 04/19/14

## 2015-01-29 ENCOUNTER — Ambulatory Visit (INDEPENDENT_AMBULATORY_CARE_PROVIDER_SITE_OTHER): Payer: Medicare Other | Admitting: Allergy and Immunology

## 2015-01-29 ENCOUNTER — Encounter: Payer: Self-pay | Admitting: Allergy and Immunology

## 2015-01-29 VITALS — BP 150/98 | HR 80 | Resp 16

## 2015-01-29 DIAGNOSIS — T485X1A Poisoning by other anti-common-cold drugs, accidental (unintentional), initial encounter: Secondary | ICD-10-CM

## 2015-01-29 DIAGNOSIS — K219 Gastro-esophageal reflux disease without esophagitis: Secondary | ICD-10-CM

## 2015-01-29 DIAGNOSIS — J387 Other diseases of larynx: Secondary | ICD-10-CM | POA: Diagnosis not present

## 2015-01-29 DIAGNOSIS — J4540 Moderate persistent asthma, uncomplicated: Secondary | ICD-10-CM | POA: Diagnosis not present

## 2015-01-29 DIAGNOSIS — J3089 Other allergic rhinitis: Secondary | ICD-10-CM

## 2015-01-29 DIAGNOSIS — T485X5A Adverse effect of other anti-common-cold drugs, initial encounter: Secondary | ICD-10-CM

## 2015-01-29 DIAGNOSIS — J310 Chronic rhinitis: Secondary | ICD-10-CM | POA: Diagnosis not present

## 2015-01-29 DIAGNOSIS — J4530 Mild persistent asthma, uncomplicated: Secondary | ICD-10-CM | POA: Insufficient documentation

## 2015-01-29 DIAGNOSIS — J4520 Mild intermittent asthma, uncomplicated: Secondary | ICD-10-CM | POA: Insufficient documentation

## 2015-01-29 NOTE — Progress Notes (Signed)
[name]  Follow-up Note  Refering Provider: Wardell Honour, MD Primary Provider: Wardell Honour, MD  Subjective:   [name] is a 74 y.o. male who returns to the [facility] in re-evaluation of the following:  HPI Comments:  [name] returns to this clinic on 01/29/2015 in reevaluation of his respiratory tract issues. He has had dramatic improvement regarding his coughing. He now only has episodic coughing about every 4 weeks. This improvement appears to correlate with the increase in his to omeprazole to 20 mg twice a day while continuing on ranitidine at nighttime. He's had very little issues with asthma and has no shortness of breath and no need to use a short acting bronchodilator and is not using systemic steroids in the past 6 months to treat his asthma. He does have sneezing spells and is not using a nasal steroid at this point in time. He does continue on his Afrin every night as he has done so for the past 25 years. Since switching from Advair Diskus to Advair MDI his sore tongue issue has basically resolved. He still has dry eye and dry nose and he is using some saline flushes. I checked a ANA back in August 2016 which was negative for possible Sjogren's syndrome. Apparently his lung nodules have resolved with a follow-up CT scan. He did receive the flu vaccine.   Outpatient Encounter Prescriptions as of 01/29/2015  Medication Sig  . ADVAIR HFA 115-21 MCG/ACT inhaler Inhale 2 puffs into the lungs 2 (two) times daily.  Marland Kitchen ALPRAZolam (XANAX) 0.5 MG tablet Take 1 tablet (0.5 mg total) by mouth 2 (two) times daily.  Marland Kitchen aspirin 81 MG tablet Take 81 mg by mouth daily.  . Cholecalciferol (VITAMIN D) 2000 UNITS CAPS Take 1 capsule by mouth daily.    Marland Kitchen levothyroxine (SYNTHROID, LEVOTHROID) 88 MCG tablet TAKE 1 TABLET EVERY DAY  . Multiple Vitamin (MULTIVITAMIN) tablet Take 1 tablet by mouth daily.    Marland Kitchen  omeprazole (PRILOSEC) 20 MG capsule Take 20 mg by mouth 2 (two) times daily before a meal.   . ranitidine (ZANTAC) 300 MG tablet Take 300 mg by mouth at bedtime.  . simvastatin (ZOCOR) 20 MG tablet TAKE 1 TABLET (20 MG TOTAL) BY MOUTH AT BEDTIME.   No facility-administered encounter medications on file as of 01/29/2015.    No orders of the defined types were placed in this encounter.    Past Medical History  Diagnosis Date  . Irritable bowel syndrome   . Allergic rhinitis, cause unspecified   . Unspecified asthma(493.90)   . Hyperlipidemia   . Hypothyroidism   . Kidney stones     per pt, not sure if had kidney stones.  . Obesity   . Arthritis     degenerative arthritis of the neck  . Allergy     a lot seasonal allergies  . Shoulder pain, right   . GERD (gastroesophageal reflux disease)     Past Surgical History  Procedure Laterality Date  . Tonsillectomy    . Vasectomy    . Rotator cuff repair  2005    Left Shoulder  . Cataract extraction bilateral w/ anterior vitrectomy  2005  . Transurethral resection of prostate  2005    No Known Allergies  Review of Systems  Constitutional: Negative for fever, chills and fatigue.  HENT: Negative for congestion, ear pain, facial swelling, hearing loss, nosebleeds, postnasal drip, rhinorrhea, sinus  pressure, sneezing, sore throat, tinnitus, trouble swallowing and voice change.   Eyes: Negative for pain, discharge, redness and itching.  Respiratory: Positive for cough. Negative for chest tightness, shortness of breath and wheezing.   Cardiovascular: Negative for chest pain and leg swelling.  Gastrointestinal: Negative for nausea, vomiting and abdominal pain.  Musculoskeletal: Negative for myalgias and arthralgias.  Skin: Negative for rash.  Allergic/Immunologic: Negative.   Neurological: Negative for dizziness and headaches.  Hematological: Negative for adenopathy.     Objective:   Filed Vitals:   01/29/15 1427  BP: 150/98   Pulse: 80  Resp: 16          Physical Exam  Constitutional: He appears well-developed and well-nourished. No distress.  HENT:  Head: Normocephalic and atraumatic. Head is without right periorbital erythema and without left periorbital erythema.  Right Ear: Tympanic membrane, external ear and ear canal normal. No drainage or tenderness. No foreign bodies. Tympanic membrane is not injected, not scarred, not perforated, not erythematous, not retracted and not bulging. No middle ear effusion.  Left Ear: Tympanic membrane, external ear and ear canal normal. No drainage or tenderness. No foreign bodies. Tympanic membrane is not injected, not scarred, not perforated, not erythematous, not retracted and not bulging.  No middle ear effusion.  Nose: Nose normal. No mucosal edema, rhinorrhea, nose lacerations or sinus tenderness.  No foreign bodies.  Mouth/Throat: Oropharynx is clear and moist. No oropharyngeal exudate, posterior oropharyngeal edema, posterior oropharyngeal erythema or tonsillar abscesses.  Eyes: Lids are normal. Right eye exhibits no chemosis, no discharge and no exudate. No foreign body present in the right eye. Left eye exhibits no chemosis, no discharge and no exudate. No foreign body present in the left eye. Right conjunctiva is not injected. Left conjunctiva is not injected.  Neck: Neck supple. No tracheal tenderness present. No tracheal deviation and no edema present. No thyroid mass and no thyromegaly present.  Cardiovascular: Normal rate, regular rhythm, S1 normal and S2 normal.  Exam reveals no gallop.   No murmur heard. Pulmonary/Chest: No accessory muscle usage or stridor. No respiratory distress. He has no wheezes. He has no rhonchi. He has no rales.  Abdominal: Soft.  Lymphadenopathy:       Head (right side): No tonsillar adenopathy present.       Head (left side): No tonsillar adenopathy present.    He has no cervical adenopathy.  Neurological: He is alert.  Skin: No  rash noted. He is not diaphoretic.  Psychiatric: He has a normal mood and affect. His behavior is normal.    Diagnostics:    Spirometry was performed and demonstrated an FEV1 of 3.46 at 116 % of predicted.  The patient had an Asthma Control Test with the following results: ACT Total Score: 24.    Assessment and Plan:   1. Moderate persistent asthma, uncomplicated   2. LPRD (laryngopharyngeal reflux disease)   3. Other allergic rhinitis   4. Rhinitis medicamentosa      1. Continue omeprazole 20 mg twice a day  2. May attempt to discontinue her ranitidine at nighttime  3. Continue Advair 115 2 inhalations twice a day with spacer device  4. Start OTC Rhinocort one spray each nostril one time per day  5. Continue nasal saline wash, Systane eyedrops, Afrin, and ProAir or if needed  6. Return to clinic in 6 months or earlier if a problem  Juell has done relatively well on his current medical therapy and we'll see we can consolidate his  treatment by discontinuing his ranitidine while he continues to use omeprazole 20 mg twice a day as well as his anti-inflammatory medications which at this point will include Advair and Rhinocort. If he does well we will see him back in this clinic in 6 months or earlier if there is a problem.   Allena Katz, MD [place]

## 2015-01-29 NOTE — Patient Instructions (Signed)
  1. Continue omeprazole 20 mg twice a day  2. May attempt to discontinue her ranitidine at nighttime  3. Continue Advair 115 2 inhalations twice a day with spacer device  4. Start OTC Rhinocort one spray each nostril one time per day  5. Continue nasal saline wash, Systane eyedrops, Afrin, and ProAir or if needed  6. Return to clinic in 6 months or earlier if a problem

## 2015-01-30 ENCOUNTER — Telehealth: Payer: Self-pay | Admitting: Allergy and Immunology

## 2015-01-30 NOTE — Telephone Encounter (Signed)
Patient is asking if he should take this medication due to surgery.

## 2015-01-30 NOTE — Telephone Encounter (Signed)
Called and notified patient. Patient will contact us back if any questions.

## 2015-01-30 NOTE — Telephone Encounter (Signed)
Pt called and said that the rhinocort paperwork said that if you have had cataract surgery you should not use this. [phone].

## 2015-01-30 NOTE — Telephone Encounter (Signed)
Please inform patient that Rhinocort should be safe to use. Use it at least three times per week.

## 2015-02-14 ENCOUNTER — Other Ambulatory Visit: Payer: Self-pay | Admitting: Neurology

## 2015-02-14 MED ORDER — ADVAIR HFA 115-21 MCG/ACT IN AERO: 2.0000 | INHALATION_SPRAY | Freq: Two times a day (BID) | RESPIRATORY_TRACT | Status: DC

## 2015-02-26 ENCOUNTER — Other Ambulatory Visit: Payer: Self-pay

## 2015-02-26 NOTE — Telephone Encounter (Signed)
Patient of Dr Sabra Heck. Last appt was 10-23-14. Alprazolam last filled on 01-25-15 for #60. Please advise and route to pool A so [facility] can [facility]

## 2015-02-28 ENCOUNTER — Other Ambulatory Visit: Payer: Self-pay | Admitting: *Deleted

## 2015-02-28 MED ORDER — ALPRAZOLAM 0.5 MG PO TABS: 0.5000 mg | ORAL_TABLET | Freq: Two times a day (BID) | ORAL | Status: DC

## 2015-02-28 NOTE — Addendum Note (Signed)
Addended by: Jamelle Haring on: 02/28/2015 11:00 AM   Modules accepted: Orders

## 2015-02-28 NOTE — Telephone Encounter (Signed)
[facility] 

## 2015-02-28 NOTE — Telephone Encounter (Signed)
Has appt 04/30/14 with Sabra Heck. Call in if approved

## 2015-03-21 ENCOUNTER — Other Ambulatory Visit: Payer: Self-pay | Admitting: Family Medicine

## 2015-03-22 ENCOUNTER — Ambulatory Visit (INDEPENDENT_AMBULATORY_CARE_PROVIDER_SITE_OTHER): Payer: Medicare Other | Admitting: Family Medicine

## 2015-03-22 ENCOUNTER — Encounter: Payer: Self-pay | Admitting: Family Medicine

## 2015-03-22 VITALS — BP 134/89 | HR 82 | Temp 97.1°F | Ht 69.0 in | Wt 199.6 lb

## 2015-03-22 DIAGNOSIS — N400 Enlarged prostate without lower urinary tract symptoms: Secondary | ICD-10-CM

## 2015-03-22 DIAGNOSIS — F329 Major depressive disorder, single episode, unspecified: Secondary | ICD-10-CM

## 2015-03-22 DIAGNOSIS — F419 Anxiety disorder, unspecified: Secondary | ICD-10-CM | POA: Insufficient documentation

## 2015-03-22 DIAGNOSIS — I7300 Raynaud's syndrome without gangrene: Secondary | ICD-10-CM | POA: Diagnosis not present

## 2015-03-22 DIAGNOSIS — F418 Other specified anxiety disorders: Secondary | ICD-10-CM | POA: Diagnosis not present

## 2015-03-22 MED ORDER — CITALOPRAM HYDROBROMIDE 20 MG PO TABS
20.0000 mg | ORAL_TABLET | Freq: Every day | ORAL | Status: DC
Start: 2015-03-22 — End: 2015-03-27

## 2015-03-22 MED ORDER — TAMSULOSIN HCL 0.4 MG PO CAPS: 0.4000 mg | ORAL_CAPSULE | Freq: Every day | ORAL | Status: DC

## 2015-03-22 MED ORDER — NIFEDIPINE ER OSMOTIC RELEASE 30 MG PO TB24: 30.0000 mg | ORAL_TABLET | Freq: Every day | ORAL | Status: DC

## 2015-03-22 NOTE — Progress Notes (Signed)
BP 134/89 mmHg  Pulse 82  Temp(Src) 97.1 F (36.2 C) (Oral)  Ht 5\' 9"  (1.753 m)  Wt 199 lb 9.6 oz (90.538 kg)  BMI 29.46 kg/m2   Subjective:    Patient ID: [name], male    DOB: [birth year], 75 y.o.   MRN: [id number]  HPI: [name] is a 75 y.o. male presenting on 03/22/2015 for Decreased circulation in feet; Discuss catherization of self; and Depression   HPI Anxiety depression Patient comes in today because he has been having increased anxiety and depression. His wife passed away 5 years ago and since then he feels like he is just then slowly withdrawn himself doesn't want to do things and doesn't have any energy and doesn't sleep well at night. He has thoughts of just wanting to end and also he can be with her. 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 He feels like he is just this point where he is so low that he needs help. His wife is coming in today. He denies any actual plans for suicide just has thoughts of death and wanting to end on the suffering and loneliness. He does not have any family here close by.  Decreased circulation in feet Patient complains of having extremely cold toes and feet especially in this wintertime. He has never noticed that they change color but he does note that he has some pain associated with. It is especially worse when he is sitting with his feet elevated. He denies any symptoms of claudication on walking in his legs or his calves. This started about 2 months ago and has been more frequent as is gotten colder.  Urinary issues Patient has been having frequent urination and difficulty starting his stream and difficulty emptying his bladder completely. He has seen urology in the past for this but it's been quite a few years since he has seen them. They gave him a TURP and that helped his issues for a little  bit. He also did some self catheterizations at one point. He is not currently on any medications for this and was wondering if medications were self catheterization would be a good idea at this point.  Relevant past medical, surgical, family and social history reviewed and updated as indicated. Interim medical history since our last visit reviewed. Allergies and medications reviewed and updated.  Review of Systems  Constitutional: Negative for fever.  HENT: Negative for ear discharge and ear pain.   Eyes: Negative for discharge and visual disturbance.  Respiratory: Negative for shortness of breath and wheezing.   Cardiovascular: Negative for chest pain and leg swelling.  Gastrointestinal: Negative for abdominal pain, diarrhea and constipation.  Genitourinary: Positive for urgency, frequency and difficulty urinating. Negative for hematuria and decreased urine volume.  Musculoskeletal: Negative for back pain and gait problem.  Skin: Negative for color change and rash.  Neurological: Negative for syncope, light-headedness and headaches.  Psychiatric/Behavioral: Positive for suicidal ideas (but no actual plan), sleep disturbance, dysphoric mood, decreased concentration and agitation. Negative for hallucinations and self-injury. The patient is nervous/anxious.   All other systems reviewed and are negative.   Per HPI unless specifically indicated above     Medication List       This list is accurate as of: 03/22/15 12:26 PM.  Always use  your most recent med list.               ADVAIR HFA 115-21 MCG/ACT inhaler  Generic drug:  fluticasone-salmeterol  Inhale 2 puffs into the lungs 2 (two) times daily.     ALPRAZolam 0.5 MG tablet  Commonly known as:  XANAX  Take 1 tablet (0.5 mg total) by mouth 2 (two) times daily.     aspirin 81 MG tablet  Take 81 mg by mouth daily.     citalopram 20 MG tablet  Commonly known as:  CELEXA  Take 1 tablet (20 mg total) by mouth daily.      levothyroxine 88 MCG tablet  Commonly known as:  SYNTHROID, LEVOTHROID  TAKE 1 TABLET EVERY DAY     multivitamin tablet  Take 1 tablet by mouth daily.     NIFEdipine 30 MG 24 hr tablet  Commonly known as:  PROCARDIA-XL/ADALAT-CC/NIFEDICAL-XL  Take 1 tablet (30 mg total) by mouth daily.     omeprazole 20 MG capsule  Commonly known as:  PRILOSEC  Take 20 mg by mouth 2 (two) times daily before a meal.     simvastatin 20 MG tablet  Commonly known as:  ZOCOR  TAKE 1 TABLET (20 MG TOTAL) BY MOUTH AT BEDTIME.     tamsulosin 0.4 MG Caps capsule  Commonly known as:  FLOMAX  Take 1 capsule (0.4 mg total) by mouth daily.     Vitamin D 2000 units Caps  Take 1 capsule by mouth daily.           Objective:    BP 134/89 mmHg  Pulse 82  Temp(Src) 97.1 F (36.2 C) (Oral)  Ht 5\' 9"  (1.753 m)  Wt 199 lb 9.6 oz (90.538 kg)  BMI 29.46 kg/m2  Wt Readings from Last 3 Encounters:  03/22/15 199 lb 9.6 oz (90.538 kg)  10/23/14 192 lb (87.091 kg)  07/19/14 191 lb (86.637 kg)    Physical Exam  Constitutional: He is oriented to person, place, and time. He appears well-developed and well-nourished. No distress.  HENT:  Right Ear: External ear normal.  Left Ear: External ear normal.  Nose: Nose normal.  Mouth/Throat: Oropharynx is clear and moist. No oropharyngeal exudate.  Eyes: Conjunctivae and EOM are normal. Pupils are equal, round, and reactive to light. Right eye exhibits no discharge. No scleral icterus.  Neck: Neck supple. No thyromegaly present.  Cardiovascular: Normal rate, regular rhythm, S1 normal, S2 normal, normal heart sounds and intact distal pulses.  Exam reveals no decreased pulses.   No murmur heard. Pulses:      Dorsalis pedis pulses are 2+ on the right side, and 2+ on the left side.       Posterior tibial pulses are 2+ on the right side, and 2+ on the left side.  Pulmonary/Chest: Effort normal and breath sounds normal. No respiratory distress. He has no wheezes.    Abdominal: Normal appearance and bowel sounds are normal. There is no hepatosplenomegaly. There is no tenderness. There is no CVA tenderness.  Musculoskeletal: Normal range of motion. He exhibits no edema or tenderness.  Lymphadenopathy:    He has no cervical adenopathy.  Neurological: He is alert and oriented to person, place, and time. Coordination normal.  Skin: Skin is warm and dry. No rash noted. He is not diaphoretic. No erythema.  Psychiatric: His speech is normal and behavior is normal. Judgment normal. His mood appears anxious. His affect is labile. He does not express impulsivity or inappropriate  judgment. He exhibits a depressed mood. He expresses suicidal ideation. He expresses no suicidal plans.  Nursing note and vitals reviewed.   Results for orders placed or performed in visit on 10/23/14  TSH  Result Value Ref Range   TSH 2.410 0.450 - 4.500 uIU/mL      Assessment & Plan:   Problem List Items Addressed This Visit      Genitourinary   BPH (benign prostatic hyperplasia) - Primary    Has symptoms of BPH and will try Flomax, if not improved with this we'll send to urology      Relevant Medications   tamsulosin (FLOMAX) 0.4 MG CAPS capsule     Other   Anxiety and depression    Thoughts of ending his life to be with his wife but no plan, we'll start medication and gave numbers for crisis line and counseling      Relevant Medications   citalopram (CELEXA) 20 MG tablet   Raynaud's phenomenon (by history or observed)    We'll start calcium channel blocker and see if improves symptoms      Relevant Medications   NIFEdipine (PROCARDIA-XL/ADALAT-CC/NIFEDICAL-XL) 30 MG 24 hr tablet       Follow up plan: Return in about 4 weeks (around 04/19/2015), or if symptoms worsen or fail to improve, for has appt for march 2nd.  Counseling provided for all of the vaccine components No orders of the defined types were placed in this encounter.    Caryl Pina,  MD [facility] 03/22/2015, 12:26 PM

## 2015-03-25 ENCOUNTER — Telehealth: Payer: Self-pay | Admitting: Family Medicine

## 2015-03-25 NOTE — Assessment & Plan Note (Signed)
Thoughts of ending his life to be with his wife but no plan, we'll start medication and gave numbers for crisis line and counseling

## 2015-03-25 NOTE — Telephone Encounter (Signed)
Patient states that since starting procardia he has been fatigue and ankles swelling.  He also thinks flomax is causing a stuffy nose

## 2015-03-25 NOTE — Assessment & Plan Note (Signed)
Has symptoms of BPH and will try Flomax, if not improved with this we'll send to urology

## 2015-03-25 NOTE — Assessment & Plan Note (Signed)
We'll start calcium channel blocker and see if improves symptoms

## 2015-03-26 NOTE — Telephone Encounter (Signed)
It is possible for both of those medications to cause those symptoms commonly, if he will give it another week to see if those symptoms will go away then that would be best for trial. If it does not improve then stop them at that point. This also gives [place] a chance to see if the medications are giving any benefit before we go switching them. Caryl Pina, MD [facility] 03/26/2015, 2:25 PM

## 2015-03-26 NOTE — Telephone Encounter (Signed)
Patient is not willing to continue the Procardia. He is concerned that fatigue and ankle swelling were listed as a rare but serious side effect. He has not taken it today.  Advised that I was surprised that ankle swelling is listed as rare because I too feel that is a common side effect of a calcium channel blockers. Feels that Flomax made his nose more congested than normal. He took it for 3 or 4 days doesn't feel that it did any good anyway. He has taken these types of meds in the past without success.  Had a panic attack last night that he couldn't control with alprazolam. He had 2 tablets within 2 hours. Feels that Celexa may have been the cause of this.  He has not taken any of these medications today.   Do you want to try another medication? I believe he would like to try one new med at a time for now and see how he reacts.  He is more concerned about the depression and would like to try and treat that for now. Are there any antidepressants that aren't as likely to cause anxiety?

## 2015-03-26 NOTE — Telephone Encounter (Signed)
Patient will be in town on Wednesday afternoon and can pickup a new prescription then.

## 2015-03-27 DIAGNOSIS — F410 Panic disorder [episodic paroxysmal anxiety] without agoraphobia: Secondary | ICD-10-CM

## 2015-03-27 HISTORY — DX: Panic disorder (episodic paroxysmal anxiety): F41.0

## 2015-03-27 MED ORDER — BUPROPION HCL ER (XL) 150 MG PO TB24: 150.0000 mg | ORAL_TABLET | Freq: Every day | ORAL | Status: DC

## 2015-03-27 NOTE — Telephone Encounter (Signed)
Pt aware of recommendations. [facility]. Pt will come back to be seen in 4 weeks.

## 2015-03-27 NOTE — Telephone Encounter (Signed)
Okay I'm fine with stopping those other medications they were just trying to help him with some symptoms. If he wants to try a different antidepressant then we can send him Wellbutrin 150 mg extended release once a day, 24-hour version. Given 30 and one refill and I'll see him in a month. The likelihood that his antidepressant caused anxiety or panic attack not very high because we actually use these medications to treat anxiety and they're actually antianxiety medications. I know the list anxiety on one of the side effects but they do not cause it and we actually use these medications to treat anxiety. The likelihood is the panic attack just happened for other reasons but lets try the Wellbutrin and give it at least 2 or 3 weeks to get in your system. He will not likely have benefit from it or side effects from the medication until it is fully in your system. Also instruct him to avoid reading the side effect profiles because he is likely just going to get more anxious about it.

## 2015-04-23 ENCOUNTER — Ambulatory Visit: Payer: Medicare Other | Admitting: Family Medicine

## 2015-04-24 ENCOUNTER — Other Ambulatory Visit: Payer: Self-pay | Admitting: Neurology

## 2015-04-24 MED ORDER — OMEPRAZOLE 20 MG PO CPDR: 20.0000 mg | DELAYED_RELEASE_CAPSULE | Freq: Two times a day (BID) | ORAL | Status: DC

## 2015-04-25 ENCOUNTER — Encounter: Payer: Self-pay | Admitting: Family Medicine

## 2015-04-25 ENCOUNTER — Other Ambulatory Visit: Payer: Self-pay

## 2015-04-25 ENCOUNTER — Ambulatory Visit: Payer: Medicare Other | Admitting: Family Medicine

## 2015-04-25 ENCOUNTER — Ambulatory Visit (INDEPENDENT_AMBULATORY_CARE_PROVIDER_SITE_OTHER): Payer: Medicare Other | Admitting: Family Medicine

## 2015-04-25 VITALS — BP 181/113 | HR 68 | Temp 97.8°F | Ht 69.0 in | Wt 197.0 lb

## 2015-04-25 DIAGNOSIS — Z131 Encounter for screening for diabetes mellitus: Secondary | ICD-10-CM | POA: Diagnosis not present

## 2015-04-25 DIAGNOSIS — R7309 Other abnormal glucose: Secondary | ICD-10-CM

## 2015-04-25 DIAGNOSIS — R739 Hyperglycemia, unspecified: Secondary | ICD-10-CM

## 2015-04-25 DIAGNOSIS — F419 Anxiety disorder, unspecified: Secondary | ICD-10-CM

## 2015-04-25 DIAGNOSIS — E785 Hyperlipidemia, unspecified: Secondary | ICD-10-CM

## 2015-04-25 DIAGNOSIS — F32A Depression, unspecified: Secondary | ICD-10-CM

## 2015-04-25 DIAGNOSIS — N400 Enlarged prostate without lower urinary tract symptoms: Secondary | ICD-10-CM

## 2015-04-25 DIAGNOSIS — F418 Other specified anxiety disorders: Secondary | ICD-10-CM | POA: Diagnosis not present

## 2015-04-25 DIAGNOSIS — F329 Major depressive disorder, single episode, unspecified: Secondary | ICD-10-CM

## 2015-04-25 MED ORDER — CITALOPRAM HYDROBROMIDE 20 MG PO TABS: 20.0000 mg | ORAL_TABLET | Freq: Every day | ORAL | Status: DC

## 2015-04-25 MED ORDER — SIMVASTATIN 20 MG PO TABS: ORAL_TABLET | ORAL | Status: DC

## 2015-04-25 MED ORDER — ALPRAZOLAM 0.5 MG PO TABS: 0.5000 mg | ORAL_TABLET | Freq: Two times a day (BID) | ORAL | Status: DC

## 2015-04-25 NOTE — Progress Notes (Signed)
BP 181/113 mmHg  Pulse 68  Temp(Src) 97.8 F (36.6 C) (Oral)  Ht '5\' 9"'  (1.753 m)  Wt 197 lb (89.359 kg)  BMI 29.08 kg/m2   Subjective:    Patient ID: [name], male    DOB: 1940-06-27, 75 y.o.   MRN: [id number]  HPI: [name] is a 75 y.o. male presenting on 04/25/2015 for Labwork; Discuss medications; and Depression & anxiety   HPI Urinary dysfunction and BPH Patient comes in today with urinary dysfunction and BPH and having to urinate every 2 hours. He feels like he cannot empty his bladder completely. He has seen a urologist about 10 years ago but not frequently. He was given Flomax but was cautious about taking it because of the side effect profile. He has had cataracts but artery had them both replaced and has already new lenses in both eyes. We discussed side effect profile and discussed it with Tammy our pharmacist and she agrees that he is okay to go ahead and take it.  Hyperlipidemia Patient is on simvastatin 20 mg and denies any side effects from the medication and is due for recheck of his cholesterol today. He is fasting.  Anxiety depression Patient tried Wellbutrin for 4 days L needing more wheezing than when he was before and stopped it. He still feels like he is having more attacks from the medication about 4 weeks out. He is still having a lot of anxiety and depression. He still admits that if it wasn't for his 2 dogs and cat that he would probably taking and she himself in the head. An agreement he agrees not to go ahead and do that at this point. He doesn't want to continue to live feels like he doesn't have a lot of purpose since his wife died 5 years ago. His only purposely keeps him going are his pets. He is given suicide hotline number and also was told to call us if he has any issues. He has the Celexa which is the medication that we wanted to start after the Wellbutrin but has not started it yet. He plans to start it 1 week from now once he feels like the  Wellbutrin is completely out of his system. He does have issues with sleep and energy and does not eat as much as he used to when his wife is cooking and alive.  Relevant past medical, surgical, family and social history reviewed and updated as indicated. Interim medical history since our last visit reviewed. Allergies and medications reviewed and updated.  Review of Systems  Constitutional: Negative for fever and chills.  HENT: Negative for congestion, ear discharge and ear pain.   Eyes: Negative for discharge and visual disturbance.  Respiratory: Negative for cough, chest tightness, shortness of breath and wheezing.   Cardiovascular: Negative for chest pain and leg swelling.  Gastrointestinal: Negative for abdominal pain, diarrhea and constipation.  Endocrine: Negative for cold intolerance and heat intolerance.  Genitourinary: Positive for urgency, frequency, decreased urine volume and difficulty urinating. Negative for dysuria, hematuria, flank pain, scrotal swelling and penile pain.  Musculoskeletal: Negative for back pain and gait problem.  Skin: Negative for rash.  Neurological: Negative for dizziness, syncope, light-headedness and headaches.  Psychiatric/Behavioral: Positive for suicidal ideas (but promises not to take action while he currently has his pets alive), sleep disturbance, dysphoric mood and decreased concentration. Negative for self-injury. The patient is nervous/anxious.   All other systems reviewed and are negative.   Per HPI unless  specifically indicated above     Medication List       This list is accurate as of: 04/25/15 12:34 PM.  Always use your most recent med list.               ADVAIR HFA 115-21 MCG/ACT inhaler  Generic drug:  fluticasone-salmeterol  Inhale 2 puffs into the lungs 2 (two) times daily.     ALPRAZolam 0.5 MG tablet  Commonly known as:  XANAX  Take 1 tablet (0.5 mg total) by mouth 2 (two) times daily.     aspirin 81 MG tablet  Take 81  mg by mouth daily.     buPROPion 150 MG 24 hr tablet  Commonly known as:  WELLBUTRIN XL  Take 1 tablet (150 mg total) by mouth daily.     levothyroxine 88 MCG tablet  Commonly known as:  SYNTHROID, LEVOTHROID  TAKE 1 TABLET EVERY DAY     multivitamin tablet  Take 1 tablet by mouth daily.     NIFEdipine 30 MG 24 hr tablet  Commonly known as:  PROCARDIA-XL/ADALAT-CC/NIFEDICAL-XL  Take 1 tablet (30 mg total) by mouth daily.     omeprazole 20 MG capsule  Commonly known as:  PRILOSEC  Take 1 capsule (20 mg total) by mouth 2 (two) times daily before a meal.     simvastatin 20 MG tablet  Commonly known as:  ZOCOR  TAKE 1 TABLET (20 MG TOTAL) BY MOUTH AT BEDTIME.     tamsulosin 0.4 MG Caps capsule  Commonly known as:  FLOMAX  Take 1 capsule (0.4 mg total) by mouth daily.     Vitamin D 2000 units Caps  Take 1 capsule by mouth daily.           Objective:    BP 181/113 mmHg  Pulse 68  Temp(Src) 97.8 F (36.6 C) (Oral)  Ht '5\' 9"'  (1.753 m)  Wt 197 lb (89.359 kg)  BMI 29.08 kg/m2  Wt Readings from Last 3 Encounters:  04/25/15 197 lb (89.359 kg)  03/22/15 199 lb 9.6 oz (90.538 kg)  10/23/14 192 lb (87.091 kg)    Physical Exam  Constitutional: He is oriented to person, place, and time. He appears well-developed and well-nourished. No distress.  Eyes: Conjunctivae and EOM are normal. Pupils are equal, round, and reactive to light. Right eye exhibits no discharge. No scleral icterus.  Neck: Neck supple. No thyromegaly present.  Cardiovascular: Normal rate, regular rhythm, normal heart sounds and intact distal pulses.   No murmur heard. Pulmonary/Chest: Effort normal and breath sounds normal. No respiratory distress. He has no wheezes.  Abdominal: He exhibits no distension. There is no tenderness. There is no rebound.  Musculoskeletal: Normal range of motion. He exhibits no edema.  Lymphadenopathy:    He has no cervical adenopathy.  Neurological: He is alert and oriented to  person, place, and time. Coordination normal.  Skin: Skin is warm and dry. No rash noted. He is not diaphoretic.  Psychiatric: His speech is normal. Judgment normal. His mood appears anxious. His affect is angry and blunt. His affect is not inappropriate. He is agitated. He is not actively hallucinating and not combative. Thought content is not paranoid and not delusional. He does not express impulsivity or inappropriate judgment. He exhibits a depressed mood. He expresses suicidal ideation. He expresses suicidal plans (Promises not to take action and try new medication and will keep in communication and given hotline numbers., Gauged to not be to the point where he  would actually take action based on her questioning). He is attentive.  Vitals reviewed.   Results for orders placed or performed in visit on 10/23/14  TSH  Result Value Ref Range   TSH 2.410 0.450 - 4.500 uIU/mL      Assessment & Plan:   Problem List Items Addressed This Visit      Genitourinary   BPH (benign prostatic hyperplasia)   Relevant Orders   PSA, total and free (Completed)     Other   HLD (hyperlipidemia) - Primary   Relevant Orders   Lipid panel (Completed)   Anxiety and depression    Other Visit Diagnoses    Screening for diabetes mellitus (DM)        Relevant Orders    CMP14+EGFR (Completed)    Elevated blood sugar            Follow up plan: Return in about 4 weeks (around 05/23/2015), or if symptoms worsen or fail to improve, for recheck depression.  Counseling provided for all of the vaccine components Orders Placed This Encounter  Procedures  . CMP14+EGFR  . Lipid panel  . PSA, total and free    Caryl Pina, MD [facility] 04/25/2015, 12:34 PM

## 2015-04-25 NOTE — Telephone Encounter (Signed)
I am unable to determine which prescription needs to be refill

## 2015-04-25 NOTE — Telephone Encounter (Signed)
Last seen 03/22/15 Dr Dettinger  PCP Dr Sabra Heck  Last lipid 04/19/14

## 2015-04-26 LAB — CMP14+EGFR
ALBUMIN: 4.7 g/dL (ref 3.5–4.8)
ALT: 31 IU/L (ref 0–44)
AST: 31 IU/L (ref 0–40)
Albumin/Globulin Ratio: 1.7 (ref 1.1–2.5)
Alkaline Phosphatase: 78 IU/L (ref 39–117)
BUN / CREAT RATIO: 13 (ref 10–22)
BUN: 10 mg/dL (ref 8–27)
Bilirubin Total: 0.6 mg/dL (ref 0.0–1.2)
CALCIUM: 9.3 mg/dL (ref 8.6–10.2)
CHLORIDE: 101 mmol/L (ref 96–106)
CO2: 23 mmol/L (ref 18–29)
Creatinine, Ser: 0.75 mg/dL — ABNORMAL LOW (ref 0.76–1.27)
GFR calc non Af Amer: 91 mL/min/{1.73_m2} (ref >59)
GFR, EST AFRICAN AMERICAN: 105 mL/min/{1.73_m2} (ref >59)
GLUCOSE: 105 mg/dL — AB (ref 65–99)
Globulin, Total: 2.7 g/dL (ref 1.5–4.5)
POTASSIUM: 4.1 mmol/L (ref 3.5–5.2)
SODIUM: 141 mmol/L (ref 134–144)
TOTAL PROTEIN: 7.4 g/dL (ref 6.0–8.5)

## 2015-04-26 LAB — LIPID PANEL
CHOL/HDL RATIO: 3.4 ratio (ref 0.0–5.0)
Cholesterol, Total: 149 mg/dL (ref 100–199)
HDL: 44 mg/dL (ref >39)
LDL Calculated: 80 mg/dL (ref 0–99)
Triglycerides: 123 mg/dL (ref 0–149)
VLDL Cholesterol Cal: 25 mg/dL (ref 5–40)

## 2015-04-26 LAB — PSA, TOTAL AND FREE
PROSTATE SPECIFIC AG, SERUM: 1.7 ng/mL (ref 0.0–4.0)
PSA FREE: 0.25 ng/mL
PSA, Free Pct: 14.7 %

## 2015-04-29 ENCOUNTER — Other Ambulatory Visit: Payer: Self-pay | Admitting: *Deleted

## 2015-04-29 DIAGNOSIS — R7309 Other abnormal glucose: Secondary | ICD-10-CM | POA: Diagnosis not present

## 2015-04-29 DIAGNOSIS — R739 Hyperglycemia, unspecified: Secondary | ICD-10-CM

## 2015-04-29 LAB — BAYER DCA HB A1C WAIVED: HB A1C: 5.7 % (ref <7.0)

## 2015-05-15 ENCOUNTER — Telehealth: Payer: Self-pay | Admitting: Family Medicine

## 2015-05-15 NOTE — Telephone Encounter (Signed)
Spoke with patient and clarified situation.  Patient is currently taking buproprion XL 150mg  daily.   He has panic attacks for the first time in January after he started citalopram.  He actually reports that the panic attacks had decreased until yesterday when he has 2 "servere" panic attacks.  He took 1/2 tablet of xanax.  Upon further questioning he did report that one of his dogs which is his only companions has lymphoma and that the last 2 days has been rough.  I encouraged patient to continue Wellbutrin XL 150mg  daily.  He is to call me if he has another episode.

## 2015-05-17 ENCOUNTER — Ambulatory Visit (INDEPENDENT_AMBULATORY_CARE_PROVIDER_SITE_OTHER): Payer: Medicare Other | Admitting: Family Medicine

## 2015-05-17 ENCOUNTER — Encounter: Payer: Self-pay | Admitting: Family Medicine

## 2015-05-17 VITALS — BP 159/102 | HR 70 | Temp 97.0°F | Ht 69.0 in | Wt 195.8 lb

## 2015-05-17 DIAGNOSIS — F418 Other specified anxiety disorders: Secondary | ICD-10-CM

## 2015-05-17 DIAGNOSIS — F419 Anxiety disorder, unspecified: Principal | ICD-10-CM

## 2015-05-17 DIAGNOSIS — F329 Major depressive disorder, single episode, unspecified: Secondary | ICD-10-CM

## 2015-05-17 MED ORDER — ALPRAZOLAM 0.5 MG PO TABS: 0.5000 mg | ORAL_TABLET | Freq: Three times a day (TID) | ORAL | Status: DC | PRN

## 2015-05-17 NOTE — Progress Notes (Signed)
BP 159/102 mmHg  Pulse 70  Temp(Src) 97 F (36.1 C) (Oral)  Ht 5\' 9"  (1.753 m)  Wt 195 lb 12.8 oz (88.814 kg)  BMI 28.90 kg/m2   Subjective:    Patient ID: [name], male    DOB: 1940-11-13, 75 y.o.   MRN: [id number]  HPI: [name] is a 75 y.o. male presenting on 05/17/2015 for Panic Attack and Depression   HPI Anxiety and depression and panic attacks  Patient comes in today for recheck on anxiety and depression and panic attacks. He says he has been on the Wellbutrin for 6 days and has been having increased panic attacks as well as on them. He blames the medication like to come off of it. We instructed on how to taper down by taking it every other day. Discussed with the patient the possibility of treating his depression with other medications that could help versus going to psychiatry for a specialist opinion and he prefers to go to psychiatry for specialist opinion. He denies any plan right now to kill himself but he does say that he has suicidal ideations and that if he didn't have his 2 dogs that he is taking care of he would probably should himself in the head.  Relevant past medical, surgical, family and social history reviewed and updated as indicated. Interim medical history since our last visit reviewed. Allergies and medications reviewed and updated.  Review of Systems  Constitutional: Negative for fever and chills.  HENT: Negative for congestion, ear discharge and ear pain.   Eyes: Negative for discharge and visual disturbance.  Respiratory: Negative for cough, chest tightness, shortness of breath and wheezing.   Cardiovascular: Negative for chest pain and leg swelling.  Gastrointestinal: Negative for abdominal pain, diarrhea and constipation.  Endocrine: Negative for cold intolerance and heat intolerance.  Genitourinary: Positive for urgency and frequency. Negative for dysuria, hematuria, flank pain, decreased urine volume, scrotal swelling, difficulty  urinating and penile pain.  Musculoskeletal: Negative for back pain and gait problem.  Skin: Negative for rash.  Neurological: Negative for dizziness, syncope, light-headedness and headaches.  Psychiatric/Behavioral: Positive for suicidal ideas (but promises not to take action while he currently has his pets alive), sleep disturbance, dysphoric mood and decreased concentration. Negative for self-injury. The patient is nervous/anxious.   All other systems reviewed and are negative.  Per HPI unless specifically indicated above    Medication List       This list is accurate as of: 05/17/15 12:14 PM.  Always use your most recent med list.               ADVAIR HFA 115-21 MCG/ACT inhaler  Generic drug:  fluticasone-salmeterol  Inhale 2 puffs into the lungs 2 (two) times daily.     ALPRAZolam 0.5 MG tablet  Commonly known as:  XANAX  Take 1 tablet (0.5 mg total) by mouth 3 (three) times daily as needed for anxiety.     aspirin 81 MG tablet  Take 81 mg by mouth daily.     buPROPion 150 MG 24 hr tablet  Commonly known as:  WELLBUTRIN XL  Take 150 mg by mouth daily.     levothyroxine 88 MCG tablet  Commonly known as:  SYNTHROID, LEVOTHROID  TAKE 1 TABLET EVERY DAY     multivitamin tablet  Take 1 tablet by mouth daily.     omeprazole 20 MG capsule  Commonly known as:  PRILOSEC  Take 1 capsule (20 mg total) by  mouth 2 (two) times daily before a meal.     simvastatin 20 MG tablet  Commonly known as:  ZOCOR  TAKE 1 TABLET (20 MG TOTAL) BY MOUTH AT BEDTIME.     tamsulosin 0.4 MG Caps capsule  Commonly known as:  FLOMAX  Take 1 capsule (0.4 mg total) by mouth daily.     Vitamin D 2000 units Caps  Take 1 capsule by mouth daily.          Objective:    BP 159/102 mmHg  Pulse 70  Temp(Src) 97 F (36.1 C) (Oral)  Ht 5\' 9"  (1.753 m)  Wt 195 lb 12.8 oz (88.814 kg)  BMI 28.90 kg/m2  Wt Readings from Last 3 Encounters:  05/17/15 195 lb 12.8 oz (88.814 kg)  04/25/15 197 lb  (89.359 kg)  03/22/15 199 lb 9.6 oz (90.538 kg)    Physical Exam  Constitutional: He is oriented to person, place, and time. He appears well-developed and well-nourished. No distress.  Eyes: Conjunctivae and EOM are normal. Pupils are equal, round, and reactive to light. Right eye exhibits no discharge. No scleral icterus.  Neck: Neck supple. No thyromegaly present.  Cardiovascular: Normal rate, regular rhythm, normal heart sounds and intact distal pulses.   No murmur heard. Pulmonary/Chest: Effort normal and breath sounds normal. No respiratory distress. He has no wheezes.  Abdominal: He exhibits no distension. There is no tenderness. There is no rebound.  Musculoskeletal: Normal range of motion. He exhibits no edema.  Lymphadenopathy:    He has no cervical adenopathy.  Neurological: He is alert and oriented to person, place, and time. Coordination normal.  Skin: Skin is warm and dry. No rash noted. He is not diaphoretic.  Psychiatric: His speech is normal. Judgment normal. His mood appears anxious. His affect is angry and blunt. His affect is not inappropriate. He is agitated. He is not actively hallucinating and not combative. Thought content is not paranoid and not delusional. He does not express impulsivity or inappropriate judgment. He exhibits a depressed mood. He expresses suicidal ideation. He expresses suicidal plans (Promises not to take action and try new medication and will keep in communication and given hotline numbers., Gauged to not be to the point where he would actually take action based on her questioning). He is attentive.  Vitals reviewed.     Assessment & Plan:       Problem List Items Addressed This Visit      Other   Anxiety and depression - Primary   Relevant Orders   Ambulatory referral to Psychiatry      Follow up plan: Return in about 3 weeks (around 06/07/2015), or if symptoms worsen or fail to improve, for f/u anxiety.  Counseling provided for all of  the vaccine components Orders Placed This Encounter  Procedures  . Ambulatory referral to Psychiatry    Caryl Pina, MD [facility] 05/17/2015, 12:14 PM

## 2015-05-17 NOTE — Patient Instructions (Signed)
Thank you for allowing us to care for you today. We strive to provide exceptional quality and compassionate care. Please let us know how we are doing and how we can help serve you better by filling out the survey that you receive from [employer].     

## 2015-05-20 ENCOUNTER — Telehealth: Payer: Self-pay | Admitting: Family Medicine

## 2015-05-20 DIAGNOSIS — Z5320 Procedure and treatment not carried out because of patient's decision for unspecified reasons: Secondary | ICD-10-CM | POA: Diagnosis not present

## 2015-05-20 DIAGNOSIS — T7840XA Allergy, unspecified, initial encounter: Secondary | ICD-10-CM | POA: Diagnosis not present

## 2015-05-20 DIAGNOSIS — F419 Anxiety disorder, unspecified: Secondary | ICD-10-CM | POA: Diagnosis not present

## 2015-05-20 NOTE — Telephone Encounter (Addendum)
Patient states that he is still having panic attacks. He states that he took a 1/2 tablet of alprazolam at 5:00am and  then another 1/2 at 5:30am. Patient states that he is suicidal and if he doesn't get help he could kill himself. He states that he is ready to put a gun to his head. Patient states that he has several guns in the house. He wants to know how much alprazolam he can take. Per Dr. Warrick Parisian call the [facility] Department and have them transport him for evaluation. At 10:00 the [facility] Department was called and they transferred me to the dispatch department and at 10:04 a officer and EMS was being sent to patients home. Dr. Warrick Parisian came in and spoke with patient on the phone and advised him that we did call 911 and the [place] police and EMS department is on their way. We did keep patient on the phone while we were waiting on the police and EMS to arrive. Sheriff's department arrived at patients home at 10:18. I was on the phone with patient for 35 minutes.

## 2015-05-23 ENCOUNTER — Encounter: Payer: Self-pay | Admitting: Family Medicine

## 2015-05-23 ENCOUNTER — Ambulatory Visit (INDEPENDENT_AMBULATORY_CARE_PROVIDER_SITE_OTHER): Payer: Medicare Other | Admitting: Family Medicine

## 2015-05-23 VITALS — BP 146/98 | HR 75 | Ht 69.0 in | Wt 193.0 lb

## 2015-05-23 DIAGNOSIS — F419 Anxiety disorder, unspecified: Principal | ICD-10-CM

## 2015-05-23 DIAGNOSIS — F418 Other specified anxiety disorders: Secondary | ICD-10-CM

## 2015-05-23 DIAGNOSIS — F329 Major depressive disorder, single episode, unspecified: Secondary | ICD-10-CM

## 2015-05-23 NOTE — Progress Notes (Signed)
Subjective:    Patient ID: [name], male    DOB: [birth year], 75 y.o.   MRN: [id number]  HPI 75 year old gentleman with apparent panic disorder. He feels this was precipitated by use of antidepressants that dates back to January. He was given citalopram take it for 3 days before the panic attack started. Was then switched to Wellbutrin and panic have continued. He is off all antidepressants now and takes alprazolam as needed for the panic disorder. I explained that  Disorder may be related the antidepressant depressants but there may be other issues causing panic disorders well. There has been some question about depression. At one point he made a statement of wanting to shoot himself but because of the panic disorder and this precipitated a visit from the [facility] office and ER visits none of which because of backup in weight. Has been scheduled to see psychiatry.  Patient Active Problem List   Diagnosis Date Noted  . Anxiety and depression 03/22/2015  . Raynaud's phenomenon (by history or observed) 03/22/2015  . LPRD (laryngopharyngeal reflux disease) 01/29/2015  . Rhinitis medicamentosa 01/29/2015  . Moderate persistent asthma 01/29/2015  . Incidental lung nodule, greater than or equal to 35mm 07/19/2014  . History of smoking for 1-2 years 07/19/2014  . Arthritis   . Cervical radiculitis 12/20/2012  . Degenerative arthritis of cervical spine 11/15/2012  . OAB (overactive bladder) 07/22/2012  . BPH (benign prostatic hyperplasia) 07/22/2012  . Hypothyroidism 07/22/2012  . HLD (hyperlipidemia) 07/22/2012  . Allergic rhinitis due to pollen 05/06/2010  . IRRITABLE BOWEL SYNDROME 10/05/2007   Outpatient Encounter Prescriptions as of 05/23/2015  Medication Sig  . ADVAIR HFA 115-21 MCG/ACT inhaler Inhale 2 puffs into the lungs 2 (two) times daily.  Marland Kitchen ALPRAZolam (XANAX) 0.5 MG tablet Take 1 tablet (0.5 mg total) by mouth 3 (three) times daily as needed for anxiety.  Marland Kitchen aspirin 81 MG  tablet Take 81 mg by mouth daily.  . Cholecalciferol (VITAMIN D) 2000 UNITS CAPS Take 1 capsule by mouth daily.    Marland Kitchen levothyroxine (SYNTHROID, LEVOTHROID) 88 MCG tablet TAKE 1 TABLET EVERY DAY  . Multiple Vitamin (MULTIVITAMIN) tablet Take 1 tablet by mouth daily.    Marland Kitchen omeprazole (PRILOSEC) 20 MG capsule Take 1 capsule (20 mg total) by mouth 2 (two) times daily before a meal.  . simvastatin (ZOCOR) 20 MG tablet TAKE 1 TABLET (20 MG TOTAL) BY MOUTH AT BEDTIME.  . tamsulosin (FLOMAX) 0.4 MG CAPS capsule Take 1 capsule (0.4 mg total) by mouth daily.  . [DISCONTINUED] buPROPion (WELLBUTRIN XL) 150 MG 24 hr tablet Take 150 mg by mouth daily.   No facility-administered encounter medications on file as of 05/23/2015.      Review of Systems  Psychiatric/Behavioral: Positive for suicidal ideas. The patient is nervous/anxious.        Objective:   Physical Exam  Constitutional: He is oriented to person, place, and time. He appears well-developed and well-nourished.  HENT:  Head: Normocephalic.  Right Ear: External ear normal.  Left Ear: External ear normal.  Nose: Nose normal.  Mouth/Throat: Oropharynx is clear and moist.  Eyes: Conjunctivae and EOM are normal. Pupils are equal, round, and reactive to light.  Neck: Normal range of motion. Neck supple.  Cardiovascular: Normal rate, regular rhythm, normal heart sounds and intact distal pulses.   Pulmonary/Chest: Effort normal and breath sounds normal.  Abdominal: Soft. Bowel sounds are normal.  Musculoskeletal: Normal range of motion.  Neurological: He is alert and oriented  to person, place, and time.  Skin: Skin is warm and dry.  Psychiatric: He has a normal mood and affect. His behavior is normal. Judgment and thought content normal.          Assessment & Plan:  1. Anxiety and depression  With multiple attacks and prn use of alprazolam, need different approach.  Will try med scheduled at .5 mg tid and titrate as needed  Wardell Honour MD

## 2015-05-23 NOTE — Progress Notes (Signed)
Tc to [facility] pharmacy sig for Alprazolam has increased to TID. Refill from 3/24 for #90 called in to put on hold.

## 2015-05-27 ENCOUNTER — Telehealth: Payer: Self-pay | Admitting: Pharmacist

## 2015-05-27 NOTE — Telephone Encounter (Signed)
Called patient to check on him.  He states that he is doing much better with recent medication changes.  We discussed some social activities in the [facility] such as Meals with Friend.  He states that he is planning to move back to [place].  But will consider opportunities discussed.

## 2015-05-28 ENCOUNTER — Ambulatory Visit: Payer: Medicare Other | Admitting: Family Medicine

## 2015-06-06 ENCOUNTER — Encounter: Payer: Self-pay | Admitting: Family Medicine

## 2015-06-06 ENCOUNTER — Ambulatory Visit (INDEPENDENT_AMBULATORY_CARE_PROVIDER_SITE_OTHER): Payer: Medicare Other | Admitting: Family Medicine

## 2015-06-06 VITALS — BP 128/79 | HR 73 | Temp 97.9°F | Ht 69.0 in | Wt 194.6 lb

## 2015-06-06 DIAGNOSIS — F418 Other specified anxiety disorders: Secondary | ICD-10-CM

## 2015-06-06 DIAGNOSIS — F329 Major depressive disorder, single episode, unspecified: Secondary | ICD-10-CM

## 2015-06-06 DIAGNOSIS — F419 Anxiety disorder, unspecified: Principal | ICD-10-CM

## 2015-06-06 NOTE — Progress Notes (Signed)
Subjective:    Patient ID: [name], male    DOB: 1940-02-25, 75 y.o.   MRN: [id number]  HPI 75 year old gentleman who is being followed for depression anxiety and panic disorder. He appears depressed. He does have an appointment with psychiatry in June. 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 He is very lonely after his wife of 28 years died. I think he is feeling a void which had been filled by his rescue animals but now he needs more companionship. He discussed dating services in online services to try to find someone. He formerly was searched but I think not currently since most of the people in his church were not supportive to him when the wife died.  Since being placed on alprazolam on scheduled 3 times a day dosing his panic attacks have markedly improved.  Patient Active Problem List   Diagnosis Date Noted  . Anxiety and depression 03/22/2015  . Raynaud's phenomenon (by history or observed) 03/22/2015  . LPRD (laryngopharyngeal reflux disease) 01/29/2015  . Rhinitis medicamentosa 01/29/2015  . Moderate persistent asthma 01/29/2015  . Incidental lung nodule, greater than or equal to 56mm 07/19/2014  . History of smoking for 1-2 years 07/19/2014  . Arthritis   . Cervical radiculitis 12/20/2012  . Degenerative arthritis of cervical spine 11/15/2012  . OAB (overactive bladder) 07/22/2012  . BPH (benign prostatic hyperplasia) 07/22/2012  . Hypothyroidism 07/22/2012  . HLD (hyperlipidemia) 07/22/2012  . Allergic rhinitis due to pollen 05/06/2010  . IRRITABLE BOWEL SYNDROME 10/05/2007   Outpatient Encounter Prescriptions as of 06/06/2015  Medication Sig  . ADVAIR HFA 115-21 MCG/ACT inhaler Inhale 2 puffs into the lungs 2 (two) times daily.  Marland Kitchen ALPRAZolam (XANAX) 0.5 MG tablet Take 1 tablet (0.5 mg total) by mouth 3 (three) times daily as needed for anxiety.  Marland Kitchen aspirin 81 MG tablet Take 81 mg by mouth daily.  . Cholecalciferol  (VITAMIN D) 2000 UNITS CAPS Take 1 capsule by mouth daily.    Marland Kitchen levothyroxine (SYNTHROID, LEVOTHROID) 88 MCG tablet TAKE 1 TABLET EVERY DAY  . Multiple Vitamin (MULTIVITAMIN) tablet Take 1 tablet by mouth daily.    Marland Kitchen omeprazole (PRILOSEC) 20 MG capsule Take 1 capsule (20 mg total) by mouth 2 (two) times daily before a meal.  . simvastatin (ZOCOR) 20 MG tablet TAKE 1 TABLET (20 MG TOTAL) BY MOUTH AT BEDTIME.  . tamsulosin (FLOMAX) 0.4 MG CAPS capsule Take 1 capsule (0.4 mg total) by mouth daily.   No facility-administered encounter medications on file as of 06/06/2015.      Review of Systems  Respiratory: Negative.   Cardiovascular: Negative.   Psychiatric/Behavioral: Positive for sleep disturbance. The patient is nervous/anxious.        Objective:   Physical Exam  Constitutional: He is oriented to person, place, and time. He appears well-developed and well-nourished.  Neurological: He is alert and oriented to person, place, and time.  Psychiatric: His behavior is normal. Thought content normal.  Poor eye contact and patient does express desire to not live except for his rescue animals. When they die I'm not sure what effect that will have on him.          Assessment & Plan:  1. Anxiety and depression Alprazolam scheduled as before. He will not consider starting another antidepressant since he has decided that all a  Wardell Honour MDggravate anxiety and panic attack. I told him this was not  necessarily the case but he is very stubborn in this regard.

## 2015-06-10 ENCOUNTER — Telehealth: Payer: Self-pay | Admitting: Family Medicine

## 2015-06-10 NOTE — Telephone Encounter (Signed)
Patient aware that we do not see where patient was called by our office.

## 2015-06-21 ENCOUNTER — Other Ambulatory Visit: Payer: Self-pay | Admitting: Allergy and Immunology

## 2015-06-21 MED ORDER — ADVAIR HFA 115-21 MCG/ACT IN AERO: 2.0000 | INHALATION_SPRAY | Freq: Two times a day (BID) | RESPIRATORY_TRACT | Status: DC

## 2015-06-21 NOTE — Telephone Encounter (Signed)
Informed patient that script was sent to the pharmacy. 

## 2015-06-21 NOTE — Telephone Encounter (Signed)
Pt called and said that he change pharmacy and need to have advair called into [facility] in [facility]

## 2015-07-01 ENCOUNTER — Telehealth: Payer: Self-pay | Admitting: Family Medicine

## 2015-07-02 MED ORDER — ALPRAZOLAM 0.5 MG PO TABS: 0.5000 mg | ORAL_TABLET | Freq: Three times a day (TID) | ORAL | Status: DC | PRN

## 2015-07-02 NOTE — Telephone Encounter (Signed)
Last filled 06/04/15, last seen by Dettinger on 05/17/15. Route to pool. Call in at [facility]

## 2015-07-02 NOTE — Telephone Encounter (Signed)
Rx called in to pharmacy. 

## 2015-07-04 MED ORDER — ALPRAZOLAM 0.5 MG PO TABS: 0.5000 mg | ORAL_TABLET | Freq: Three times a day (TID) | ORAL | Status: DC | PRN

## 2015-07-05 ENCOUNTER — Ambulatory Visit (INDEPENDENT_AMBULATORY_CARE_PROVIDER_SITE_OTHER): Payer: Medicare Other | Admitting: Family Medicine

## 2015-07-05 ENCOUNTER — Encounter: Payer: Self-pay | Admitting: Family Medicine

## 2015-07-05 VITALS — BP 124/87 | HR 79 | Temp 97.5°F | Ht 69.0 in | Wt 196.0 lb

## 2015-07-05 DIAGNOSIS — F329 Major depressive disorder, single episode, unspecified: Secondary | ICD-10-CM

## 2015-07-05 DIAGNOSIS — F418 Other specified anxiety disorders: Secondary | ICD-10-CM | POA: Diagnosis not present

## 2015-07-05 DIAGNOSIS — F419 Anxiety disorder, unspecified: Principal | ICD-10-CM

## 2015-07-05 MED ORDER — DOXYCYCLINE HYCLATE 100 MG PO TABS: ORAL_TABLET | ORAL | Status: DC

## 2015-07-05 NOTE — Progress Notes (Signed)
Subjective:    Patient ID: [name], male    DOB: [birth year], 75 y.o.   MRN: [id number]  HPI Patient here today for tick bite and to discuss anxiety medication.  He has been doing pretty well on Xanax 0.5 mg 3 times a day for the past few mornings he has awoken with suicidal ideations and thoughts. Not really understanding what's going on here. I do think he has some underlying depression but he is opposed to trying another antidepressant because he thinks that brought on the panic attack. Again he tells me the only reason he is not "at peace" now is that he has 3 dogs whom he loves and lives only for them.    Patient Active Problem List   Diagnosis Date Noted  . Anxiety and depression 03/22/2015  . Raynaud's phenomenon (by history or observed) 03/22/2015  . LPRD (laryngopharyngeal reflux disease) 01/29/2015  . Rhinitis medicamentosa 01/29/2015  . Moderate persistent asthma 01/29/2015  . Incidental lung nodule, greater than or equal to 59mm 07/19/2014  . History of smoking for 1-2 years 07/19/2014  . Arthritis   . Cervical radiculitis 12/20/2012  . Degenerative arthritis of cervical spine 11/15/2012  . OAB (overactive bladder) 07/22/2012  . BPH (benign prostatic hyperplasia) 07/22/2012  . Hypothyroidism 07/22/2012  . HLD (hyperlipidemia) 07/22/2012  . Allergic rhinitis due to pollen 05/06/2010  . IRRITABLE BOWEL SYNDROME 10/05/2007   Outpatient Encounter Prescriptions as of 07/05/2015  Medication Sig  . ADVAIR HFA 115-21 MCG/ACT inhaler Inhale 2 puffs into the lungs 2 (two) times daily.  Marland Kitchen ALPRAZolam (XANAX) 0.5 MG tablet Take 1 tablet (0.5 mg total) by mouth 3 (three) times daily as needed for anxiety.  Marland Kitchen aspirin 81 MG tablet Take 81 mg by mouth daily.  . Cholecalciferol (VITAMIN D) 2000 UNITS CAPS Take 1 capsule by mouth daily.    Marland Kitchen levothyroxine (SYNTHROID, LEVOTHROID) 88 MCG tablet TAKE 1 TABLET EVERY DAY  . Multiple Vitamin (MULTIVITAMIN) tablet Take 1 tablet by mouth  daily.    Marland Kitchen omeprazole (PRILOSEC) 20 MG capsule Take 1 capsule (20 mg total) by mouth 2 (two) times daily before a meal.  . simvastatin (ZOCOR) 20 MG tablet TAKE 1 TABLET (20 MG TOTAL) BY MOUTH AT BEDTIME.  . tamsulosin (FLOMAX) 0.4 MG CAPS capsule Take 1 capsule (0.4 mg total) by mouth daily.   No facility-administered encounter medications on file as of 07/05/2015.      Review of Systems  Constitutional: Negative.   HENT: Negative.   Eyes: Negative.   Respiratory: Negative.   Cardiovascular: Negative.   Gastrointestinal: Negative.   Endocrine: Negative.   Genitourinary: Negative.   Musculoskeletal: Negative.   Skin: Negative.        Tick bite  Allergic/Immunologic: Negative.   Neurological: Negative.   Hematological: Negative.   Psychiatric/Behavioral: The patient is nervous/anxious (panic attacks).        Objective:   Physical Exam  Constitutional: He is oriented to person, place, and time. He appears well-developed and well-nourished.  Cardiovascular: Regular rhythm.   Pulmonary/Chest: Effort normal and breath sounds normal.  Neurological: He is alert and oriented to person, place, and time.  Skin:  Area on left upper posterior shoulder and indurated. Explored with sharp instrument no tick foreign parts found Band-Aid applied with Polysporin  Psychiatric:  His thought content is not normal. He does have an appointment with psychiatry on June 21 hopefully, we can carry him along until then with Xanax and get some more  professional help with his mental health issues.   BP 124/87 mmHg  Pulse 79  Temp(Src) 97.5 F (36.4 C) (Oral)  Ht 5\' 9"  (1.753 m)  Wt 196 lb (88.905 kg)  BMI 28.93 kg/m2        Assessment & Plan:   1. Anxiety and depression Increase Xanax to 1 mg in the morning since his symptoms seem to be worse in the morning. Given permission to take another half pill on a when necessary basis at nighttime or if symptoms occur at other times.  Wardell Honour MD

## 2015-07-08 ENCOUNTER — Telehealth (HOSPITAL_COMMUNITY): Payer: Self-pay

## 2015-07-08 ENCOUNTER — Telehealth: Payer: Self-pay | Admitting: Family Medicine

## 2015-07-08 ENCOUNTER — Ambulatory Visit: Payer: Medicare Other | Admitting: Family Medicine

## 2015-07-10 ENCOUNTER — Ambulatory Visit (INDEPENDENT_AMBULATORY_CARE_PROVIDER_SITE_OTHER): Payer: Medicare Other | Admitting: Family Medicine

## 2015-07-10 ENCOUNTER — Encounter: Payer: Self-pay | Admitting: Family Medicine

## 2015-07-10 VITALS — BP 127/85 | HR 85 | Temp 97.0°F | Ht 69.0 in | Wt 193.0 lb

## 2015-07-10 DIAGNOSIS — F418 Other specified anxiety disorders: Secondary | ICD-10-CM

## 2015-07-10 DIAGNOSIS — F419 Anxiety disorder, unspecified: Principal | ICD-10-CM

## 2015-07-10 DIAGNOSIS — F329 Major depressive disorder, single episode, unspecified: Secondary | ICD-10-CM

## 2015-07-10 MED ORDER — ALPRAZOLAM 0.5 MG PO TABS: 1.0000 mg | ORAL_TABLET | Freq: Three times a day (TID) | ORAL | Status: DC | PRN

## 2015-07-10 NOTE — Progress Notes (Signed)
Subjective:    Patient ID: [name], male    DOB: 19-Feb-1941, 75 y.o.   MRN: [id number]  HPI 75 year old man with anxiety and depression. Seems like he is coming almost weekly now as we try to adjust his alprazolam. Alprazolam was working for a while but there is increased need with breakthrough anxiety. He is not willing to try a different antidepressant but that fortunately has an appointment with psychiatry moved up from June to the end of this month now hopefully they will be able to help him. He has increased his alprazolam to 5+ tablets a day and is to in the morning to afternoon one at bedtime with 1 in the nighttime as needed that seems to be controlling most of his symptoms that he still appears very depressed with little reason to live except for his dogs.  Patient Active Problem List   Diagnosis Date Noted  . Anxiety and depression 03/22/2015  . Raynaud's phenomenon (by history or observed) 03/22/2015  . LPRD (laryngopharyngeal reflux disease) 01/29/2015  . Rhinitis medicamentosa 01/29/2015  . Moderate persistent asthma 01/29/2015  . Incidental lung nodule, greater than or equal to 22mm 07/19/2014  . History of smoking for 1-2 years 07/19/2014  . Arthritis   . Cervical radiculitis 12/20/2012  . Degenerative arthritis of cervical spine 11/15/2012  . OAB (overactive bladder) 07/22/2012  . BPH (benign prostatic hyperplasia) 07/22/2012  . Hypothyroidism 07/22/2012  . HLD (hyperlipidemia) 07/22/2012  . Allergic rhinitis due to pollen 05/06/2010  . IRRITABLE BOWEL SYNDROME 10/05/2007   Outpatient Encounter Prescriptions as of 07/10/2015  Medication Sig  . ADVAIR HFA 115-21 MCG/ACT inhaler Inhale 2 puffs into the lungs 2 (two) times daily.  Marland Kitchen ALPRAZolam (XANAX) 0.5 MG tablet Take 1 tablet (0.5 mg total) by mouth 3 (three) times daily as needed for anxiety.  Marland Kitchen aspirin 81 MG tablet Take 81 mg by mouth daily.  . Budesonide (RHINOCORT AQUA NA) Place 1 puff into the nose. Three  times a week  . Cholecalciferol (VITAMIN D) 2000 UNITS CAPS Take 1 capsule by mouth daily.    Marland Kitchen levothyroxine (SYNTHROID, LEVOTHROID) 88 MCG tablet TAKE 1 TABLET EVERY DAY  . Multiple Vitamin (MULTIVITAMIN) tablet Take 1 tablet by mouth daily.    Marland Kitchen omeprazole (PRILOSEC) 20 MG capsule Take 1 capsule (20 mg total) by mouth 2 (two) times daily before a meal.  . simvastatin (ZOCOR) 20 MG tablet TAKE 1 TABLET (20 MG TOTAL) BY MOUTH AT BEDTIME.  . tamsulosin (FLOMAX) 0.4 MG CAPS capsule Take 1 capsule (0.4 mg total) by mouth daily.  . [DISCONTINUED] doxycycline (VIBRA-TABS) 100 MG tablet Take 2 tablets at once   No facility-administered encounter medications on file as of 07/10/2015.      Review of Systems  HENT: Negative.   Respiratory: Negative.   Cardiovascular: Negative.   Gastrointestinal: Negative.   Psychiatric/Behavioral: Positive for sleep disturbance. The patient is nervous/anxious.        Objective:   Physical Exam  Constitutional: He appears well-developed.  Psychiatric:  I think patient is very depressed there is poor eye contact there is perseveration. Thought processes are somewhat disordered. I think he is basically intelligent but he needs someone to talk to.          Assessment & Plan:  1. Anxiety and depression Will increase alprazolam to 1 mg morning and afternoon with half milligram at night and another half as needed if he wakes up at night. Hope to stay on this  until seen by psychiatry and let them take over medicine management  Wardell Honour MD

## 2015-07-18 ENCOUNTER — Ambulatory Visit (INDEPENDENT_AMBULATORY_CARE_PROVIDER_SITE_OTHER): Payer: Medicare Other | Admitting: Family Medicine

## 2015-07-18 ENCOUNTER — Encounter: Payer: Self-pay | Admitting: Family Medicine

## 2015-07-18 VITALS — BP 139/86 | HR 69 | Temp 97.9°F | Ht 69.0 in | Wt 193.8 lb

## 2015-07-18 DIAGNOSIS — F418 Other specified anxiety disorders: Secondary | ICD-10-CM

## 2015-07-18 DIAGNOSIS — F329 Major depressive disorder, single episode, unspecified: Secondary | ICD-10-CM

## 2015-07-18 DIAGNOSIS — F419 Anxiety disorder, unspecified: Secondary | ICD-10-CM

## 2015-07-18 DIAGNOSIS — F32A Depression, unspecified: Secondary | ICD-10-CM

## 2015-07-18 DIAGNOSIS — N400 Enlarged prostate without lower urinary tract symptoms: Secondary | ICD-10-CM | POA: Diagnosis not present

## 2015-07-18 MED ORDER — TAMSULOSIN HCL 0.4 MG PO CAPS: 0.8000 mg | ORAL_CAPSULE | Freq: Every day | ORAL | Status: DC

## 2015-07-18 NOTE — Progress Notes (Signed)
Subjective:    Patient ID: [name], male    DOB: 1940-10-03, 75 y.o.   MRN: [id number]  HPI 75 year old gentleman here to what has become almost weekly visits as he details and clavicles his panic disorder and anxiety. The alprazolam has definitely helped but he still is very depressed and states again that except for his 3 dogs he would rather be dead. His pH Q9 score equals 15 supporting his depression diagnosis area he has an appointment next week with psychiatry. I think treating his depression will be difficult because he is convinced that antidepressants have caused his panic disorder. I tried to explain this may be a chemical issue and that the antidepressants may or may not explain the role.  Patient Active Problem List   Diagnosis Date Noted  . Anxiety and depression 03/22/2015  . Raynaud's phenomenon (by history or observed) 03/22/2015  . LPRD (laryngopharyngeal reflux disease) 01/29/2015  . Rhinitis medicamentosa 01/29/2015  . Moderate persistent asthma 01/29/2015  . Incidental lung nodule, greater than or equal to 3mm 07/19/2014  . History of smoking for 1-2 years 07/19/2014  . Arthritis   . Cervical radiculitis 12/20/2012  . Degenerative arthritis of cervical spine 11/15/2012  . OAB (overactive bladder) 07/22/2012  . BPH (benign prostatic hyperplasia) 07/22/2012  . Hypothyroidism 07/22/2012  . HLD (hyperlipidemia) 07/22/2012  . Allergic rhinitis due to pollen 05/06/2010  . IRRITABLE BOWEL SYNDROME 10/05/2007   Outpatient Encounter Prescriptions as of 07/18/2015  Medication Sig  . ADVAIR HFA 115-21 MCG/ACT inhaler Inhale 2 puffs into the lungs 2 (two) times daily.  Marland Kitchen ALPRAZolam (XANAX) 0.5 MG tablet Take 2 tablets (1 mg total) by mouth 3 (three) times daily as needed for anxiety. 8 am 2 pm 8 pm  . aspirin 81 MG tablet Take 81 mg by mouth daily.  . Budesonide (RHINOCORT AQUA NA) Place 1 puff into the nose. Three times a week  . Cholecalciferol (VITAMIN D) 2000 UNITS  CAPS Take 1 capsule by mouth daily.    Marland Kitchen levothyroxine (SYNTHROID, LEVOTHROID) 88 MCG tablet TAKE 1 TABLET EVERY DAY  . Multiple Vitamin (MULTIVITAMIN) tablet Take 1 tablet by mouth daily.    Marland Kitchen omeprazole (PRILOSEC) 20 MG capsule Take 1 capsule (20 mg total) by mouth 2 (two) times daily before a meal.  . simvastatin (ZOCOR) 20 MG tablet TAKE 1 TABLET (20 MG TOTAL) BY MOUTH AT BEDTIME.  . tamsulosin (FLOMAX) 0.4 MG CAPS capsule Take 1 capsule (0.4 mg total) by mouth daily.   No facility-administered encounter medications on file as of 07/18/2015.      Review of Systems  Constitutional: Negative.   Respiratory: Negative.   Cardiovascular: Negative.   Genitourinary: Positive for frequency.  Psychiatric/Behavioral: The patient is nervous/anxious.        Objective:   Physical Exam  Constitutional: He is oriented to person, place, and time. He appears well-developed and well-nourished.  Neurological: He is alert and oriented to person, place, and time.  Psychiatric:  Patient appears depressed. Does have good eye contact. Extreme an hedonia, early morning wakening   BP 139/86 mmHg  Pulse 69  Temp(Src) 97.9 F (36.6 C) (Oral)  Ht 5\' 9"  (1.753 m)  Wt 193 lb 12.8 oz (87.907 kg)  BMI 28.61 kg/m2        Assessment & Plan:  1. BPH (benign prostatic hyperplasia) He has seen urologist multiple times including a TURP. Still has overactive bladder. He thought that the Flomax may have helped initially but  not now so we'll increase dose from 0.4 0.8 with a meal. - tamsulosin (FLOMAX) 0.4 MG CAPS capsule; Take 2 capsules (0.8 mg total) by mouth daily.  Dispense: 30 capsule; Refill: 1  2. Anxiety and depression Continue alprazolam pending psychiatric visit next week  Wardell Honour MD

## 2015-07-23 ENCOUNTER — Telehealth: Payer: Self-pay

## 2015-07-23 ENCOUNTER — Ambulatory Visit (INDEPENDENT_AMBULATORY_CARE_PROVIDER_SITE_OTHER): Payer: 59 | Admitting: Psychiatry

## 2015-07-23 ENCOUNTER — Encounter (HOSPITAL_COMMUNITY): Payer: Self-pay | Admitting: Psychiatry

## 2015-07-23 VITALS — BP 126/80 | HR 78 | Ht 69.0 in | Wt 194.0 lb

## 2015-07-23 DIAGNOSIS — F332 Major depressive disorder, recurrent severe without psychotic features: Secondary | ICD-10-CM

## 2015-07-23 DIAGNOSIS — F410 Panic disorder [episodic paroxysmal anxiety] without agoraphobia: Secondary | ICD-10-CM

## 2015-07-23 MED ORDER — DULOXETINE HCL 20 MG PO CPEP: 20.0000 mg | ORAL_CAPSULE | Freq: Every day | ORAL | Status: DC

## 2015-07-23 NOTE — Patient Instructions (Signed)
Start cymbalta 20mg  once a day and increase to 2 tablets a day in one week. Start cutting down xanax by half tablet of xanax every week.
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
## 2015-07-23 NOTE — Telephone Encounter (Signed)
Insurance authorized Alprazolam through 02/23/16

## 2015-07-25 ENCOUNTER — Telehealth (HOSPITAL_COMMUNITY): Payer: Self-pay | Admitting: *Deleted

## 2015-07-25 NOTE — Telephone Encounter (Signed)
Prior authorization for Duloxetine received. Submitted online with cover my meds.  Awaiting response

## 2015-07-29 ENCOUNTER — Telehealth (HOSPITAL_COMMUNITY): Payer: Self-pay | Admitting: Psychiatry

## 2015-07-30 ENCOUNTER — Ambulatory Visit (INDEPENDENT_AMBULATORY_CARE_PROVIDER_SITE_OTHER): Payer: Medicare Other | Admitting: Allergy and Immunology

## 2015-07-30 ENCOUNTER — Encounter: Payer: Self-pay | Admitting: Allergy and Immunology

## 2015-07-30 VITALS — BP 128/78 | HR 72 | Resp 16

## 2015-07-30 DIAGNOSIS — T485X1D Poisoning by other anti-common-cold drugs, accidental (unintentional), subsequent encounter: Secondary | ICD-10-CM | POA: Diagnosis not present

## 2015-07-30 DIAGNOSIS — J387 Other diseases of larynx: Secondary | ICD-10-CM

## 2015-07-30 DIAGNOSIS — J4540 Moderate persistent asthma, uncomplicated: Secondary | ICD-10-CM | POA: Diagnosis not present

## 2015-07-30 DIAGNOSIS — J3089 Other allergic rhinitis: Secondary | ICD-10-CM | POA: Diagnosis not present

## 2015-07-30 DIAGNOSIS — F419 Anxiety disorder, unspecified: Secondary | ICD-10-CM

## 2015-07-30 DIAGNOSIS — K219 Gastro-esophageal reflux disease without esophagitis: Secondary | ICD-10-CM

## 2015-07-30 DIAGNOSIS — T485X5A Adverse effect of other anti-common-cold drugs, initial encounter: Secondary | ICD-10-CM

## 2015-07-30 DIAGNOSIS — J310 Chronic rhinitis: Secondary | ICD-10-CM | POA: Diagnosis not present

## 2015-07-30 MED ORDER — FLUTICASONE PROPIONATE HFA 110 MCG/ACT IN AERO: INHALATION_SPRAY | RESPIRATORY_TRACT | Status: DC

## 2015-07-30 NOTE — Telephone Encounter (Signed)
Return telephone call to pt. Per Dr. De Nurse, inform pt he will to change the time he takes the medication. Also, pt may want to take with food. Pt states he took Cymbalta at 3pm today and it helped with the jittery. Pt will need to continue reduce the Xanax as instructed by Dr. De Nurse.  Informed pt if symptoms worsen or if he feels more anxious, he will need to proceed to the local emergency room or urgent care. Pt is schedule for a f/u appt on 08/14/15.  Pt verbalizes understanding.

## 2015-07-30 NOTE — Patient Instructions (Addendum)
  1. Continue omeprazole 20 mg twice a day  2. Start Flovent 110 two inhalations twice a day with spacer  3. Stop Advair    4. Continue OTC Rhinocort one spray each nostril 3-7 times per week  5. Continue nasal saline wash, Systane eyedrops, Afrin, and ProAir or if needed  6. Return to clinic in 6 months or earlier if a problem

## 2015-07-30 NOTE — Progress Notes (Signed)
Follow-up Note  Referring Provider: Wardell Honour, MD Primary Provider: Wardell Honour, MD Date of Office Visit: 07/30/2015  Subjective:   [name] (DOB: 06-08-1940) is a 75 y.o. male who returns to the [facility] on 07/30/2015 in re-evaluation of the following:  HPI: Jashaun returns to this clinic in reevaluation of his asthma, allergic rhinoconjunctivitis, and rhinitis medicamentosa, reflux-induced respiratory disease. It is been 6 months since I've seen him in this clinic.   He has done very well regarding his asthma without any exacerbations and without any need to use a systemic steroid and without any need to use a short acting bronchodilator. While continuing on his Advair MDI with spacer he has not had any tongue soreness.   His nose is doing very well at this point in time and has not required an antibiotic for an episode of sinusitis while consistently using a combination of Afrin and Rhinocort and nasal saline. He has made an attempt to stop Afrin multiple times in the past and has been unsuccessful in doing so.   His reflux has been under excellent control while using omeprazole twice a day and he was successful in eliminating the use of his ranitidine during his last visit in this clinic.   He has been having some issues with depression and anxiety and he is now seeing a psychiatrist for this issue. He is very nervous and a combination of Xanax and Cymbalta appears to be working less than optimally at this point in time. `    Medication List           ADVAIR HFA 115-21 MCG/ACT inhaler  Generic drug:  fluticasone-salmeterol  Inhale 2 puffs into the lungs 2 (two) times daily.     ALPRAZolam 0.5 MG tablet  Commonly known as:  XANAX  Take 2 tablets (1 mg total) by mouth 3 (three) times daily as needed for anxiety. 8 am 2 pm 8 pm     aspirin 81 MG tablet  Take 81 mg by mouth daily.     DULoxetine 20 MG capsule  Commonly known as:   CYMBALTA  Take 1 capsule (20 mg total) by mouth daily. Can increase it to two tablets in one week     levothyroxine 88 MCG tablet  Commonly known as:  SYNTHROID, LEVOTHROID  TAKE 1 TABLET EVERY DAY     multivitamin tablet  Take 1 tablet by mouth daily.     omeprazole 20 MG capsule  Commonly known as:  PRILOSEC  Take 1 capsule (20 mg total) by mouth 2 (two) times daily before a meal.     RHINOCORT AQUA NA  Place 1 puff into the nose. Three times a week     simvastatin 20 MG tablet  Commonly known as:  ZOCOR  TAKE 1 TABLET (20 MG TOTAL) BY MOUTH AT BEDTIME.     tamsulosin 0.4 MG Caps capsule  Commonly known as:  FLOMAX  Take 2 capsules (0.8 mg total) by mouth daily.     Vitamin D 2000 units Caps  Take 1 capsule by mouth daily.        Past Medical History  Diagnosis Date  . Irritable bowel syndrome   . Allergic rhinitis, cause unspecified   . Unspecified asthma(493.90)   . Hyperlipidemia   . Hypothyroidism   . Kidney stones     per pt, not sure if had kidney stones.  . Obesity   . Arthritis  degenerative arthritis of the neck  . Allergy     a lot seasonal allergies  . Shoulder pain, right   . GERD (gastroesophageal reflux disease)   . Anxiety   . Depression   . Panic attacks 03/2015    Past Surgical History  Procedure Laterality Date  . Tonsillectomy    . Vasectomy    . Rotator cuff repair  2005    Left Shoulder  . Cataract extraction bilateral w/ anterior vitrectomy  2005  . Transurethral resection of prostate  2005    Allergies  Allergen Reactions  . Celexa [Citalopram Hydrobromide]     Panic attacks  . Procardia [Nifedipine] Other (See Comments)    Weakness   . Wellbutrin [Bupropion] Anxiety    Panic attacks    Review of systems negative except as noted in HPI / PMHx or noted below:  Review of Systems  Constitutional: Negative.   HENT: Negative.   Eyes: Negative.   Respiratory: Negative.   Cardiovascular: Negative.   Gastrointestinal:  Negative.   Genitourinary: Negative.   Musculoskeletal: Negative.   Skin: Negative.   Neurological: Negative.   Endo/Heme/Allergies: Negative.   Psychiatric/Behavioral: Negative.      Objective:   Filed Vitals:   07/30/15 1152  BP: 128/78  Pulse: 72  Resp: 16          Physical Exam  Constitutional: He is well-developed, well-nourished, and in no distress.  HENT:  Head: Normocephalic.  Right Ear: Tympanic membrane, external ear and ear canal normal.  Left Ear: Tympanic membrane, external ear and ear canal normal.  Nose: Nose normal. No mucosal edema or rhinorrhea.  Mouth/Throat: Uvula is midline, oropharynx is clear and moist and mucous membranes are normal. No oropharyngeal exudate.  Eyes: Conjunctivae are normal.  Neck: Trachea normal. No tracheal tenderness present. No tracheal deviation present. No thyromegaly present.  Cardiovascular: Normal rate, regular rhythm, S1 normal, S2 normal and normal heart sounds.   No murmur heard. Pulmonary/Chest: Breath sounds normal. No stridor. No respiratory distress. He has no wheezes. He has no rales.  Musculoskeletal: He exhibits no edema.  Lymphadenopathy:       Head (right side): No tonsillar adenopathy present.       Head (left side): No tonsillar adenopathy present.    He has no cervical adenopathy.  Neurological: He is alert. Gait normal.  Skin: No rash noted. He is not diaphoretic. No erythema. Nails show no clubbing.  Psychiatric: Mood and affect normal.    Diagnostics:    Spirometry was performed and demonstrated an FEV1 of 3.09 at 103 % of predicted.  The patient had an Asthma Control Test with the following results:  .    Assessment and Plan:   1. Moderate persistent asthma, uncomplicated   2. LPRD (laryngopharyngeal reflux disease)   3. Other allergic rhinitis   4. Rhinitis medicamentosa   5. Anxiety     1. Continue omeprazole 20 mg twice a day  2. Start Flovent 110 two inhalations twice a day with  spacer  3. Stop Advair    4. Continue OTC Rhinocort one spray each nostril 3-7 times per week  5. Continue nasal saline wash, Systane eyedrops, Afrin, and ProAir or if needed  6. Return to clinic in 6 months or earlier if a problem   i will have Tameka discontinue his long-acting bronchodilator as this may be contributing to some of the anxiety he's experiencing and will just try to control his asthma while using an inhaled  steroid. He will also continue to use therapy directed against reflux and he'll continue to use Rhinocort in combination with Afrin for his upper airway disease.Of course it would be best for him not to use Afrin but given his previous history of using this agent for many many years I think the best we can hope for is for him to consistently use a nasal steroid in conjunction with his Afrin to hopefully prevent the development of significant rebound.I will see him back in this clinic in 6 months or earlier if there is a problem  Allena Katz, MD [place]

## 2015-07-30 NOTE — Telephone Encounter (Signed)
Pt expressed concerns about Cymbalta making him feel jittery. Please advise.

## 2015-08-05 NOTE — Telephone Encounter (Signed)
Pt states he is feeling more anxious. Pt states he change time he takes Cymbalta from the morning to mid afternoon (2-3pm). Pt would like to speak with Dr. De Nurse about a medication change. Informed pt he could proceed to local urgent care, pt decline. Pt would prefer to schedule appt w/ Dr. De Nurse. Pt is schedule for a f/u appt on 6/13.

## 2015-08-06 ENCOUNTER — Encounter (HOSPITAL_COMMUNITY): Payer: Self-pay | Admitting: Psychiatry

## 2015-08-06 ENCOUNTER — Ambulatory Visit (INDEPENDENT_AMBULATORY_CARE_PROVIDER_SITE_OTHER): Payer: 59 | Admitting: Psychiatry

## 2015-08-06 ENCOUNTER — Ambulatory Visit: Payer: Medicare Other | Admitting: Family Medicine

## 2015-08-06 VITALS — BP 128/78 | HR 76 | Ht 69.0 in | Wt 192.0 lb

## 2015-08-06 DIAGNOSIS — F410 Panic disorder [episodic paroxysmal anxiety] without agoraphobia: Secondary | ICD-10-CM

## 2015-08-06 DIAGNOSIS — F332 Major depressive disorder, recurrent severe without psychotic features: Secondary | ICD-10-CM

## 2015-08-06 MED ORDER — CLONAZEPAM 0.5 MG PO TABS: 0.5000 mg | ORAL_TABLET | Freq: Every day | ORAL | Status: DC
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
## 2015-08-06 NOTE — Telephone Encounter (Signed)
Seen today, see notes

## 2015-08-07 ENCOUNTER — Encounter: Payer: Self-pay | Admitting: Family Medicine

## 2015-08-07 ENCOUNTER — Ambulatory Visit (INDEPENDENT_AMBULATORY_CARE_PROVIDER_SITE_OTHER): Payer: Medicare Other | Admitting: Family Medicine

## 2015-08-07 VITALS — BP 120/79 | HR 80 | Temp 98.1°F | Ht 69.0 in | Wt 191.2 lb

## 2015-08-07 DIAGNOSIS — F329 Major depressive disorder, single episode, unspecified: Secondary | ICD-10-CM

## 2015-08-07 DIAGNOSIS — F418 Other specified anxiety disorders: Secondary | ICD-10-CM | POA: Diagnosis not present

## 2015-08-07 DIAGNOSIS — F419 Anxiety disorder, unspecified: Principal | ICD-10-CM

## 2015-08-07 NOTE — Progress Notes (Signed)
Subjective:    Patient ID: [name], male    DOB: 1940/03/09, 75 y.o.   MRN: [id number]  HPI 75 year old male with anxiety and depression. Since his last visit here he has seen psychiatry who started him on Cymbalta but he has already failed that therapy. He has been taking fairly large amount of alprazolam and I was sure that would be criticized by metal health professional but really seem to be helping his anxiety and/or panic disorder. He has now decided he would like to stop the Cymbalta and taper himself down on alprazolam. I endorsed this plan. We will continue to work to get him to see a psychiatrist who can perhaps help him. He is to seek out the name of psychiatry from a friend who has seen one that the friend likes.  Patient Active Problem List   Diagnosis Date Noted  . Anxiety and depression 03/22/2015  . Raynaud's phenomenon (by history or observed) 03/22/2015  . LPRD (laryngopharyngeal reflux disease) 01/29/2015  . Rhinitis medicamentosa 01/29/2015  . Moderate persistent asthma 01/29/2015  . Incidental lung nodule, greater than or equal to 21mm 07/19/2014  . History of smoking for 1-2 years 07/19/2014  . Arthritis   . Cervical radiculitis 12/20/2012  . Degenerative arthritis of cervical spine 11/15/2012  . OAB (overactive bladder) 07/22/2012  . BPH (benign prostatic hyperplasia) 07/22/2012  . Hypothyroidism 07/22/2012  . HLD (hyperlipidemia) 07/22/2012  . Allergic rhinitis due to pollen 05/06/2010  . IRRITABLE BOWEL SYNDROME 10/05/2007   Outpatient Encounter Prescriptions as of 08/07/2015  Medication Sig  . ADVAIR HFA 115-21 MCG/ACT inhaler Inhale 2 puffs into the lungs 2 (two) times daily.  Marland Kitchen ALPRAZolam (XANAX) 0.5 MG tablet Take 2 tablets (1 mg total) by mouth 3 (three) times daily as needed for anxiety. 8 am 2 pm 8 pm  . aspirin 81 MG tablet Take 81 mg by mouth daily.  . Budesonide (RHINOCORT AQUA NA) Place 1 puff into the nose. Three times a week  .  Cholecalciferol (VITAMIN D) 2000 UNITS CAPS Take 1 capsule by mouth daily.    . clonazePAM (KLONOPIN) 0.5 MG tablet Take 1 tablet (0.5 mg total) by mouth daily. Take at 2pm. Do not take xanax during the middle of the day when taking xanax.  . fluticasone (FLOVENT HFA) 110 MCG/ACT inhaler INHALE TWO PUFFS ONCE DAILY TO PREVENT COUGH OR WHEEZE. RINSE MOUTH AFTER USE.  Marland Kitchen levothyroxine (SYNTHROID, LEVOTHROID) 88 MCG tablet TAKE 1 TABLET EVERY DAY  . Multiple Vitamin (MULTIVITAMIN) tablet Take 1 tablet by mouth daily.    Marland Kitchen omeprazole (PRILOSEC) 20 MG capsule Take 1 capsule (20 mg total) by mouth 2 (two) times daily before a meal.  . simvastatin (ZOCOR) 20 MG tablet TAKE 1 TABLET (20 MG TOTAL) BY MOUTH AT BEDTIME.  . tamsulosin (FLOMAX) 0.4 MG CAPS capsule Take 2 capsules (0.8 mg total) by mouth daily.   No facility-administered encounter medications on file as of 08/07/2015.      Review of Systems  Psychiatric/Behavioral: Positive for suicidal ideas and sleep disturbance. The patient is nervous/anxious.        Objective:   Physical Exam  Constitutional: He appears well-developed and well-nourished.  Psychiatric: His behavior is normal.  Patient seems to obsess about his great sadness and anxiety. He says that he did not have these problems till SSRI was started about 4 months ago. Again I think he is depressed but I doubt that the medicines are really contributing that much to  his issues with anxiety or panic disorder   BP 120/79 mmHg  Pulse 80  Temp(Src) 98.1 F (36.7 C) (Oral)  Ht 5\' 9"  (1.753 m)  Wt 191 lb 3.2 oz (86.728 kg)  BMI 28.22 kg/m2        Assessment & Plan:  1. Anxiety and depression Continue to taper alprazolam. Will seek psychiatric referral working with patient preference. If he can identify the name of psychiatry we will make the referral. If not consider referring to [facility]  Wardell Honour MD

## 2015-08-08 ENCOUNTER — Telehealth: Payer: Self-pay

## 2015-08-08 ENCOUNTER — Telehealth: Payer: Self-pay | Admitting: Family Medicine

## 2015-08-08 NOTE — Telephone Encounter (Signed)
Patient said he talked to you about referring him to psychiatry at [facility]   He wants to do that with you telling referrals exactly what he needs

## 2015-08-08 NOTE — Telephone Encounter (Signed)
Call given to referrals

## 2015-08-13 ENCOUNTER — Other Ambulatory Visit: Payer: Self-pay

## 2015-08-13 DIAGNOSIS — F32A Depression, unspecified: Secondary | ICD-10-CM

## 2015-08-13 DIAGNOSIS — F329 Major depressive disorder, single episode, unspecified: Secondary | ICD-10-CM

## 2015-08-13 NOTE — Telephone Encounter (Signed)
Please refer to [facility]; problem is depression/anxiety.  First available appt

## 2015-08-14 ENCOUNTER — Ambulatory Visit (HOSPITAL_COMMUNITY): Payer: Self-pay | Admitting: Psychiatry

## 2015-08-14 ENCOUNTER — Ambulatory Visit (HOSPITAL_COMMUNITY): Payer: Medicare Other | Admitting: Psychiatry

## 2015-08-15 ENCOUNTER — Other Ambulatory Visit: Payer: Self-pay | Admitting: Family Medicine

## 2015-08-15 ENCOUNTER — Ambulatory Visit (HOSPITAL_COMMUNITY): Payer: Self-pay | Admitting: Licensed Clinical Social Worker

## 2015-08-15 NOTE — Telephone Encounter (Signed)
Last seen 08/07/15  Dr Sabra Heck  If approved route to nurse to call into [facility]

## 2015-08-16 NOTE — Telephone Encounter (Signed)
Prescription was called in, patient aware

## 2015-08-19 ENCOUNTER — Ambulatory Visit (INDEPENDENT_AMBULATORY_CARE_PROVIDER_SITE_OTHER): Payer: Medicare Other | Admitting: Family Medicine

## 2015-08-19 ENCOUNTER — Encounter: Payer: Self-pay | Admitting: Family Medicine

## 2015-08-19 VITALS — BP 107/76 | HR 76 | Temp 96.8°F | Ht 69.0 in | Wt 190.0 lb

## 2015-08-19 DIAGNOSIS — R350 Frequency of micturition: Secondary | ICD-10-CM | POA: Diagnosis not present

## 2015-08-19 LAB — URINALYSIS, COMPLETE
Bilirubin, UA: NEGATIVE
GLUCOSE, UA: NEGATIVE
Ketones, UA: NEGATIVE
Leukocytes, UA: NEGATIVE
NITRITE UA: NEGATIVE
PH UA: 5.5 (ref 5.0–7.5)
Protein, UA: NEGATIVE
RBC, UA: NEGATIVE
Specific Gravity, UA: 1.025 (ref 1.005–1.030)
UUROB: 0.2 mg/dL (ref 0.2–1.0)

## 2015-08-19 LAB — MICROSCOPIC EXAMINATION
Bacteria, UA: NONE SEEN
EPITHELIAL CELLS (NON RENAL): NONE SEEN /HPF (ref 0–10)
RBC MICROSCOPIC, UA: NONE SEEN /HPF (ref 0–?)
WBC UA: NONE SEEN /HPF (ref 0–?)

## 2015-08-19 MED ORDER — CIPROFLOXACIN HCL 500 MG PO TABS: 500.0000 mg | ORAL_TABLET | Freq: Two times a day (BID) | ORAL | Status: DC

## 2015-08-19 NOTE — Progress Notes (Signed)
Subjective:    Patient ID: [name], male    DOB: [birth year], 75 y.o.   MRN: [id number]  HPI Patient here today for frequent urination. This is a chronic problem. He formerly was followed by urology who told him there was nothing else they could do to help this problem. The problem is not always present some nights are better than others as far as frequent urination. He asked about self-catheterization as a remedy for his overactive bladder     Patient Active Problem List   Diagnosis Date Noted  . Anxiety and depression 03/22/2015  . Raynaud's phenomenon (by history or observed) 03/22/2015  . LPRD (laryngopharyngeal reflux disease) 01/29/2015  . Rhinitis medicamentosa 01/29/2015  . Moderate persistent asthma 01/29/2015  . Incidental lung nodule, greater than or equal to 60mm 07/19/2014  . History of smoking for 1-2 years 07/19/2014  . Arthritis   . Cervical radiculitis 12/20/2012  . Degenerative arthritis of cervical spine 11/15/2012  . OAB (overactive bladder) 07/22/2012  . BPH (benign prostatic hyperplasia) 07/22/2012  . Hypothyroidism 07/22/2012  . HLD (hyperlipidemia) 07/22/2012  . Allergic rhinitis due to pollen 05/06/2010  . IRRITABLE BOWEL SYNDROME 10/05/2007   Outpatient Encounter Prescriptions as of 08/19/2015  Medication Sig  . ADVAIR HFA 115-21 MCG/ACT inhaler Inhale 2 puffs into the lungs 2 (two) times daily.  Marland Kitchen ALPRAZolam (XANAX) 0.5 MG tablet TAKE TWO TABLETS BY MOUTH THREE TIMES DAILY (8AM, 2PM, AND 8PM)  . aspirin 81 MG tablet Take 81 mg by mouth daily.  . Budesonide (RHINOCORT AQUA NA) Place 1 puff into the nose. Three times a week  . Cholecalciferol (VITAMIN D) 2000 UNITS CAPS Take 1 capsule by mouth daily.    . fluticasone (FLOVENT HFA) 110 MCG/ACT inhaler INHALE TWO PUFFS ONCE DAILY TO PREVENT COUGH OR WHEEZE. RINSE MOUTH AFTER USE.  Marland Kitchen levothyroxine (SYNTHROID, LEVOTHROID) 88 MCG tablet TAKE 1 TABLET EVERY DAY  . Multiple Vitamin (MULTIVITAMIN) tablet  Take 1 tablet by mouth daily.    Marland Kitchen omeprazole (PRILOSEC) 20 MG capsule Take 1 capsule (20 mg total) by mouth 2 (two) times daily before a meal.  . simvastatin (ZOCOR) 20 MG tablet TAKE 1 TABLET (20 MG TOTAL) BY MOUTH AT BEDTIME.  . tamsulosin (FLOMAX) 0.4 MG CAPS capsule Take 2 capsules (0.8 mg total) by mouth daily.  . [DISCONTINUED] clonazePAM (KLONOPIN) 0.5 MG tablet Take 1 tablet (0.5 mg total) by mouth daily. Take at 2pm. Do not take xanax during the middle of the day when taking xanax.   No facility-administered encounter medications on file as of 08/19/2015.      Review of Systems  Constitutional: Negative.   HENT: Negative.   Eyes: Negative.   Respiratory: Negative.   Cardiovascular: Negative.   Gastrointestinal: Negative.   Endocrine: Negative.   Genitourinary: Positive for frequency (worse at night).  Musculoskeletal: Negative.   Skin: Negative.   Allergic/Immunologic: Negative.   Neurological: Negative.   Hematological: Negative.   Psychiatric/Behavioral: Negative.        Depression       Objective:   Physical Exam  Constitutional: He appears well-developed and well-nourished.  Genitourinary:  Prostate is not enlarged it at all and there are no nodules. It is slightly boggy suggestive of prostatitis.  Psychiatric:  The psych consult is stable for now. We spent some time talking about possibility of [place] into [place]. If he truly is a sociable person and is willing to reach  out to other people I think that would help his loneliness and less depression. He will look into this matter further   BP 107/76 mmHg  Pulse 76  Temp(Src) 96.8 F (36 C) (Oral)  Ht 5\' 9"  (1.753 m)  Wt 190 lb (86.183 kg)  BMI 28.05 kg/m2        Assessment & Plan:  1. Frequent urination Running problem. Raises a suspicion of chronic prostatitis with flares a result in increased urination. Probably some of his frequency is related to his  anxiety as well. He is continuing to take Xanax 2-1/2-3 tablets per day  Wardell Honour MD - Urinalysis, Complete

## 2015-08-20 ENCOUNTER — Ambulatory Visit (HOSPITAL_COMMUNITY): Payer: Self-pay | Admitting: Psychiatry

## 2015-08-21 ENCOUNTER — Telehealth: Payer: Self-pay | Admitting: Family Medicine

## 2015-08-21 DIAGNOSIS — N410 Acute prostatitis: Secondary | ICD-10-CM

## 2015-08-21 NOTE — Telephone Encounter (Signed)
I had explained that Cipro was for possible prostatitis. I know of no other medicine which might help if it is prostatitis.

## 2015-08-22 NOTE — Telephone Encounter (Signed)
Refer to urologist 

## 2015-08-22 NOTE — Telephone Encounter (Signed)
Pt states he isn't going to take it. Pt really just wants to self catheterize himself. Pt is requesting a referral to [facility].

## 2015-08-28 ENCOUNTER — Other Ambulatory Visit: Payer: Self-pay | Admitting: *Deleted

## 2015-08-28 MED ORDER — OMEPRAZOLE 20 MG PO CPDR: 20.0000 mg | DELAYED_RELEASE_CAPSULE | Freq: Two times a day (BID) | ORAL | Status: DC

## 2015-08-30 ENCOUNTER — Telehealth: Payer: Self-pay | Admitting: *Deleted

## 2015-08-30 MED ORDER — FLUTICASONE PROPIONATE HFA 110 MCG/ACT IN AERO: INHALATION_SPRAY | RESPIRATORY_TRACT | Status: DC

## 2015-08-30 NOTE — Telephone Encounter (Signed)
Patient called questioning Flovent dosage advised patient that in Dr Bruna Potter note it states to use Flovent 110 2 puffs twice daily but on prescription sent in it states take 2 puffs once daily advised patient prescription was sent in incorrectly apologized. Will send in new prescription with correct dosage. Patient verbalized understanding will pick up and start Flovent is finishing Advair inhaler he had first.

## 2015-09-03 ENCOUNTER — Ambulatory Visit: Payer: Medicare Other | Admitting: Family Medicine

## 2015-09-03 ENCOUNTER — Encounter: Payer: Self-pay | Admitting: Family Medicine

## 2015-09-03 ENCOUNTER — Ambulatory Visit (INDEPENDENT_AMBULATORY_CARE_PROVIDER_SITE_OTHER): Payer: Medicare Other | Admitting: Family Medicine

## 2015-09-03 VITALS — BP 145/89 | HR 79 | Temp 97.1°F | Ht 69.0 in | Wt 188.4 lb

## 2015-09-03 DIAGNOSIS — N3281 Overactive bladder: Secondary | ICD-10-CM

## 2015-09-03 DIAGNOSIS — F32A Depression, unspecified: Secondary | ICD-10-CM

## 2015-09-03 DIAGNOSIS — F418 Other specified anxiety disorders: Secondary | ICD-10-CM

## 2015-09-03 DIAGNOSIS — F329 Major depressive disorder, single episode, unspecified: Secondary | ICD-10-CM

## 2015-09-03 DIAGNOSIS — F419 Anxiety disorder, unspecified: Secondary | ICD-10-CM

## 2015-09-03 NOTE — Progress Notes (Signed)
Subjective:    Patient ID: [name], male    DOB: 1941/02/02, 75 y.o.   MRN: [id number]  HPI Patient is here today to discuss referral to psychiatry and frequent urination.  At his last visit I prescribed Cipro for what seemed to be prostate symptoms with findings of a boggy prostate. He decided he did not want to take Cipro because he read about side effects. I explained that I would not give him a medicine that would purposely injure him that all medicines that carries some risk and if he was not related to take the risk probably should not complain of symptoms and ask for treatment.  We talked again about his anxiety. Think it would be better for him to see psychiatry but we've had some difficulties with referrals. Apparently [facility] will not take referral and less is within their "walls.". He continues to take Xanax 2-1/2-3 tablets a day 0.5 mg. Symptoms seem to been managed on that regard. He did have the experience of putting one of his pet dogs to sleep and it created more anxiety yesterday and today, requiring additional Xanax.   Review of Systems  Constitutional: Negative.   HENT: Negative.   Eyes: Negative.   Respiratory: Negative.   Cardiovascular: Negative.   Gastrointestinal: Negative.   Endocrine: Negative.   Genitourinary: Positive for frequency.  Musculoskeletal: Negative.   Skin: Negative.   Allergic/Immunologic: Negative.   Neurological: Negative.   Hematological: Negative.   Psychiatric/Behavioral: Negative.     Depression screen [facility] 2/9 09/03/2015 08/19/2015 07/18/2015 07/05/2015 04/25/2015  Decreased Interest 3 3 3 1 2   Down, Depressed, Hopeless 3 - 3 2 3   PHQ - 2 Score 6 3 6 3 5   Altered sleeping 2 3 3 1  0  Tired, decreased energy 3 3 3 1 1   Change in appetite 1 0 0 0 3  Feeling bad or failure about yourself  3 3 3 2 3   Trouble concentrating 3 3 3 1  0  Moving slowly or fidgety/restless 0 0 0 1 0  Suicidal thoughts 3 3 3 1 1   PHQ-9 Score 21 18  21 10 13   Difficult doing work/chores Very difficult Somewhat difficult - Somewhat difficult -      Patient Active Problem List   Diagnosis Date Noted  . Anxiety and depression 03/22/2015  . Raynaud's phenomenon (by history or observed) 03/22/2015  . LPRD (laryngopharyngeal reflux disease) 01/29/2015  . Rhinitis medicamentosa 01/29/2015  . Moderate persistent asthma 01/29/2015  . Incidental lung nodule, greater than or equal to 6mm 07/19/2014  . History of smoking for 1-2 years 07/19/2014  . Arthritis   . Cervical radiculitis 12/20/2012  . Degenerative arthritis of cervical spine 11/15/2012  . OAB (overactive bladder) 07/22/2012  . BPH (benign prostatic hyperplasia) 07/22/2012  . Hypothyroidism 07/22/2012  . HLD (hyperlipidemia) 07/22/2012  . Allergic rhinitis due to pollen 05/06/2010  . IRRITABLE BOWEL SYNDROME 10/05/2007   Outpatient Encounter Prescriptions as of 09/03/2015  Medication Sig  . ADVAIR HFA 115-21 MCG/ACT inhaler Inhale 2 puffs into the lungs 2 (two) times daily.  Marland Kitchen ALPRAZolam (XANAX) 0.5 MG tablet TAKE TWO TABLETS BY MOUTH THREE TIMES DAILY (8AM, 2PM, AND 8PM)  . aspirin 81 MG tablet Take 81 mg by mouth daily.  . Budesonide (RHINOCORT AQUA NA) Place 1 puff into the nose. Three times a week  . Cholecalciferol (VITAMIN D) 2000 UNITS CAPS Take 1 capsule by mouth daily.    . fluticasone (FLOVENT HFA) 110  MCG/ACT inhaler INHALE TWO PUFFS TWICE DAILY TO PREVENT COUGH OR WHEEZE. RINSE MOUTH AFTER USE.  Marland Kitchen levothyroxine (SYNTHROID, LEVOTHROID) 88 MCG tablet TAKE 1 TABLET EVERY DAY  . Multiple Vitamin (MULTIVITAMIN) tablet Take 1 tablet by mouth daily.    Marland Kitchen omeprazole (PRILOSEC) 20 MG capsule Take 1 capsule (20 mg total) by mouth 2 (two) times daily before a meal.  . simvastatin (ZOCOR) 20 MG tablet TAKE 1 TABLET (20 MG TOTAL) BY MOUTH AT BEDTIME.  . tamsulosin (FLOMAX) 0.4 MG CAPS capsule Take 2 capsules (0.8 mg total) by mouth daily.  . [DISCONTINUED] ciprofloxacin  (CIPRO) 500 MG tablet Take 1 tablet (500 mg total) by mouth 2 (two) times daily. (Patient not taking: Reported on 09/03/2015)   No facility-administered encounter medications on file as of 09/03/2015.        Objective:   Physical Exam  Constitutional: He appears well-developed and well-nourished.  Psychiatric: He has a normal mood and affect. His behavior is normal.  History content is followed at times. He is very argumentative and does not want to accept help and it is becoming increasingly exasperating trying to deal with him    BP 145/89 mmHg  Pulse 79  Temp(Src) 97.1 F (36.2 C) (Oral)  Ht 5\' 9"  (1.753 m)  Wt 188 lb 6.4 oz (85.458 kg)  BMI 27.81 kg/m2       Assessment & Plan:  1. OAB (overactive bladder) He finally have a referral to the [facility] urology. They had previously told him there is nothing else I could do to help him and that he was "impossible."  2. Anxiety and depression Continue with low-dose at next for now still seeking psychiatric referral  Wardell Honour MD

## 2015-09-05 ENCOUNTER — Telehealth: Payer: Self-pay | Admitting: Family Medicine

## 2015-09-05 DIAGNOSIS — H26491 Other secondary cataract, right eye: Secondary | ICD-10-CM | POA: Diagnosis not present

## 2015-09-09 ENCOUNTER — Telehealth: Payer: Self-pay | Admitting: Family Medicine

## 2015-09-09 NOTE — Telephone Encounter (Signed)
Dr.Miller called Dr.Bowen 7/13 regarding [name]. Dr.Bowen is returning the call about patient. Covering PCP, please advise.

## 2015-09-09 NOTE — Telephone Encounter (Signed)
More details are needed on this request to call a physician back on a patient that I'm not even familiar with.

## 2015-09-12 ENCOUNTER — Telehealth: Payer: Self-pay | Admitting: *Deleted

## 2015-09-12 NOTE — Telephone Encounter (Signed)
Spoke with pt regarding depression Will try to get pt's provider list for mental health to help pt schedule appt

## 2015-09-17 ENCOUNTER — Ambulatory Visit: Payer: Medicare Other | Admitting: Family Medicine

## 2015-09-17 NOTE — Telephone Encounter (Signed)
Attempted to call Dr. Franne Grip per request in in basket. No answer

## 2015-09-24 ENCOUNTER — Telehealth: Payer: Self-pay | Admitting: Family Medicine

## 2015-09-24 DIAGNOSIS — N400 Enlarged prostate without lower urinary tract symptoms: Secondary | ICD-10-CM

## 2015-09-25 MED ORDER — SIMVASTATIN 20 MG PO TABS: ORAL_TABLET | ORAL | 1 refills | Status: DC

## 2015-09-25 MED ORDER — TAMSULOSIN HCL 0.4 MG PO CAPS: 0.8000 mg | ORAL_CAPSULE | Freq: Every day | ORAL | 2 refills | Status: DC

## 2015-09-25 MED ORDER — LEVOTHYROXINE SODIUM 88 MCG PO TABS: 88.0000 ug | ORAL_TABLET | Freq: Every day | ORAL | 5 refills | Status: DC

## 2015-09-25 NOTE — Telephone Encounter (Signed)
Refilled Simivastatin, levothyroxine, and flomax to [facility]

## 2015-10-10 ENCOUNTER — Ambulatory Visit: Payer: Medicare Other | Admitting: Family Medicine

## 2015-10-11 ENCOUNTER — Ambulatory Visit (INDEPENDENT_AMBULATORY_CARE_PROVIDER_SITE_OTHER): Payer: Medicare Other | Admitting: Urology

## 2015-10-11 DIAGNOSIS — N401 Enlarged prostate with lower urinary tract symptoms: Secondary | ICD-10-CM | POA: Diagnosis not present

## 2015-10-11 DIAGNOSIS — R350 Frequency of micturition: Secondary | ICD-10-CM

## 2015-10-11 DIAGNOSIS — R3911 Hesitancy of micturition: Secondary | ICD-10-CM

## 2015-10-11 DIAGNOSIS — R3912 Poor urinary stream: Secondary | ICD-10-CM

## 2015-10-15 ENCOUNTER — Ambulatory Visit (INDEPENDENT_AMBULATORY_CARE_PROVIDER_SITE_OTHER): Payer: Medicare Other | Admitting: Family Medicine

## 2015-10-15 ENCOUNTER — Encounter: Payer: Self-pay | Admitting: Family Medicine

## 2015-10-15 VITALS — BP 119/79 | HR 79 | Temp 98.0°F | Ht 69.0 in | Wt 188.0 lb

## 2015-10-15 DIAGNOSIS — F418 Other specified anxiety disorders: Secondary | ICD-10-CM

## 2015-10-15 DIAGNOSIS — F419 Anxiety disorder, unspecified: Principal | ICD-10-CM

## 2015-10-15 DIAGNOSIS — N400 Enlarged prostate without lower urinary tract symptoms: Secondary | ICD-10-CM

## 2015-10-15 DIAGNOSIS — F329 Major depressive disorder, single episode, unspecified: Secondary | ICD-10-CM

## 2015-10-15 MED ORDER — ALPRAZOLAM 0.5 MG PO TABS: ORAL_TABLET | ORAL | 1 refills | Status: DC

## 2015-10-15 NOTE — Progress Notes (Signed)
Subjective:    Patient ID: [name], male    DOB: 1940/05/19, 75 y.o.   MRN: [id number]  HPI Patient here today for follow up from urology.  He has been having some nocturia and salt urologist who did some post void residual ultrasounds and other flow studies and determined that he has some narrowing of the urethra probably related to prostatic hypertrophy. He has another study scheduled mid-September to see if he might be a candidate for a repeat TURP. He has also seen a therapist who works in conjunction with psychiatrist and has appointment to see the medical doctor in this practice in a few weeks. He is taking Xanax about 3 per day for anxiety. He denies any recent what he has called "panic attacks". He is on no antidepressants now. He does seem a lot more calm and accepting of his situation. He still remains lonely after the death of his wife several years ago now.   Patient Active Problem List   Diagnosis Date Noted  . Anxiety and depression 03/22/2015  . Raynaud's phenomenon (by history or observed) 03/22/2015  . LPRD (laryngopharyngeal reflux disease) 01/29/2015  . Rhinitis medicamentosa 01/29/2015  . Moderate persistent asthma 01/29/2015  . Incidental lung nodule, greater than or equal to 89mm 07/19/2014  . History of smoking for 1-2 years 07/19/2014  . Arthritis   . Cervical radiculitis 12/20/2012  . Degenerative arthritis of cervical spine 11/15/2012  . OAB (overactive bladder) 07/22/2012  . BPH (benign prostatic hyperplasia) 07/22/2012  . Hypothyroidism 07/22/2012  . HLD (hyperlipidemia) 07/22/2012  . Allergic rhinitis due to pollen 05/06/2010  . IRRITABLE BOWEL SYNDROME 10/05/2007   Outpatient Encounter Prescriptions as of 10/15/2015  Medication Sig  . alfuzosin (UROXATRAL) 10 MG 24 hr tablet   . ALPRAZolam (XANAX) 0.5 MG tablet TAKE TWO TABLETS BY MOUTH THREE TIMES DAILY (8AM, 2PM, AND 8PM)  . aspirin 81 MG tablet Take 81 mg by mouth daily.  . Budesonide  (RHINOCORT AQUA NA) Place 1 puff into the nose. Three times a week  . Cholecalciferol (VITAMIN D) 2000 UNITS CAPS Take 1 capsule by mouth daily.    . fluticasone (FLOVENT HFA) 110 MCG/ACT inhaler INHALE TWO PUFFS TWICE DAILY TO PREVENT COUGH OR WHEEZE. RINSE MOUTH AFTER USE.  Marland Kitchen levothyroxine (SYNTHROID, LEVOTHROID) 88 MCG tablet Take 1 tablet (88 mcg total) by mouth daily.  . Multiple Vitamin (MULTIVITAMIN) tablet Take 1 tablet by mouth daily.    Marland Kitchen omeprazole (PRILOSEC) 20 MG capsule Take 1 capsule (20 mg total) by mouth 2 (two) times daily before a meal.  . simvastatin (ZOCOR) 20 MG tablet TAKE 1 TABLET (20 MG TOTAL) BY MOUTH AT BEDTIME.  . [DISCONTINUED] ADVAIR HFA [id number] MCG/ACT inhaler Inhale 2 puffs into the lungs 2 (two) times daily.  . [DISCONTINUED] tamsulosin (FLOMAX) 0.4 MG CAPS capsule Take 2 capsules (0.8 mg total) by mouth daily.   No facility-administered encounter medications on file as of 10/15/2015.      Review of Systems  Constitutional: Negative.   HENT: Negative.   Eyes: Negative.   Respiratory: Negative.   Cardiovascular: Negative.   Gastrointestinal: Negative.   Endocrine: Negative.   Genitourinary: Negative.   Musculoskeletal: Negative.   Skin: Negative.   Allergic/Immunologic: Negative.   Neurological: Negative.   Hematological: Negative.   Psychiatric/Behavioral: Negative.        Objective:   Physical Exam  Constitutional: He is oriented to person, place, and time. He appears well-developed and well-nourished.  Cardiovascular: Normal  rate and regular rhythm.   Pulmonary/Chest: Effort normal and breath sounds normal.  Neurological: He is alert and oriented to person, place, and time.  Psychiatric: He has a normal mood and affect. His behavior is normal.   BP 119/79 (BP Location: Left Arm)   Pulse 79   Temp 98 F (36.7 C) (Oral)   Ht 5\' 9"  (1.753 m)   Wt 188 lb (85.3 kg)   BMI 27.76 kg/m         Assessment & Plan:  1. Anxiety and  depression New with Xanax. I think his chances of abuse are small  2. BPH (benign prostatic hyperplasia) Problem related to nocturia is being worked up and evaluated by urology  Wardell Honour MD

## 2015-11-18 DIAGNOSIS — R350 Frequency of micturition: Secondary | ICD-10-CM | POA: Diagnosis not present

## 2015-11-18 DIAGNOSIS — R3912 Poor urinary stream: Secondary | ICD-10-CM | POA: Diagnosis not present

## 2015-12-06 ENCOUNTER — Ambulatory Visit (INDEPENDENT_AMBULATORY_CARE_PROVIDER_SITE_OTHER): Payer: Medicare Other | Admitting: Urology

## 2015-12-06 DIAGNOSIS — N3281 Overactive bladder: Secondary | ICD-10-CM

## 2015-12-06 DIAGNOSIS — R3912 Poor urinary stream: Secondary | ICD-10-CM | POA: Diagnosis not present

## 2015-12-06 DIAGNOSIS — N401 Enlarged prostate with lower urinary tract symptoms: Secondary | ICD-10-CM

## 2015-12-06 DIAGNOSIS — R351 Nocturia: Secondary | ICD-10-CM | POA: Diagnosis not present

## 2015-12-17 ENCOUNTER — Other Ambulatory Visit: Payer: Self-pay | Admitting: *Deleted

## 2015-12-17 MED ORDER — FLUTICASONE PROPIONATE HFA 110 MCG/ACT IN AERO: INHALATION_SPRAY | RESPIRATORY_TRACT | 0 refills | Status: DC

## 2015-12-20 ENCOUNTER — Other Ambulatory Visit: Payer: Self-pay

## 2015-12-20 MED ORDER — OMEPRAZOLE 20 MG PO CPDR: 20.0000 mg | DELAYED_RELEASE_CAPSULE | Freq: Two times a day (BID) | ORAL | 1 refills | Status: DC

## 2015-12-27 ENCOUNTER — Ambulatory Visit (HOSPITAL_COMMUNITY): Payer: Self-pay | Admitting: Psychiatry

## 2016-01-10 ENCOUNTER — Ambulatory Visit (INDEPENDENT_AMBULATORY_CARE_PROVIDER_SITE_OTHER): Payer: Medicare Other | Admitting: Urology

## 2016-01-10 DIAGNOSIS — N401 Enlarged prostate with lower urinary tract symptoms: Secondary | ICD-10-CM | POA: Diagnosis not present

## 2016-01-10 DIAGNOSIS — R3912 Poor urinary stream: Secondary | ICD-10-CM

## 2016-01-10 DIAGNOSIS — R350 Frequency of micturition: Secondary | ICD-10-CM | POA: Diagnosis not present

## 2016-01-21 ENCOUNTER — Ambulatory Visit: Payer: Medicare Other | Admitting: Family Medicine

## 2016-01-30 NOTE — Progress Notes (Signed)
 Subjective:    Patient ID: [name], male    DOB: 06/03/1940, 75 y.o.   MRN: [id number]  HPI 75-year-old gentleman who is followed here for anxiety and depression. He currently is seeing a psychiatrist who has him on mirtazapine. Symptoms are generally improved but he still suffers from extreme loneliness and melancholy. He repeats much of what he is told me before about his wife and his loneliness and but says he does not really think about killing himself any longer. He is followed by urologist and is initiating acupuncture for his BPH.  Patient Active Problem List   Diagnosis Date Noted  . Anxiety and depression 03/22/2015  . Raynaud's phenomenon (by history or observed) 03/22/2015  . LPRD (laryngopharyngeal reflux disease) 01/29/2015  . Rhinitis medicamentosa 01/29/2015  . Moderate persistent asthma 01/29/2015  . Incidental lung nodule, greater than or equal to 8mm 07/19/2014  . History of smoking for 1-2 years 07/19/2014  . Arthritis   . Cervical radiculitis 12/20/2012  . Degenerative arthritis of cervical spine 11/15/2012  . OAB (overactive bladder) 07/22/2012  . BPH (benign prostatic hyperplasia) 07/22/2012  . Hypothyroidism 07/22/2012  . HLD (hyperlipidemia) 07/22/2012  . Allergic rhinitis due to pollen 05/06/2010  . IRRITABLE BOWEL SYNDROME 10/05/2007   Outpatient Encounter Prescriptions as of 01/31/2016  Medication Sig  . ALPRAZolam (XANAX) 0.5 MG tablet TAKE TWO TABLETS BY MOUTH THREE TIMES DAILY (8AM, 2PM, AND 8PM)  . aspirin 81 MG tablet Take 81 mg by mouth daily.  . Budesonide (RHINOCORT AQUA NA) Place 1 puff into the nose. Three times a week  . Cholecalciferol (VITAMIN D) 2000 UNITS CAPS Take 1 capsule by mouth daily.    . fluticasone (FLOVENT HFA) 110 MCG/ACT inhaler INHALE TWO PUFFS TWICE DAILY TO PREVENT COUGH OR WHEEZE. RINSE MOUTH AFTER USE.  . levothyroxine (SYNTHROID, LEVOTHROID) 88 MCG tablet Take 1 tablet (88 mcg total) by mouth daily.  . mirtazapine  (REMERON) 15 MG tablet Take 0.5 tablets by mouth at bedtime.  . Multiple Vitamin (MULTIVITAMIN) tablet Take 1 tablet by mouth daily.    . omeprazole (PRILOSEC) 20 MG capsule Take 1 capsule (20 mg total) by mouth 2 (two) times daily before a meal.  . simvastatin (ZOCOR) 20 MG tablet TAKE 1 TABLET (20 MG TOTAL) BY MOUTH AT BEDTIME.  . [DISCONTINUED] alfuzosin (UROXATRAL) 10 MG 24 hr tablet    No facility-administered encounter medications on file as of 01/31/2016.       Review of Systems  Constitutional: Negative.   Respiratory: Negative.   Cardiovascular: Negative.   Genitourinary: Positive for difficulty urinating and dysuria.  Neurological: Negative.   Psychiatric/Behavioral: The patient is nervous/anxious.        Objective:   Physical Exam  Constitutional: He is oriented to person, place, and time. He appears well-developed and well-nourished.  Cardiovascular: Normal rate and regular rhythm.   Pulmonary/Chest: Effort normal and breath sounds normal.  Neurological: He is alert and oriented to person, place, and time.  Psychiatric: He has a normal mood and affect. His behavior is normal.   BP 138/88   Pulse 69   Temp 97.8 F (36.6 C) (Oral)   Ht 5' 9" (1.753 m)   Wt 185 lb 6.4 oz (84.1 kg)   BMI 27.38 kg/m         Assessment & Plan:  1. Hypothyroidism, unspecified type It has been over one year since we checked TSH - TSH  2. Hyperlipidemia, unspecified hyperlipidemia type He takes a   statin not been quite 1 year but will go ahead and check while he is here today - CMP14+EGFR - Lipid panel  3. Anxiety and depression Continue with psychiatry. She prescribes both mirtazapine and alprazolam.  Stephen M Miller MD  

## 2016-01-31 ENCOUNTER — Encounter: Payer: Self-pay | Admitting: Family Medicine

## 2016-01-31 ENCOUNTER — Ambulatory Visit (INDEPENDENT_AMBULATORY_CARE_PROVIDER_SITE_OTHER): Payer: Medicare Other | Admitting: Family Medicine

## 2016-01-31 VITALS — BP 138/88 | HR 69 | Temp 97.8°F | Ht 69.0 in | Wt 185.4 lb

## 2016-01-31 DIAGNOSIS — F419 Anxiety disorder, unspecified: Secondary | ICD-10-CM

## 2016-01-31 DIAGNOSIS — F418 Other specified anxiety disorders: Secondary | ICD-10-CM | POA: Diagnosis not present

## 2016-01-31 DIAGNOSIS — E785 Hyperlipidemia, unspecified: Secondary | ICD-10-CM

## 2016-01-31 DIAGNOSIS — E039 Hypothyroidism, unspecified: Secondary | ICD-10-CM

## 2016-01-31 DIAGNOSIS — F329 Major depressive disorder, single episode, unspecified: Secondary | ICD-10-CM

## 2016-02-01 LAB — CMP14+EGFR
A/G RATIO: 2 (ref 1.2–2.2)
ALK PHOS: 79 IU/L (ref 39–117)
ALT: 19 IU/L (ref 0–44)
AST: 22 IU/L (ref 0–40)
Albumin: 4.7 g/dL (ref 3.5–4.8)
BILIRUBIN TOTAL: 0.6 mg/dL (ref 0.0–1.2)
BUN/Creatinine Ratio: 16 (ref 10–24)
BUN: 13 mg/dL (ref 8–27)
CHLORIDE: 100 mmol/L (ref 96–106)
CO2: 27 mmol/L (ref 18–29)
Calcium: 9.1 mg/dL (ref 8.6–10.2)
Creatinine, Ser: 0.82 mg/dL (ref 0.76–1.27)
GFR calc Af Amer: 101 mL/min/{1.73_m2} (ref >59)
GFR calc non Af Amer: 87 mL/min/{1.73_m2} (ref >59)
Globulin, Total: 2.4 g/dL (ref 1.5–4.5)
Glucose: 103 mg/dL — ABNORMAL HIGH (ref 65–99)
POTASSIUM: 4 mmol/L (ref 3.5–5.2)
Sodium: 142 mmol/L (ref 134–144)
Total Protein: 7.1 g/dL (ref 6.0–8.5)

## 2016-02-01 LAB — LIPID PANEL
Chol/HDL Ratio: 3.6 ratio units (ref 0.0–5.0)
Cholesterol, Total: 145 mg/dL (ref 100–199)
HDL: 40 mg/dL (ref >39)
LDL Calculated: 79 mg/dL (ref 0–99)
TRIGLYCERIDES: 132 mg/dL (ref 0–149)
VLDL Cholesterol Cal: 26 mg/dL (ref 5–40)

## 2016-02-01 LAB — TSH: TSH: 1.83 u[IU]/mL (ref 0.450–4.500)

## 2016-02-04 ENCOUNTER — Ambulatory Visit (INDEPENDENT_AMBULATORY_CARE_PROVIDER_SITE_OTHER): Payer: Medicare Other | Admitting: Allergy and Immunology

## 2016-02-04 ENCOUNTER — Encounter: Payer: Self-pay | Admitting: Allergy and Immunology

## 2016-02-04 VITALS — BP 152/90 | HR 76 | Resp 20

## 2016-02-04 DIAGNOSIS — K219 Gastro-esophageal reflux disease without esophagitis: Secondary | ICD-10-CM | POA: Diagnosis not present

## 2016-02-04 DIAGNOSIS — J4530 Mild persistent asthma, uncomplicated: Secondary | ICD-10-CM | POA: Diagnosis not present

## 2016-02-04 DIAGNOSIS — J3089 Other allergic rhinitis: Secondary | ICD-10-CM | POA: Diagnosis not present

## 2016-02-04 DIAGNOSIS — J310 Chronic rhinitis: Secondary | ICD-10-CM | POA: Diagnosis not present

## 2016-02-04 DIAGNOSIS — T485X5A Adverse effect of other anti-common-cold drugs, initial encounter: Secondary | ICD-10-CM

## 2016-02-04 NOTE — Patient Instructions (Signed)
  1. Continue omeprazole 20 mg twice a day  2. Continue Flovent 110 two inhalations twice a day with spacer  3. Continue OTC Rhinocort one spray each nostril 3-7 times per week  4. Continue nasal saline wash, Systane eyedrops, Afrin, and ProAir or if needed  5. Return to clinic in 6 months or earlier if a problem   

## 2016-02-04 NOTE — Progress Notes (Signed)
Follow-up Note  Referring Provider: Wardell Honour, MD Primary Provider: Wardell Honour, MD Date of Office Visit: 02/04/2016  Subjective:   [name] (DOB: [birth year]) is a 75 y.o. male who returns to the [facility] on 02/04/2016 in re-evaluation of the following:  HPI: [name] returns to this clinic in reevaluation of his asthma and allergic rhinoconjunctivitis with a component of rhinitis medicamentosa and reflux-induced respiratory disease. I last saw him in this clinic in June 2017.  His respiratory tract has been doing wonderful. He has not required a systemic steroid or antibiotic to treat a problem with an asthma flare or sinusitis respectively. He can exert himself without any problem and rarely uses a short acting bronchodilator. In fact, he no longer has a short acting bronchodilator. When we last saw him in this clinic he was using Advair but because of the possibility of Advair contributing to some of his anxiety I placed him on Flovent as a replacement. His anxiety is much better at this point time but that may be because of the fact that he went to see a psychiatrist and is having appropriate therapy administered. His nose has not been bothering him. He still continues to use Afrin every night and uses an over-the-counter Rhinocort as well.  His reflux is doing quite well. He continues on omeprazole twice a day at this point.  He did receive the flu vaccine this year.    Medication List      ALPRAZolam 0.5 MG tablet Commonly known as:  XANAX TAKE TWO TABLETS BY MOUTH THREE TIMES DAILY (8AM, 2PM, AND 8PM)   aspirin 81 MG tablet Take 81 mg by mouth daily.   fluticasone 110 MCG/ACT inhaler Commonly known as:  FLOVENT HFA INHALE TWO PUFFS TWICE DAILY TO PREVENT COUGH OR WHEEZE. RINSE MOUTH AFTER USE.   levothyroxine 88 MCG tablet Commonly known as:  SYNTHROID, LEVOTHROID Take 1 tablet (88 mcg total) by mouth daily.   mirtazapine 15 MG  tablet Commonly known as:  REMERON Take 0.5 tablets by mouth at bedtime.   multivitamin tablet Take 1 tablet by mouth daily.   omeprazole 20 MG capsule Commonly known as:  PRILOSEC Take 1 capsule (20 mg total) by mouth 2 (two) times daily before a meal.   RHINOCORT AQUA NA Place 1 puff into the nose. Three times a week   simvastatin 20 MG tablet Commonly known as:  ZOCOR TAKE 1 TABLET (20 MG TOTAL) BY MOUTH AT BEDTIME.   Vitamin D 2000 units Caps Take 1 capsule by mouth daily.       Past Medical History:  Diagnosis Date  . Allergic rhinitis, cause unspecified   . Allergy    a lot seasonal allergies  . Anxiety   . Arthritis    degenerative arthritis of the neck  . Depression   . GERD (gastroesophageal reflux disease)   . Hyperlipidemia   . Hypothyroidism   . Irritable bowel syndrome   . Kidney stones    per pt, not sure if had kidney stones.  . Obesity   . Panic attacks 03/2015  . Shoulder pain, right   . Unspecified asthma(493.90)     Past Surgical History:  Procedure Laterality Date  . CATARACT EXTRACTION BILATERAL W/ ANTERIOR VITRECTOMY  2005  . ROTATOR CUFF REPAIR  2005   Left Shoulder  . TONSILLECTOMY    . TRANSURETHRAL RESECTION OF PROSTATE  2005  . VASECTOMY  Allergies  Allergen Reactions  . Celexa [Citalopram Hydrobromide]     Panic attacks  . Cymbalta [Duloxetine Hcl]     Panic / anxiety attacks  . Procardia [Nifedipine] Other (See Comments)    Weakness   . Wellbutrin [Bupropion] Anxiety    Panic attacks    Review of systems negative except as noted in HPI / PMHx or noted below:  Review of Systems  Constitutional: Negative.   HENT: Negative.   Eyes: Negative.   Respiratory: Negative.   Cardiovascular: Negative.   Gastrointestinal: Negative.   Genitourinary: Negative.   Musculoskeletal: Negative.   Skin: Negative.   Neurological: Negative.   Endo/Heme/Allergies: Negative.   Psychiatric/Behavioral: Negative.       Objective:   Vitals:   02/04/16 1350  BP: (!) 152/90  Pulse: 76  Resp: 20          Physical Exam  Constitutional: He is well-developed, well-nourished, and in no distress.  HENT:  Head: Normocephalic.  Right Ear: Tympanic membrane, external ear and ear canal normal.  Left Ear: Tympanic membrane, external ear and ear canal normal.  Nose: Nose normal. No mucosal edema or rhinorrhea.  Mouth/Throat: Uvula is midline, oropharynx is clear and moist and mucous membranes are normal. No oropharyngeal exudate.  Eyes: Conjunctivae are normal.  Neck: Trachea normal. No tracheal tenderness present. No tracheal deviation present. No thyromegaly present.  Cardiovascular: Normal rate, regular rhythm, S1 normal, S2 normal and normal heart sounds.   No murmur heard. Pulmonary/Chest: Breath sounds normal. No stridor. No respiratory distress. He has no wheezes. He has no rales.  Musculoskeletal: He exhibits no edema.  Lymphadenopathy:       Head (right side): No tonsillar adenopathy present.       Head (left side): No tonsillar adenopathy present.    He has no cervical adenopathy.  Neurological: He is alert. Gait normal.  Skin: No rash noted. He is not diaphoretic. No erythema. Nails show no clubbing.  Psychiatric: Mood and affect normal.    Diagnostics:    Spirometry was performed and demonstrated an FEV1 of 3.43 at 115 % of predicted.  Assessment and Plan:   1. Asthma, well controlled, mild persistent   2. Other allergic rhinitis   3. Rhinitis medicamentosa   4. LPRD (laryngopharyngeal reflux disease)     1. Continue omeprazole 20 mg twice a day  2. Continue Flovent 110 two inhalations twice a day with spacer  3. Continue OTC Rhinocort one spray each nostril 3-7 times per week  4. Continue nasal saline wash, Systane eyedrops, Afrin, and ProAir or if needed  5. Return to clinic in 6 months or earlier if a problem   [name] appears to be doing very well on his current  medical therapy. I've had a long talk with him in the past about the need to discontinue Afrin use at night but he's been stuck in this pattern for many years and I think that as long as we continue to have him use Rhinocort at the same point in time we should prevent the development of significant rebound phenomena. Flovent appears to be an adequate medication for his lower airway disease at this point in time. I will see him back in this clinic in 6 months or earlier if there is a problem.  Allena Katz, MD [place]

## 2016-03-05 DIAGNOSIS — R350 Frequency of micturition: Secondary | ICD-10-CM | POA: Diagnosis not present

## 2016-03-19 DIAGNOSIS — R350 Frequency of micturition: Secondary | ICD-10-CM | POA: Diagnosis not present

## 2016-03-23 ENCOUNTER — Other Ambulatory Visit: Payer: Self-pay | Admitting: Family Medicine

## 2016-03-26 DIAGNOSIS — R350 Frequency of micturition: Secondary | ICD-10-CM | POA: Diagnosis not present

## 2016-04-02 DIAGNOSIS — R350 Frequency of micturition: Secondary | ICD-10-CM | POA: Diagnosis not present

## 2016-04-09 DIAGNOSIS — R350 Frequency of micturition: Secondary | ICD-10-CM | POA: Diagnosis not present

## 2016-04-16 DIAGNOSIS — R350 Frequency of micturition: Secondary | ICD-10-CM | POA: Diagnosis not present

## 2016-04-16 DIAGNOSIS — N3281 Overactive bladder: Secondary | ICD-10-CM | POA: Diagnosis not present

## 2016-04-20 ENCOUNTER — Other Ambulatory Visit: Payer: Self-pay | Admitting: Allergy and Immunology

## 2016-04-23 DIAGNOSIS — R350 Frequency of micturition: Secondary | ICD-10-CM | POA: Diagnosis not present

## 2016-04-30 DIAGNOSIS — R350 Frequency of micturition: Secondary | ICD-10-CM | POA: Diagnosis not present

## 2016-05-07 DIAGNOSIS — R350 Frequency of micturition: Secondary | ICD-10-CM | POA: Diagnosis not present

## 2016-05-12 ENCOUNTER — Encounter: Payer: Self-pay | Admitting: Family Medicine

## 2016-05-12 ENCOUNTER — Ambulatory Visit (INDEPENDENT_AMBULATORY_CARE_PROVIDER_SITE_OTHER): Payer: Medicare Other | Admitting: Family Medicine

## 2016-05-12 VITALS — BP 133/87 | HR 67 | Temp 97.4°F | Ht 69.0 in | Wt 181.8 lb

## 2016-05-12 DIAGNOSIS — F329 Major depressive disorder, single episode, unspecified: Secondary | ICD-10-CM

## 2016-05-12 DIAGNOSIS — F418 Other specified anxiety disorders: Secondary | ICD-10-CM | POA: Diagnosis not present

## 2016-05-12 DIAGNOSIS — F419 Anxiety disorder, unspecified: Principal | ICD-10-CM

## 2016-05-12 NOTE — Progress Notes (Signed)
   Subjective:    Patient ID: [name], male    DOB: November 24, 1940, 76 y.o.   MRN: [id number]  HPI patient is doing much better on all areas. Depression and anxiety has improved on Remeron. He continues to see psychiatry. He also sees acupuncturist for his overactive bladder and his nocturia has decreased from 4521 or 2 times per night. Psychiatrist has asked that he have blood work today including vitamin D B12 and folate levels.  Patient Active Problem List   Diagnosis Date Noted  . Anxiety and depression 03/22/2015  . Raynaud's phenomenon (by history or observed) 03/22/2015  . LPRD (laryngopharyngeal reflux disease) 01/29/2015  . Rhinitis medicamentosa 01/29/2015  . Moderate persistent asthma 01/29/2015  . Incidental lung nodule, greater than or equal to 41mm 07/19/2014  . History of smoking for 1-2 years 07/19/2014  . Arthritis   . Cervical radiculitis 12/20/2012  . Degenerative arthritis of cervical spine 11/15/2012  . OAB (overactive bladder) 07/22/2012  . BPH (benign prostatic hyperplasia) 07/22/2012  . Hypothyroidism 07/22/2012  . HLD (hyperlipidemia) 07/22/2012  . Allergic rhinitis due to pollen 05/06/2010  . IRRITABLE BOWEL SYNDROME 10/05/2007   Outpatient Encounter Prescriptions as of 05/12/2016  Medication Sig  . ALPRAZolam (XANAX) 0.5 MG tablet TAKE TWO TABLETS BY MOUTH THREE TIMES DAILY (8AM, 2PM, AND 8PM)  . aspirin 81 MG tablet Take 81 mg by mouth daily.  . Budesonide (RHINOCORT AQUA NA) Place 1 puff into the nose. Three times a week  . Cholecalciferol (VITAMIN D) 2000 UNITS CAPS Take 1 capsule by mouth daily.    . fluticasone (FLOVENT HFA) 110 MCG/ACT inhaler INHALE TWO PUFFS TWICE DAILY TO PREVENT COUGH OR WHEEZE. RINSE MOUTH AFTER USE.  Marland Kitchen levothyroxine (SYNTHROID, LEVOTHROID) 88 MCG tablet TAKE 1 TABLET EVERY DAY  . mirtazapine (REMERON) 30 MG tablet Take 1 tablet by mouth daily.  . Multiple Vitamin (MULTIVITAMIN) tablet Take 1 tablet by mouth daily.    Marland Kitchen  omeprazole (PRILOSEC) 20 MG capsule TAKE 1 CAPSULE (20 MG TOTAL) BY MOUTH 2 (TWO) TIMES DAILY BEFORE A MEAL.  . simvastatin (ZOCOR) 20 MG tablet TAKE 1 TABLET (20 MG TOTAL) BY MOUTH AT BEDTIME.  . [DISCONTINUED] mirtazapine (REMERON) 15 MG tablet Take 0.5 tablets by mouth at bedtime.   No facility-administered encounter medications on file as of 05/12/2016.       Review of Systems  Psychiatric/Behavioral: Positive for agitation and suicidal ideas. The patient is nervous/anxious.        Objective:   Physical Exam  Constitutional: He is oriented to person, place, and time. He appears well-developed and well-nourished.  Cardiovascular: Normal rate, regular rhythm and normal heart sounds.   Pulmonary/Chest: Effort normal and breath sounds normal.  Neurological: He is alert and oriented to person, place, and time.  Psychiatric: He has a normal mood and affect. His behavior is normal.   BP 133/87   Pulse 67   Temp 97.4 F (36.3 C) (Oral)   Ht 5\' 9"  (1.753 m)   Wt 181 lb 12.8 oz (82.5 kg)   BMI 26.85 kg/m         Assessment & Plan:  1. Anxiety and depression Continue mirtazapine and when necessary Xanax and follow-up with psychiatry - Vitamin B12 - VITAMIN D 25 Hydroxy (Vit-D Deficiency, Fractures) - Folate  Wardell Honour MD

## 2016-05-13 LAB — FOLATE: Folate: >20 ng/mL (ref >3.0)

## 2016-05-13 LAB — VITAMIN D 25 HYDROXY (VIT D DEFICIENCY, FRACTURES): VIT D 25 HYDROXY: 51.4 ng/mL (ref 30.0–100.0)

## 2016-05-13 LAB — VITAMIN B12: VITAMIN B 12: 935 pg/mL (ref 232–1245)

## 2016-05-14 DIAGNOSIS — R350 Frequency of micturition: Secondary | ICD-10-CM | POA: Diagnosis not present

## 2016-05-18 ENCOUNTER — Other Ambulatory Visit: Payer: Self-pay | Admitting: Family Medicine

## 2016-05-21 DIAGNOSIS — R350 Frequency of micturition: Secondary | ICD-10-CM | POA: Diagnosis not present

## 2016-05-27 ENCOUNTER — Telehealth: Payer: Self-pay | Admitting: Allergy and Immunology

## 2016-05-27 NOTE — Telephone Encounter (Signed)
Patient informed of plan. He states he will add the Zantac and call us with an update if it does not work.

## 2016-05-27 NOTE — Telephone Encounter (Signed)
Can continue omeprazole two times a day and add OTC ranitidine 150 two tablets at bedtime. Id still with problems contact clinic.

## 2016-05-27 NOTE — Telephone Encounter (Signed)
Pt called and said that you had put him on omeprazole and it was working until about a week ago and he started waking up about a hour after going to bed and started coughing and he would have to get up and get some water. Don't know if need to double up or be put on something else. [phone]. [facility].

## 2016-05-28 ENCOUNTER — Ambulatory Visit (INDEPENDENT_AMBULATORY_CARE_PROVIDER_SITE_OTHER): Payer: Medicare Other | Admitting: Pediatrics

## 2016-05-28 ENCOUNTER — Encounter: Payer: Self-pay | Admitting: Pediatrics

## 2016-05-28 VITALS — BP 148/90 | HR 73 | Temp 97.3°F | Ht 69.0 in | Wt 184.6 lb

## 2016-05-28 DIAGNOSIS — J301 Allergic rhinitis due to pollen: Secondary | ICD-10-CM

## 2016-05-28 DIAGNOSIS — R350 Frequency of micturition: Secondary | ICD-10-CM | POA: Diagnosis not present

## 2016-05-28 DIAGNOSIS — W57XXXA Bitten or stung by nonvenomous insect and other nonvenomous arthropods, initial encounter: Secondary | ICD-10-CM

## 2016-05-28 DIAGNOSIS — K219 Gastro-esophageal reflux disease without esophagitis: Secondary | ICD-10-CM | POA: Diagnosis not present

## 2016-05-28 NOTE — Progress Notes (Signed)
  Subjective:   Patient ID: [name], male    DOB: 07-Nov-1940, 76 y.o.   MRN: [id number] CC: Tick Removal (left chest)  HPI: [name] is a 76 y.o. male presenting for Tick Removal (left chest)  6 days ago noticed black spot at night The next night noticed it was a tick Removed it, brought with him, all of tick was removed, mouth parts intact Was not engorged Not itchy No fevers, feeling well, normal appetite Some redness immediately around bite, less than 1 cm total diameter Some mottled redness in shape of bandaid has been there for past few days Less redness today than two days No bandaid for past 24h Was using abx ointment until a couple days ago  Does have seasonal allergies, has noticed more symptoms past few days, more sneezing, coughing some, non-productive Allergies always worse this time of year  Recently started on H2 blocker in addition to PPi for reflux from allergist             Relevant past medical, surgical, family and social history reviewed. Allergies and medications reviewed and updated. History  Smoking Status  . Former Smoker  . Packs/day: 2.00  . Years: 2.00  . Types: Cigarettes  . Quit date: 02/23/1969  Smokeless Tobacco  . Never Used   ROS: Per HPI   Objective:    BP (!) 148/90   Pulse 73   Temp 97.3 F (36.3 C) (Oral)   Ht 5\' 9"  (1.753 m)   Wt 184 lb 9.6 oz (83.7 kg)   BMI 27.26 kg/m   Wt Readings from Last 3 Encounters:  05/28/16 184 lb 9.6 oz (83.7 kg)  05/12/16 181 lb 12.8 oz (82.5 kg)  01/31/16 185 lb 6.4 oz (84.1 kg)    Gen: NAD, alert, cooperative with exam, NCAT EYES: EOMI, no conjunctival injection, or no icterus ENT:  TMs pearly gray b/l, OP without erythema LYMPH: no cervical LAD CV: NRRR, normal S1/S2, no murmur, distal pulses 2+ b/l Resp: CTABL, no wheezes, normal WOB Abd: +BS, soft, NTND. Ext: No edema, warm Neuro: Alert and oriented Skin: R mid chest with 28mm scabbed slightly eroded lesion with <1cm of  surrounding redness, slight induration. 4 cm by 2 cm of mottled redness in shape of bandaid around tick bite  Assessment & Plan:  Pao was seen today for tick removal.  Diagnoses and all orders for this visit:  Tick bite, initial encounter No systemic symptoms Tick removed 5 days ago Minimal induration around bite and contact dermatitis from bandaid, otherwise no rashes Discussed return precautions, wound care. Healing well. Avoid bandaids repeatedly to area  Gastroesophageal reflux disease, esophagitis presence not specified Cont PPI/H2 blocker. Recently added h2 blocker  Allergic rhinitis due to pollen, unspecified chronicity, unspecified seasonality Worsening symptoms this time of year Discussed antihistamine use, flonase prn Cont flovent inhaler Breathing has been fine  Follow up plan: prn Assunta Found, MD [place]

## 2016-05-28 NOTE — Patient Instructions (Addendum)
Can try cetirizine or zyrtec for allergies  Tick Bite Information Introduction Ticks are insects that attach themselves to the skin. There are many types of ticks. Common types include wood ticks and deer ticks. Sometimes, ticks carry diseases that can make a person very ill. The most common places for ticks to attach themselves are the scalp, neck, armpits, waist, and groin. HOW CAN YOU PREVENT TICK BITES? Take these steps to help prevent tick bites when you are outdoors:  Wear long sleeves and long pants.  Wear white clothes so you can see ticks more easily.  Tuck your pant legs into your socks.  If walking on a trail, stay in the middle of the trail to avoid brushing against bushes.  Avoid walking through areas with long grass.  Put bug spray on all skin that is showing and along boot tops, pant legs, and sleeve cuffs.  Check clothes, hair, and skin often and before going inside.  Brush off any ticks that are not attached.  Take a shower or bath as soon as possible after being outdoors. HOW SHOULD YOU REMOVE A TICK? Ticks should be removed as soon as possible to help prevent diseases. 1. If latex gloves are available, put them on before trying to remove a tick. 2. Use tweezers to grasp the tick as close to the skin as possible. You may also use curved forceps or a tick removal tool. Grasp the tick as close to its head as possible. Avoid grasping the tick on its body. 3. Pull gently upward until the tick lets go. Do not twist the tick or jerk it suddenly. This may break off the tick's head or mouth parts. 4. Do not squeeze or crush the tick's body. This could force disease-carrying fluids from the tick into your body. 5. After the tick is removed, wash the bite area and your hands with soap and water or alcohol. 6. Apply a small amount of antiseptic cream or ointment to the bite site. 7. Wash any tools that were used. Do not try to remove a tick by applying a hot match, petroleum  jelly, or fingernail polish to the tick. These methods do not work. They may also increase the chances of disease being spread from the tick bite. WHEN SHOULD YOU SEEK HELP? Contact your health care provider if you are unable to remove a tick or if a part of the tick breaks off in the skin. After a tick bite, you need to watch for signs and symptoms of diseases that can be spread by ticks. Contact your health care provider if you develop any of the following:  Fever.  Rash.  Redness and puffiness (swelling) in the area of the tick bite.  Tender, puffy lymph glands.  Watery poop (diarrhea).  Weight loss.  Cough.  Feeling more tired than normal (fatigue).  Muscle, joint, or bone pain.  Belly (abdominal) pain.  Headache.  Change in your level of consciousness.  Trouble walking or moving your legs.  Loss of feeling (numbness) in the legs.  Loss of movement (paralysis).  Shortness of breath.  Confusion.  Throwing up (vomiting) many times. This information is not intended to replace advice given to you by your health care provider. Make sure you discuss any questions you have with your health care provider. Document Released: 05/06/2009 Document Revised: 07/18/2015 Document Reviewed: 07/20/2012 Elsevier Interactive Patient Education  2017 [employer].

## 2016-06-15 ENCOUNTER — Other Ambulatory Visit: Payer: Self-pay | Admitting: Allergy and Immunology

## 2016-06-16 ENCOUNTER — Other Ambulatory Visit: Payer: Self-pay | Admitting: Allergy and Immunology

## 2016-06-18 ENCOUNTER — Telehealth: Payer: Self-pay | Admitting: Allergy and Immunology

## 2016-06-18 DIAGNOSIS — R350 Frequency of micturition: Secondary | ICD-10-CM | POA: Diagnosis not present

## 2016-06-18 NOTE — Telephone Encounter (Signed)
Patient wants to know the status on his OMEPRAZOLE , his FLOVENT got its approval - when will he know about the other medication?

## 2016-06-19 NOTE — Telephone Encounter (Signed)
Patient no longer needs refills. They were already filled.

## 2016-07-09 DIAGNOSIS — R350 Frequency of micturition: Secondary | ICD-10-CM | POA: Diagnosis not present

## 2016-07-30 DIAGNOSIS — R350 Frequency of micturition: Secondary | ICD-10-CM | POA: Diagnosis not present

## 2016-08-04 ENCOUNTER — Encounter: Payer: Self-pay | Admitting: Allergy and Immunology

## 2016-08-04 ENCOUNTER — Ambulatory Visit (INDEPENDENT_AMBULATORY_CARE_PROVIDER_SITE_OTHER): Payer: Medicare Other | Admitting: Allergy and Immunology

## 2016-08-04 VITALS — BP 160/92 | HR 68 | Resp 16

## 2016-08-04 DIAGNOSIS — J3089 Other allergic rhinitis: Secondary | ICD-10-CM | POA: Diagnosis not present

## 2016-08-04 DIAGNOSIS — J4530 Mild persistent asthma, uncomplicated: Secondary | ICD-10-CM

## 2016-08-04 DIAGNOSIS — K219 Gastro-esophageal reflux disease without esophagitis: Secondary | ICD-10-CM

## 2016-08-04 NOTE — Patient Instructions (Signed)
  1. Continue omeprazole 20 mg twice a day  2. Continue Flovent 110 two inhalations twice a day with spacer  3. Continue OTC Rhinocort one spray each nostril 3-7 times per week  4. Continue nasal saline wash, Systane eyedrops, Afrin, and ProAir or if needed  5. Return to clinic in 6 months or earlier if a problem

## 2016-08-04 NOTE — Progress Notes (Signed)
Follow-up Note  Referring Provider: Wardell Honour, MD Primary Provider: Wardell Honour, MD Date of Office Visit: 08/04/2016  Subjective:   [name] (DOB: June 04, 1940) is a 76 y.o. male who returns to the [facility] [place] on 08/04/2016 in re-evaluation of the following:  HPI: [name] returns to this clinic in reevaluation of his asthma and allergic rhinoconjunctivitis and reflux-induced respiratory disease. His last visit to this clinic was December 2017.  Overall he has done quite well with his respiratory tract and has not required administration of a systemic steroid or antibiotic to treat a respiratory tract issue and rarely uses a short acting bronchodilator and exercises on the Nord[place] track for 60 minutes without a problem. On occasion, on a very hot and humid day, he does feel little bit weak and exhausted but otherwise has really done very well.  He continues to use Afrin at nighttime as well as Rhinocort. Afrin is a consistent issue every night but there is no use during the daytime. Rhinocort is administrator about 3 times per week.  Reflux is under excellent control at this point in time while continuing to use omeprazole twice a day. He does have ranitidine at home to add should he develop a significant problem but that is very rare.  Allergies as of 08/04/2016      Reactions   Celexa [citalopram Hydrobromide]    Panic attacks   Cymbalta [duloxetine Hcl]    Panic / anxiety attacks   Procardia [nifedipine] Other (See Comments)   Weakness   Wellbutrin [bupropion] Anxiety   Panic attacks      Medication List      ALPRAZolam 0.5 MG tablet Commonly known as:  XANAX TAKE TWO TABLETS BY MOUTH THREE TIMES DAILY (8AM, 2PM, AND 8PM)   aspirin 81 MG tablet Take 81 mg by mouth daily.   FLOVENT HFA 110 MCG/ACT inhaler Generic drug:  fluticasone INHALE 2 PUFFS TWICE DAILY TO PREVENT COUGH AND WHEEZE--RINSE MOUTH AFTER USE   levothyroxine 88 MCG  tablet Commonly known as:  SYNTHROID, LEVOTHROID TAKE 1 TABLET EVERY DAY   mirtazapine 30 MG tablet Commonly known as:  REMERON Take 1 tablet by mouth daily.   multivitamin tablet Take 1 tablet by mouth daily.   omeprazole 20 MG capsule Commonly known as:  PRILOSEC TAKE 1 CAPSULE (20 MG TOTAL) BY MOUTH 2 (TWO) TIMES DAILY BEFORE A MEAL.   RHINOCORT AQUA NA Place 1 puff into the nose. Three times a week   simvastatin 20 MG tablet Commonly known as:  ZOCOR TAKE 1 TABLET BY MOUTH AT BEDTIME   Vitamin D 2000 units Caps Take 1 capsule by mouth daily.       Past Medical History:  Diagnosis Date  . Allergic rhinitis, cause unspecified   . Allergy    a lot seasonal allergies  . Anxiety   . Arthritis    degenerative arthritis of the neck  . Depression   . GERD (gastroesophageal reflux disease)   . Hyperlipidemia   . Hypothyroidism   . Irritable bowel syndrome   . Kidney stones    per pt, not sure if had kidney stones.  . Obesity   . Panic attacks 03/2015  . Shoulder pain, right   . Unspecified asthma(493.90)     Past Surgical History:  Procedure Laterality Date  . CATARACT EXTRACTION BILATERAL W/ ANTERIOR VITRECTOMY  2005  . ROTATOR CUFF REPAIR  2005   Left Shoulder  . TONSILLECTOMY    .  TRANSURETHRAL RESECTION OF PROSTATE  2005  . VASECTOMY      Review of systems negative except as noted in HPI / PMHx or noted below:  Review of Systems  Constitutional: Negative.   HENT: Negative.   Eyes: Negative.   Respiratory: Negative.   Cardiovascular: Negative.   Gastrointestinal: Negative.   Genitourinary: Negative.   Musculoskeletal: Negative.   Skin: Negative.   Neurological: Negative.   Endo/Heme/Allergies: Negative.   Psychiatric/Behavioral: Negative.      Objective:   Vitals:   08/04/16 1347  BP: (!) 160/92  Pulse: 68  Resp: 16          Physical Exam  Constitutional: He is well-developed, well-nourished, and in no distress.  HENT:  Head:  Normocephalic.  Right Ear: Tympanic membrane, external ear and ear canal normal.  Left Ear: Tympanic membrane, external ear and ear canal normal.  Nose: Nose normal. No mucosal edema or rhinorrhea.  Mouth/Throat: Uvula is midline, oropharynx is clear and moist and mucous membranes are normal. No oropharyngeal exudate.  Eyes: Conjunctivae are normal.  Neck: Trachea normal. No tracheal tenderness present. No tracheal deviation present. No thyromegaly present.  Cardiovascular: Normal rate, regular rhythm, S1 normal, S2 normal and normal heart sounds.   No murmur heard. Pulmonary/Chest: Breath sounds normal. No stridor. No respiratory distress. He has no wheezes. He has no rales.  Musculoskeletal: He exhibits no edema.  Lymphadenopathy:       Head (right side): No tonsillar adenopathy present.       Head (left side): No tonsillar adenopathy present.    He has no cervical adenopathy.  Neurological: He is alert. Gait normal.  Skin: No rash noted. He is not diaphoretic. No erythema. Nails show no clubbing.  Psychiatric: Mood and affect normal.    Diagnostics:    Spirometry was performed and demonstrated an FEV1 of 3.19 at 107 % of predicted.  The patient had an Asthma Control Test with the following results: ACT Total Score: 23.    Assessment and Plan:   1. Asthma, well controlled, mild persistent   2. Other allergic rhinitis   3. LPRD (laryngopharyngeal reflux disease)     1. Continue omeprazole 20 mg twice a day  2. Continue Flovent 110 two inhalations twice a day with spacer  3. Continue OTC Rhinocort one spray each nostril 3-7 times per week  4. Continue nasal saline wash, Systane eyedrops, Afrin, and ProAir or if needed  5. Return to clinic in 6 months or earlier if a problem   Basilar appears to be doing very well at this point and he will continue to use anti-inflammatory agents for his respiratory tract as noted above as well as therapy directed against reflux. He does  consistently use Afrin at night time and it does appear as though the administration of a nasal steroid has prevented the development of any significant rebound phenomena. This prevention is probably also a feature of the fact that he does never uses his Afrin twice a day and only utilizes this nasal decongestant one time per day at night. If he does well I will see him back in this clinic in 6 months or earlier if there is a problem.  Allena Katz, MD Allergy / Immunology [place]

## 2016-08-12 ENCOUNTER — Ambulatory Visit (INDEPENDENT_AMBULATORY_CARE_PROVIDER_SITE_OTHER): Payer: Medicare Other | Admitting: Family Medicine

## 2016-08-12 ENCOUNTER — Encounter: Payer: Self-pay | Admitting: Family Medicine

## 2016-08-12 VITALS — BP 126/78 | HR 74 | Temp 97.6°F | Ht 69.0 in | Wt 178.4 lb

## 2016-08-12 DIAGNOSIS — F419 Anxiety disorder, unspecified: Secondary | ICD-10-CM | POA: Diagnosis not present

## 2016-08-12 DIAGNOSIS — E785 Hyperlipidemia, unspecified: Secondary | ICD-10-CM | POA: Diagnosis not present

## 2016-08-12 DIAGNOSIS — K219 Gastro-esophageal reflux disease without esophagitis: Secondary | ICD-10-CM | POA: Diagnosis not present

## 2016-08-12 DIAGNOSIS — F329 Major depressive disorder, single episode, unspecified: Secondary | ICD-10-CM | POA: Diagnosis not present

## 2016-08-12 MED ORDER — SIMVASTATIN 20 MG PO TABS: 20.0000 mg | ORAL_TABLET | Freq: Every day | ORAL | 3 refills | Status: DC

## 2016-08-12 NOTE — Progress Notes (Signed)
   HPI  Patient presents today here to follow-up for chronic medical conditions, patient is establishing care as well, his previous provider has retired.  GERD Doing very well with PPI, previously had nighttime cough on a daily basis, he had immediate improvement after starting medication.  Hyperlipidemia Good medication compliance, no side effects. Needs refill.  Depression Severe Has well established relationship with psychiatry Has passive SI without plan or intent  PMH: Smoking status noted ROS: Per HPI  Objective: BP 126/78   Pulse 74   Temp 97.6 F (36.4 C) (Oral)   Ht 5\' 9"  (1.753 m)   Wt 178 lb 6.4 oz (80.9 kg)   BMI 26.35 kg/m  Gen: NAD, alert, cooperative with exam HEENT: NCAT CV: RRR, good S1/S2, no murmur Resp: CTABL, no wheezes, non-labored Abd: SNTND, BS present, no guarding or organomegaly Ext: No edema, warm Neuro: Alert and oriented, 1+ symmetric patellar tendon reflexes bilaterally  Assessment and plan:  # GERD Doing well PPI No changes  # Hyperlipidemia Labs up-to-date and well controlled Continue simvastatin Refill  # Anxiety and depression Complex, patient with well established relationship with psychiatry and feels that he is getting very good care, most of his symptoms seem to be coming from his loss of his wife 6 years ago which we discussed at length.   Meds ordered this encounter  Medications  . simvastatin (ZOCOR) 20 MG tablet    Sig: Take 1 tablet (20 mg total) by mouth at bedtime.    Dispense:  90 tablet    Refill:  [place], MD [facility] 08/12/2016, 2:42 PM

## 2016-09-03 DIAGNOSIS — R350 Frequency of micturition: Secondary | ICD-10-CM | POA: Diagnosis not present

## 2016-09-10 DIAGNOSIS — H524 Presbyopia: Secondary | ICD-10-CM | POA: Diagnosis not present

## 2016-09-10 DIAGNOSIS — H26492 Other secondary cataract, left eye: Secondary | ICD-10-CM | POA: Diagnosis not present

## 2016-09-10 DIAGNOSIS — Z961 Presence of intraocular lens: Secondary | ICD-10-CM | POA: Diagnosis not present

## 2016-09-14 ENCOUNTER — Other Ambulatory Visit: Payer: Self-pay | Admitting: Allergy and Immunology

## 2016-09-24 DIAGNOSIS — R350 Frequency of micturition: Secondary | ICD-10-CM | POA: Diagnosis not present

## 2016-10-09 ENCOUNTER — Ambulatory Visit (INDEPENDENT_AMBULATORY_CARE_PROVIDER_SITE_OTHER): Payer: Medicare Other | Admitting: Allergy

## 2016-10-09 ENCOUNTER — Encounter: Payer: Self-pay | Admitting: Allergy

## 2016-10-09 VITALS — BP 122/76 | HR 86 | Resp 18

## 2016-10-09 DIAGNOSIS — K219 Gastro-esophageal reflux disease without esophagitis: Secondary | ICD-10-CM | POA: Diagnosis not present

## 2016-10-09 DIAGNOSIS — J4531 Mild persistent asthma with (acute) exacerbation: Secondary | ICD-10-CM

## 2016-10-09 DIAGNOSIS — J3089 Other allergic rhinitis: Secondary | ICD-10-CM | POA: Diagnosis not present

## 2016-10-09 MED ORDER — AZITHROMYCIN 250 MG PO TABS: ORAL_TABLET | ORAL | 0 refills | Status: DC

## 2016-10-09 MED ORDER — METHYLPREDNISOLONE ACETATE 80 MG/ML IJ SUSP
80.0000 mg | Freq: Once | INTRAMUSCULAR | Status: AC
  Administered 2016-10-09: 80 mg via INTRAMUSCULAR

## 2016-10-09 NOTE — Progress Notes (Signed)
Follow-up Note  RE: [name] MRN: [id number] DOB: 1940/06/14 Date of Office Visit: 10/09/2016   History of present illness: [name] is a 76 y.o. male presenting today for sick visit.  He was last seen in the office on 08/04/16 by Dr. Neldon Mc for routine visit for asthma, allergic rhinitis and LPRD.  He presents today as he is not feeling well.  He states in the past he has been told that his asthma flares presents differently than with respiratory symptoms.  Since Tuesday he states he has been feeling exhausted and "rotten".  He is having very poor sleep and states he has been waking up almost hourly due to overactive bladder.  He also states he has been sweating at night.  He denies any wheezing, SOB or chest tightness.  He does however have a cough that he states has been presents since Tuesday.  He denies any frank fevers and no increase in nasal/sinus symptoms.  He states in the past when he has felt like this his previous asthma specialist have treated him with nebulizer treatments and steroids which usually makes him feel better rather quickly.  He states that albuterol has never been effective for him thus he does not use this.  He does continue on his Flovent 2 puffs twice a day.  He also continues his routine medication of omeprazole, rhinocort (3 times a week due to hx of cataract), systane eye drops, afrins as needed and nasal saline as needed.    Review of systems: Review of Systems  Constitutional: Positive for diaphoresis and malaise/fatigue. Negative for chills, fever and weight loss.  HENT: Negative for congestion, ear discharge, ear pain, nosebleeds, sinus pain and sore throat.   Eyes: Negative for discharge and redness.  Respiratory: Positive for cough. Negative for sputum production, shortness of breath and wheezing.   Cardiovascular: Negative for chest pain.  Gastrointestinal: Negative for abdominal pain, constipation, diarrhea, heartburn, nausea and vomiting.    Genitourinary: Positive for frequency.  Musculoskeletal: Negative for myalgias.  Skin: Negative for itching and rash.  Neurological: Negative for headaches.    All other systems negative unless noted above in HPI  Past medical/social/surgical/family history have been reviewed and are unchanged unless specifically indicated below.  No changes  Medication List: Allergies as of 10/09/2016      Reactions   Celexa [citalopram Hydrobromide]    Panic attacks   Cymbalta [duloxetine Hcl]    Panic / anxiety attacks   Procardia [nifedipine] Other (See Comments)   Weakness   Wellbutrin [bupropion] Anxiety   Panic attacks      Medication List       Accurate as of 10/09/16  4:13 PM. Always use your most recent med list.          ALPRAZolam 0.5 MG tablet Commonly known as:  XANAX TAKE TWO TABLETS BY MOUTH THREE TIMES DAILY (8AM, 2PM, AND 8PM)   aspirin 81 MG tablet Take 81 mg by mouth daily.   azithromycin 250 MG tablet Commonly known as:  ZITHROMAX Z-PAK Take two 250 mg tablets on first day and one 250 mg tablet on days 2 through 5.   FLOVENT HFA 110 MCG/ACT inhaler Generic drug:  fluticasone INHALE 2 PUFFS TWICE DAILY TO PREVENT COUGH AND WHEEZE--RINSE MOUTH AFTER USE   levothyroxine 88 MCG tablet Commonly known as:  SYNTHROID, LEVOTHROID TAKE 1 TABLET EVERY DAY   mirtazapine 30 MG tablet Commonly known as:  REMERON Take 1 tablet by mouth  daily.   multivitamin tablet Take 1 tablet by mouth daily.   omeprazole 20 MG capsule Commonly known as:  PRILOSEC TAKE 1 CAPSULE (20 MG TOTAL) BY MOUTH 2 (TWO) TIMES DAILY BEFORE A MEAL.   RHINOCORT AQUA NA Place 1 puff into the nose. Three times a week   simvastatin 20 MG tablet Commonly known as:  ZOCOR Take 1 tablet (20 mg total) by mouth at bedtime.   Vitamin D 2000 units Caps Take 1 capsule by mouth daily.       Known medication allergies: Allergies  Allergen Reactions  . Celexa [Citalopram Hydrobromide]      Panic attacks  . Cymbalta [Duloxetine Hcl]     Panic / anxiety attacks  . Procardia [Nifedipine] Other (See Comments)    Weakness   . Wellbutrin [Bupropion] Anxiety    Panic attacks     Physical examination: Blood pressure 122/76, pulse 86, resp. rate 18, SpO2 93 %.  General: Alert, interactive, in no acute distress. HEENT: PERRLA, TMs pearly gray, turbinates mildly edematous with clear discharge, post-pharynx non erythematous. Neck: Supple without lymphadenopathy. Lungs: Clear to auscultation without wheezing, rhonchi or rales. {no increased work of breathing. Dry cough throughout visit which decreased after duoneb treatment CV: Normal S1, S2 without murmurs. Abdomen: Nondistended, nontender. Skin: Warm and dry, without lesions or rashes. Extremities:  No clubbing, cyanosis or edema. Neuro:   Grossly intact.  Diagnositics/Labs:  Spirometry: FEV1: 3.07L  105%, FVC: 3.97L  106%, ratio consistent with nonbstructive pattern  Assessment and plan:   Asthma exacerbation likely 2/2 viral illness Allergic rhinitis LPRD  Provided with depomedrol 80mg  steroid injection today  Advised if symptoms worsen over the next 2-3 days following steroid injection may have a bacterial respiratory infection and he is to fill and complete prescription for Zpak provided today in office.     Continue omeprazole 20 mg twice a day  Continue Flovent 110 two inhalations twice a day with spacer  Continue OTC Rhinocort one spray each nostril 3-7 times per week  Continue nasal saline wash, Systane eyedrops, Afrin, and ProAir(albuterol) or if needed  Return to clinic in 6 months or earlier if a problem   I appreciate the opportunity to take part in [name]'s care. Please do not hesitate to contact me with questions.  Sincerely,   Prudy Feeler, MD Allergy/Immunology [facility]

## 2016-10-09 NOTE — Patient Instructions (Addendum)
Asthma exacerbation  Provided with steroid injection today  If your symptoms worsen over the next 2-3 days you may have a bacterial respiratory infection and would ask you fill the antibiotic prescription.    Continue omeprazole 20 mg twice a day  Continue Flovent 110 two inhalations twice a day with spacer  Continue OTC Rhinocort one spray each nostril 3-7 times per week  Continue nasal saline wash, Systane eyedrops, Afrin, and ProAir(albuterol) or if needed  Return to clinic in 6 months or earlier if a problem

## 2016-10-12 ENCOUNTER — Telehealth: Payer: Self-pay | Admitting: Family Medicine

## 2016-10-12 NOTE — Telephone Encounter (Signed)
aware

## 2016-10-12 NOTE — Telephone Encounter (Signed)
Yes asa can, but more in children, advil and aleve and ibuprofen will not

## 2016-10-12 NOTE — Telephone Encounter (Signed)
[name] pt - can you address

## 2016-10-13 ENCOUNTER — Encounter: Payer: Self-pay | Admitting: Family Medicine

## 2016-10-13 ENCOUNTER — Ambulatory Visit (INDEPENDENT_AMBULATORY_CARE_PROVIDER_SITE_OTHER): Payer: Medicare Other | Admitting: Family Medicine

## 2016-10-13 ENCOUNTER — Ambulatory Visit (INDEPENDENT_AMBULATORY_CARE_PROVIDER_SITE_OTHER): Payer: Medicare Other

## 2016-10-13 VITALS — BP 123/77 | HR 99 | Temp 99.0°F | Ht 69.0 in | Wt 177.0 lb

## 2016-10-13 DIAGNOSIS — R05 Cough: Secondary | ICD-10-CM

## 2016-10-13 DIAGNOSIS — R059 Cough, unspecified: Secondary | ICD-10-CM

## 2016-10-13 DIAGNOSIS — R509 Fever, unspecified: Secondary | ICD-10-CM

## 2016-10-13 DIAGNOSIS — R6889 Other general symptoms and signs: Secondary | ICD-10-CM | POA: Diagnosis not present

## 2016-10-13 LAB — VERITOR FLU A/B WAIVED
INFLUENZA A: NEGATIVE
INFLUENZA B: NEGATIVE

## 2016-10-13 MED ORDER — DOXYCYCLINE HYCLATE 100 MG PO TABS: 100.0000 mg | ORAL_TABLET | Freq: Two times a day (BID) | ORAL | 0 refills | Status: DC

## 2016-10-13 NOTE — Patient Instructions (Signed)
Great to see you!  I would recommend starting doxycyline today for your cough. Continue albuterol as needed  We will call with lab results or send a message on mychart within 1 week.

## 2016-10-13 NOTE — Progress Notes (Addendum)
   HPI  Patient presents today here with cough and fever.  Patient is here for 6 symptoms. Everything began with fatigue. About 4 days ago he developed cough and over the weekend he had fever measured as high as 1-2.6. He reports history of asthma however his breathing is pretty good except for his cough currently  He denies difficulty tolerating foods or fluids. Overall his breathing is pretty good.  He had one tick bite several months ago and had another take removed from his right inner thigh today.  Pt did not take recent azithro course.    PMH: Smoking status noted ROS: Per HPI  Objective: BP 123/77   Pulse 99   Temp 99 F (37.2 C) (Oral)   Ht 5\' 9"  (1.753 m)   Wt 177 lb (80.3 kg)   BMI 26.14 kg/m  Gen: NAD, alert, cooperative with exam HEENT: NCAT, oropharynx moist and clear CV: RRR, good S1/S2, no murmur Resp: Nonlabored, no wheezes, soft crackles and rhonchi at bilateral bases Ext: No edema, warm Neuro: Alert and oriented, No gross deficits Skin Right inner thigh with approximately 1 cm area of induration with small punctate lesion centrally located Patient also shows me the tic that was removed this morning which appears to be an immature dog tick.  Assessment and plan:  # Cough, fever Given age and fever over the weekend with primary symptom of cough I have decided to go ahead and treat him empirically for pneumonia with doxycycline Lung exam is reassuring but he does have some added sounds Plain film is pending. I do not believe he has any serious take hormone illness concerns, however he brings it up multiple times so I have proceeded with checking this as well.   Patient believed that he had the flu, however rapid flu is negative today and we are far outside of flu season currently.     Orders Placed This Encounter  Procedures  . DG Chest 2 View    Standing Status:   Future    Number of Occurrences:   1    Standing Expiration Date:   12/13/2017   Order Specific Question:   Reason for Exam (SYMPTOM  OR DIAGNOSIS REQUIRED)    Answer:   cough    Order Specific Question:   Preferred imaging location?    Answer:   Internal    Order Specific Question:   Radiology Contrast Protocol - do NOT remove file path    Answer:   \\[email or web address].pdf  . Veritor Flu A/B Waived    Order Specific Question:   Source    Answer:   nasal  . CBC with Differential/Platelet  . Lyme Ab/Western Blot Reflex  . Rocky mtn spotted fvr abs pnl(IgG+IgM)    Meds ordered this encounter  Medications  . doxycycline (VIBRA-TABS) 100 MG tablet    Sig: Take 1 tablet (100 mg total) by mouth 2 (two) times daily. 1 po bid    Dispense:  20 tablet    Refill:  0    Laroy Apple, MD [facility] 10/13/2016, 4:21 PM

## 2016-10-15 LAB — CBC WITH DIFFERENTIAL/PLATELET
BASOS: 0 %
Basophils Absolute: 0 10*3/uL (ref 0.0–0.2)
EOS (ABSOLUTE): 0.1 10*3/uL (ref 0.0–0.4)
EOS: 1 %
Hematocrit: 36.4 % — ABNORMAL LOW (ref 37.5–51.0)
Hemoglobin: 12.7 g/dL — ABNORMAL LOW (ref 13.0–17.7)
IMMATURE GRANULOCYTES: 1 %
Immature Grans (Abs): 0.1 10*3/uL (ref 0.0–0.1)
LYMPHS ABS: 2.2 10*3/uL (ref 0.7–3.1)
Lymphs: 19 %
MCH: 30.8 pg (ref 26.6–33.0)
MCHC: 34.9 g/dL (ref 31.5–35.7)
MCV: 88 fL (ref 79–97)
MONOS ABS: 1.3 10*3/uL — AB (ref 0.1–0.9)
Monocytes: 11 %
NEUTROS PCT: 68 %
Neutrophils Absolute: 7.9 10*3/uL — ABNORMAL HIGH (ref 1.4–7.0)
PLATELETS: 347 10*3/uL (ref 150–379)
RBC: 4.12 x10E6/uL — ABNORMAL LOW (ref 4.14–5.80)
RDW: 13.2 % (ref 12.3–15.4)
WBC: 11.4 10*3/uL — ABNORMAL HIGH (ref 3.4–10.8)

## 2016-10-15 LAB — RMSF, IGG, IFA: RMSF, IGG, IFA: <1:64 {titer}

## 2016-10-15 LAB — ROCKY MTN SPOTTED FVR ABS PNL(IGG+IGM)
RMSF IGG: POSITIVE — AB
RMSF IgM: 0.34 index (ref 0.00–0.89)

## 2016-10-15 LAB — LYME AB/WESTERN BLOT REFLEX
LYME DISEASE AB, QUANT, IGM: <0.8 index (ref 0.00–0.79)
Lyme IgG/IgM Ab: <0.91 {ISR} (ref 0.00–0.90)

## 2016-10-22 DIAGNOSIS — R350 Frequency of micturition: Secondary | ICD-10-CM | POA: Diagnosis not present

## 2016-11-12 ENCOUNTER — Ambulatory Visit (INDEPENDENT_AMBULATORY_CARE_PROVIDER_SITE_OTHER): Payer: Medicare Other | Admitting: Family Medicine

## 2016-11-12 ENCOUNTER — Encounter: Payer: Self-pay | Admitting: Family Medicine

## 2016-11-12 ENCOUNTER — Ambulatory Visit (INDEPENDENT_AMBULATORY_CARE_PROVIDER_SITE_OTHER): Payer: Medicare Other

## 2016-11-12 VITALS — BP 120/77 | HR 76 | Temp 97.9°F | Ht 69.0 in | Wt 177.4 lb

## 2016-11-12 DIAGNOSIS — J181 Lobar pneumonia, unspecified organism: Secondary | ICD-10-CM

## 2016-11-12 DIAGNOSIS — J189 Pneumonia, unspecified organism: Secondary | ICD-10-CM

## 2016-11-12 NOTE — Progress Notes (Signed)
   HPI  Patient presents today here to follow-up for pneumonia.  Patient was seen last month and found to have right upper lobe pneumonia. He was treated with doxycycline, he did have concern for possible RMSF at the same time. Labs show evidence of past infection of RMSF, however no current infection.  Patient feels much better, he states he feels back to normal.  He states that there was some concern previously about a pulmonary mass so repeat chest x-ray was pursued today.   PMH: Smoking status noted ROS: Per HPI  Objective: BP 120/77   Pulse 76   Temp 97.9 F (36.6 C) (Oral)   Ht 5\' 9"  (1.753 m)   Wt 177 lb 6.4 oz (80.5 kg)   BMI 26.20 kg/m  Gen: NAD, alert, cooperative with exam HEENT: NCAT CV: RRR, good S1/S2, no murmur Resp: CTABL, no wheezes, non-labored Ext: No edema, warm Neuro: Alert and oriented, No gross deficits  Assessment and plan:  # Pneumonia Symptomatically resolved, chest x-ray appears clear today Radiology read pending    Orders Placed This Encounter  Procedures  . DG Chest 2 View    Standing Status:   Future    Number of Occurrences:   1    Standing Expiration Date:   01/12/2018    Order Specific Question:   Reason for Exam (SYMPTOM  OR DIAGNOSIS REQUIRED)    Answer:   re check- pneumonia    Order Specific Question:   Preferred imaging location?    Answer:   Internal    Order Specific Question:   Radiology Contrast Protocol - do NOT remove file path    Answer:   \\[email or web address]    Meds ordered this encounter  Medications  . mirtazapine (REMERON) 45 MG tablet    Sig: Take 45 mg by mouth at bedtime.    Refill:  12  . chlordiazePOXIDE (LIBRIUM) 5 MG capsule    Sig: Take 5 mg by mouth at bedtime.    Refill:  [place], MD [facility] 11/12/2016, 1:58 PM

## 2016-11-13 ENCOUNTER — Encounter: Payer: Self-pay | Admitting: *Deleted

## 2016-11-19 DIAGNOSIS — R350 Frequency of micturition: Secondary | ICD-10-CM | POA: Diagnosis not present

## 2016-11-30 ENCOUNTER — Telehealth: Payer: Self-pay | Admitting: Allergy and Immunology

## 2016-11-30 NOTE — Telephone Encounter (Signed)
Spoke to patient advised he could purchase regular drinking water store brand since he is not able to get distilled water. Patient verbalized understanding. Once he finds distilled water her can switch to it.
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
## 2016-12-12 ENCOUNTER — Other Ambulatory Visit: Payer: Self-pay | Admitting: Allergy and Immunology

## 2016-12-13 ENCOUNTER — Other Ambulatory Visit: Payer: Self-pay | Admitting: Allergy and Immunology

## 2016-12-15 ENCOUNTER — Ambulatory Visit: Payer: Medicare Other | Admitting: Family Medicine

## 2016-12-17 DIAGNOSIS — R350 Frequency of micturition: Secondary | ICD-10-CM | POA: Diagnosis not present

## 2017-01-11 ENCOUNTER — Other Ambulatory Visit: Payer: Self-pay | Admitting: Family Medicine

## 2017-01-12 DIAGNOSIS — R350 Frequency of micturition: Secondary | ICD-10-CM | POA: Diagnosis not present

## 2017-01-13 ENCOUNTER — Other Ambulatory Visit: Payer: Self-pay | Admitting: Family Medicine

## 2017-02-02 ENCOUNTER — Ambulatory Visit: Payer: Medicare Other | Admitting: Allergy and Immunology

## 2017-02-12 ENCOUNTER — Encounter: Payer: Self-pay | Admitting: Family Medicine

## 2017-02-12 ENCOUNTER — Ambulatory Visit (INDEPENDENT_AMBULATORY_CARE_PROVIDER_SITE_OTHER): Payer: Medicare Other | Admitting: Family Medicine

## 2017-02-12 VITALS — BP 151/87 | HR 67 | Temp 97.7°F | Ht 69.0 in | Wt 179.0 lb

## 2017-02-12 DIAGNOSIS — Z0000 Encounter for general adult medical examination without abnormal findings: Secondary | ICD-10-CM | POA: Diagnosis not present

## 2017-02-12 DIAGNOSIS — E785 Hyperlipidemia, unspecified: Secondary | ICD-10-CM | POA: Diagnosis not present

## 2017-02-12 DIAGNOSIS — E039 Hypothyroidism, unspecified: Secondary | ICD-10-CM

## 2017-02-12 DIAGNOSIS — R209 Unspecified disturbances of skin sensation: Secondary | ICD-10-CM

## 2017-02-12 NOTE — Progress Notes (Signed)
   HPI  Patient presents today here for follow-up chronic medical conditions, annual physical exam.  Patient exercises 15 minutes to 1 hour daily. He watches his diet carefully. He has had difficulty with depression recently but has found a psychiatrist that he feels that he is doing a very good job. He states that he has struggled with depression, previously he was having suicidal thoughts and now states that he does not want to hurt himself.  He does have feelings of being better off dead, at times.  Hypothyroidism Good medication compliance.  Hyperlipidemia Good med compliance and tolerance. Watching diet. Exercising regularly.  Cold feet Patient states that he has had symptoms for years of cold feet, he states that during the winter he has much difficulty but in the summer no problems at all. No color change of the foot, no hair loss of the feet, normal strength in the feet. Also normal sensation in the feet  PMH: Smoking status noted ROS: Per HPI  Objective: BP (!) 151/87 (BP Location: Right Arm, Patient Position: Sitting, Cuff Size: Normal)   Pulse 67   Temp 97.7 F (36.5 C) (Oral)   Ht '5\' 9"'$  (1.753 m)   Wt 179 lb (81.2 kg)   BMI 26.43 kg/m  Gen: NAD, alert, cooperative with exam HEENT: NCAT, sclera white bilaterally CV: RRR, good S1/S2, no murmur Resp: CTABL, no wheezes, non-labored Ext: Normal amount of hair and 2+ DP and dorsalis pedis pulses on the bilateral feet, no swelling Neuro: Alert and oriented, No gross deficits  Assessment and plan:  #Annual physical exam Normal exam Fasting labs today Patient with a very healthy lifestyle  #Hypothyroidism Stable, repeat labs  #Sensation of cold in lower extremity, bilateral sensation of cold feet No objective findings I do not believe this is poor circulation, however it could be Raynaud's phenomenon Also consider neuropathy, consider neurology referral  #Hyperlipidemia Fasting labs today continue  Zocor.     Orders Placed This Encounter  Procedures  . CMP14+EGFR  . Lipid panel  . CBC with Differential/Platelet  . TSH  . [place], MD [facility] 02/12/2017, 1:48 PM

## 2017-02-13 LAB — CMP14+EGFR
ALBUMIN: 4.6 g/dL (ref 3.5–4.8)
ALT: 21 IU/L (ref 0–44)
AST: 25 IU/L (ref 0–40)
Albumin/Globulin Ratio: 1.8 (ref 1.2–2.2)
Alkaline Phosphatase: 67 IU/L (ref 39–117)
BILIRUBIN TOTAL: 0.5 mg/dL (ref 0.0–1.2)
BUN / CREAT RATIO: 15 (ref 10–24)
BUN: 13 mg/dL (ref 8–27)
CHLORIDE: 100 mmol/L (ref 96–106)
CO2: 26 mmol/L (ref 20–29)
Calcium: 9.2 mg/dL (ref 8.6–10.2)
Creatinine, Ser: 0.84 mg/dL (ref 0.76–1.27)
GFR calc non Af Amer: 86 mL/min/{1.73_m2} (ref >59)
GFR, EST AFRICAN AMERICAN: 99 mL/min/{1.73_m2} (ref >59)
GLUCOSE: 94 mg/dL (ref 65–99)
Globulin, Total: 2.5 g/dL (ref 1.5–4.5)
Potassium: 3.9 mmol/L (ref 3.5–5.2)
Sodium: 142 mmol/L (ref 134–144)
Total Protein: 7.1 g/dL (ref 6.0–8.5)

## 2017-02-13 LAB — CBC WITH DIFFERENTIAL/PLATELET
Basophils Absolute: 0 10*3/uL (ref 0.0–0.2)
Basos: 0 %
EOS (ABSOLUTE): 0.2 10*3/uL (ref 0.0–0.4)
EOS: 4 %
HEMATOCRIT: 42.4 % (ref 37.5–51.0)
Hemoglobin: 14.4 g/dL (ref 13.0–17.7)
IMMATURE GRANULOCYTES: 0 %
Immature Grans (Abs): 0 10*3/uL (ref 0.0–0.1)
LYMPHS ABS: 2.3 10*3/uL (ref 0.7–3.1)
Lymphs: 42 %
MCH: 31.4 pg (ref 26.6–33.0)
MCHC: 34 g/dL (ref 31.5–35.7)
MCV: 92 fL (ref 79–97)
MONOS ABS: 0.4 10*3/uL (ref 0.1–0.9)
Monocytes: 8 %
NEUTROS PCT: 46 %
Neutrophils Absolute: 2.4 10*3/uL (ref 1.4–7.0)
PLATELETS: 225 10*3/uL (ref 150–379)
RBC: 4.59 x10E6/uL (ref 4.14–5.80)
RDW: 13 % (ref 12.3–15.4)
WBC: 5.4 10*3/uL (ref 3.4–10.8)

## 2017-02-13 LAB — LIPID PANEL
Chol/HDL Ratio: 2.9 ratio (ref 0.0–5.0)
Cholesterol, Total: 119 mg/dL (ref 100–199)
HDL: 41 mg/dL (ref >39)
LDL Calculated: 59 mg/dL (ref 0–99)
Triglycerides: 96 mg/dL (ref 0–149)
VLDL CHOLESTEROL CAL: 19 mg/dL (ref 5–40)

## 2017-02-13 LAB — TSH: TSH: 1.92 u[IU]/mL (ref 0.450–4.500)

## 2017-02-13 LAB — PSA: PROSTATE SPECIFIC AG, SERUM: 1.2 ng/mL (ref 0.0–4.0)

## 2017-02-19 ENCOUNTER — Ambulatory Visit: Payer: Medicare Other | Admitting: Family Medicine

## 2017-02-25 DIAGNOSIS — R350 Frequency of micturition: Secondary | ICD-10-CM | POA: Diagnosis not present

## 2017-03-12 ENCOUNTER — Other Ambulatory Visit: Payer: Self-pay | Admitting: Allergy and Immunology

## 2017-03-16 ENCOUNTER — Other Ambulatory Visit: Payer: Self-pay | Admitting: Family Medicine

## 2017-03-17 ENCOUNTER — Ambulatory Visit (INDEPENDENT_AMBULATORY_CARE_PROVIDER_SITE_OTHER): Payer: Medicare Other

## 2017-03-17 ENCOUNTER — Encounter: Payer: Self-pay | Admitting: Family Medicine

## 2017-03-17 ENCOUNTER — Ambulatory Visit (INDEPENDENT_AMBULATORY_CARE_PROVIDER_SITE_OTHER): Payer: Medicare Other | Admitting: Family Medicine

## 2017-03-17 VITALS — BP 145/84 | HR 70 | Temp 97.3°F | Ht 69.0 in | Wt 179.2 lb

## 2017-03-17 DIAGNOSIS — M25562 Pain in left knee: Secondary | ICD-10-CM

## 2017-03-17 DIAGNOSIS — M179 Osteoarthritis of knee, unspecified: Secondary | ICD-10-CM | POA: Diagnosis not present

## 2017-03-17 MED ORDER — TRIAMCINOLONE ACETONIDE 40 MG/ML IJ SUSP
40.0000 mg | Freq: Once | INTRAMUSCULAR | Status: AC
  Administered 2017-03-17: 40 mg via INTRA_ARTICULAR

## 2017-03-17 NOTE — Patient Instructions (Signed)
Great to see you!  Try some Ice, 15 minutes, 3-4 times daily for 2-4 days  Try compression ( like a simple knee sleeve) especially with exercise or walking long distances ( like grocery shopping).
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
## 2017-03-22 ENCOUNTER — Encounter: Payer: Self-pay | Admitting: Allergy & Immunology

## 2017-03-23 ENCOUNTER — Ambulatory Visit: Payer: Medicare Other | Admitting: Allergy and Immunology

## 2017-03-25 DIAGNOSIS — R350 Frequency of micturition: Secondary | ICD-10-CM | POA: Diagnosis not present

## 2017-04-19 ENCOUNTER — Encounter: Payer: Self-pay | Admitting: Family Medicine

## 2017-04-19 ENCOUNTER — Ambulatory Visit (INDEPENDENT_AMBULATORY_CARE_PROVIDER_SITE_OTHER): Payer: Medicare Other | Admitting: Family Medicine

## 2017-04-19 VITALS — BP 135/84 | HR 77 | Temp 97.1°F | Ht 69.0 in | Wt 176.4 lb

## 2017-04-19 DIAGNOSIS — M25562 Pain in left knee: Secondary | ICD-10-CM

## 2017-04-19 NOTE — Progress Notes (Signed)
   HPI  Patient presents today with persistent left knee swelling.  Patient was seen on January 23, about 4 weeks ago, with left knee pain and swelling. He had no clear etiology but it was felt that he could have meniscal injury. Left knee injection with Kenalog was given, patient's pain improved but he continues to have swelling. He had some resumption of pain this weekend.  Patient continues to use his Nordic track for 30-60 minutes a day.  No injury  PMH: Smoking status noted ROS: Per HPI  Objective: BP 135/84   Pulse 77   Temp (!) 97.1 F (36.2 C) (Oral)   Ht 5\' 9"  (1.753 m)   Wt 176 lb 6.4 oz (80 kg)   BMI 26.05 kg/m  Gen: NAD, alert, cooperative with exam HEENT: NCAT, EOMI, PERRL CV: RRR, good S1/S2, no murmur Ext: No edema, warm Neuro: Alert and oriented, No gross deficits MSK:  Gross swelling of the left knee compared to the right knee, no erythema, warmth, or gross deformity except for the swelling.  Assessment and plan:  #Knee pain Pain improved, however recurrence this weekend, patient still with swelling persistently. Conservative therapies are not helping. Refer to orthopedics, he was previously seen by Dr. Gladstone Lighter   Orders Placed This Encounter  Procedures  . Ambulatory referral to Orthopedic Surgery    Referral Priority:   Routine    Referral Type:   Surgical    Referral Reason:   Specialty Services Required    Requested Specialty:   Orthopedic Surgery    Number of Visits Requested:   1    No orders of the defined types were placed in this encounter.   Laroy Apple, MD [facility] 04/19/2017, 4:19 PM

## 2017-04-22 DIAGNOSIS — R350 Frequency of micturition: Secondary | ICD-10-CM | POA: Diagnosis not present

## 2017-04-27 DIAGNOSIS — M25562 Pain in left knee: Secondary | ICD-10-CM | POA: Diagnosis not present

## 2017-04-28 ENCOUNTER — Other Ambulatory Visit: Payer: Self-pay | Admitting: Orthopedic Surgery

## 2017-04-28 DIAGNOSIS — M25562 Pain in left knee: Secondary | ICD-10-CM

## 2017-05-06 ENCOUNTER — Ambulatory Visit
Admission: RE | Admit: 2017-05-06 | Discharge: 2017-05-06 | Disposition: A | Discharge status: Home / Self Care | Payer: Medicare Other | Source: Ambulatory Visit | Attending: Orthopedic Surgery | Admitting: Orthopedic Surgery

## 2017-05-06 DIAGNOSIS — M25561 Pain in right knee: Secondary | ICD-10-CM | POA: Diagnosis not present

## 2017-05-06 DIAGNOSIS — M25562 Pain in left knee: Secondary | ICD-10-CM

## 2017-05-11 ENCOUNTER — Encounter: Payer: Self-pay | Admitting: Allergy and Immunology

## 2017-05-11 ENCOUNTER — Ambulatory Visit: Payer: Medicare Other | Admitting: Allergy and Immunology

## 2017-05-11 VITALS — BP 148/98 | HR 72 | Resp 18

## 2017-05-11 DIAGNOSIS — J3089 Other allergic rhinitis: Secondary | ICD-10-CM | POA: Diagnosis not present

## 2017-05-11 DIAGNOSIS — J4530 Mild persistent asthma, uncomplicated: Secondary | ICD-10-CM

## 2017-05-11 DIAGNOSIS — K219 Gastro-esophageal reflux disease without esophagitis: Secondary | ICD-10-CM

## 2017-05-11 NOTE — Progress Notes (Signed)
Follow-up Note  Referring Provider: Wardell Honour, MD Primary Provider: Timmothy Euler, MD Date of Office Visit: 05/11/2017  Subjective:   [name] (DOB: [birth year]) is a 77 y.o. male who returns to the [facility] [place] on 05/11/2017 in re-evaluation of the following:  HPI: Rolla returns to this clinic in reevaluation of his asthma and allergic rhinitis and rhinitis medicamentosa and LPR.  His last visit to this clinic was 09 October 2016 at which point in time he apparently had a respiratory tract flare requiring a systemic steroid as addressed by Dr. Nelva Bush.  Since that visit he has done wonderful and has not required a systemic steroid or an antibiotic for any type of respiratory tract issue.  He exercises almost 60 minutes/day with no problem and does not use a short acting bronchodilator.  His nose is doing very well while consistently using a combination of Afrin and over-the-counter Rhinocort.  His reflux and his throat issues under excellent control while consistently using omeprazole twice a day.  He did obtain a flu vaccine.  Allergies as of 05/11/2017      Reactions   Celexa [citalopram Hydrobromide]    Panic attacks   Cymbalta [duloxetine Hcl]    Panic / anxiety attacks   Procardia [nifedipine] Other (See Comments)   Weakness   Wellbutrin [bupropion] Anxiety   Panic attacks      Medication List      ALPRAZolam 0.5 MG tablet Commonly known as:  XANAX TAKE TWO TABLETS BY MOUTH THREE TIMES DAILY (8AM, 2PM, AND 8PM)   aspirin 81 MG tablet Take 81 mg by mouth daily.   chlordiazePOXIDE 5 MG capsule Commonly known as:  LIBRIUM Take 5 mg by mouth at bedtime.   FLOVENT HFA 110 MCG/ACT inhaler Generic drug:  fluticasone INHALE TWO PUFFS TWICE DAILY TO PREVENT COUGH OR WHEEZE. RINSE MOUTH AFTER USE.   levothyroxine 88 MCG tablet Commonly known as:  SYNTHROID, LEVOTHROID TAKE 1 TABLET BY MOUTH EVERY DAY   mirtazapine 45 MG  tablet Commonly known as:  REMERON Take 45 mg by mouth at bedtime.   multivitamin tablet Take 1 tablet by mouth daily.   omeprazole 20 MG capsule Commonly known as:  PRILOSEC TAKE 1 CAPSULE (20 MG TOTAL) BY MOUTH 2 (TWO) TIMES DAILY BEFORE A MEAL.   OVER THE COUNTER MEDICATION neilmeds   RHINOCORT AQUA NA Place 1 puff into the nose. Three times a week   simvastatin 20 MG tablet Commonly known as:  ZOCOR Take 1 tablet (20 mg total) by mouth at bedtime.   Vitamin D 2000 units Caps Take 1 capsule by mouth daily.       Past Medical History:  Diagnosis Date  . Allergic rhinitis, cause unspecified   . Allergy    a lot seasonal allergies  . Anxiety   . Arthritis    degenerative arthritis of the neck  . Depression   . GERD (gastroesophageal reflux disease)   . Hyperlipidemia   . Hypothyroidism   . Irritable bowel syndrome   . Kidney stones    per pt, not sure if had kidney stones.  . Obesity   . Panic attacks 03/2015  . Shoulder pain, right   . Unspecified asthma(493.90)     Past Surgical History:  Procedure Laterality Date  . CATARACT EXTRACTION BILATERAL W/ ANTERIOR VITRECTOMY  2005  . ROTATOR CUFF REPAIR  2005   Left Shoulder  . TONSILLECTOMY    . TRANSURETHRAL  RESECTION OF PROSTATE  2005  . VASECTOMY      Review of systems negative except as noted in HPI / PMHx or noted below:  Review of Systems  Constitutional: Negative.   HENT: Negative.   Eyes: Negative.   Respiratory: Negative.   Cardiovascular: Negative.   Gastrointestinal: Negative.   Genitourinary: Negative.   Musculoskeletal: Negative.   Skin: Negative.   Neurological: Negative.   Endo/Heme/Allergies: Negative.   Psychiatric/Behavioral: Negative.      Objective:   Vitals:   05/11/17 1727  BP: (!) 148/98  Pulse: 72  Resp: 18          Physical Exam  Constitutional: He is well-developed, well-nourished, and in no distress.  HENT:  Head: Normocephalic.  Right Ear: Tympanic  membrane, external ear and ear canal normal.  Left Ear: Tympanic membrane, external ear and ear canal normal.  Nose: Nose normal. No mucosal edema or rhinorrhea.  Mouth/Throat: Uvula is midline, oropharynx is clear and moist and mucous membranes are normal. No oropharyngeal exudate.  Eyes: Conjunctivae are normal.  Neck: Trachea normal. No tracheal tenderness present. No tracheal deviation present. No thyromegaly present.  Cardiovascular: Normal rate, regular rhythm, S1 normal, S2 normal and normal heart sounds.  No murmur heard. Pulmonary/Chest: Breath sounds normal. No stridor. No respiratory distress. He has no wheezes. He has no rales.  Musculoskeletal: He exhibits no edema.  Lymphadenopathy:       Head (right side): No tonsillar adenopathy present.       Head (left side): No tonsillar adenopathy present.    He has no cervical adenopathy.  Neurological: He is alert. Gait normal.  Skin: No rash noted. He is not diaphoretic. No erythema. Nails show no clubbing.  Psychiatric: Mood and affect normal.    Diagnostics:    Spirometry was performed and demonstrated an FEV1 of 3.20 at 109 % of predicted.  Assessment and Plan:   1. Asthma, well controlled, mild persistent   2. Other allergic rhinitis   3. LPRD (laryngopharyngeal reflux disease)     1. Continue omeprazole 20 mg twice a day  2. Decrease Flovent 110 two inhalations once a day with spacer  3. Continue OTC Rhinocort one spray each nostril 3-7 times per week  4. Continue nasal saline wash, Systane eyedrops, Afrin, and ProAir or if needed  5. Return to clinic in 6 months or earlier if a problem   Overall Chaske appears to be doing quite well regarding his respiratory tract condition while consistently using anti-inflammatory agents for his respiratory tract and therapy directed against reflux.  He continues to use Afrin and has done so for many many years and there is no pretense about the fact that he is going to continue  to use this agent but he is using it in conjunction with his Rhinocort and this should hopefully prevent him from developing significant rebound and indeed this does appear to be a relatively good combination of therapy for him at this point.  I will see him back in this clinic in 6 months or earlier if there is a problem.  Allena Katz, MD Allergy / Immunology [place]

## 2017-05-11 NOTE — Patient Instructions (Addendum)
  1. Continue omeprazole 20 mg twice a day  2. Decrease Flovent 110 two inhalations once a day with spacer  3. Continue OTC Rhinocort one spray each nostril 3-7 times per week  4. Continue nasal saline wash, Systane eyedrops, Afrin, and ProAir or if needed  5. Return to clinic in 6 months or earlier if a problem

## 2017-05-12 ENCOUNTER — Encounter: Payer: Self-pay | Admitting: Allergy and Immunology

## 2017-05-13 DIAGNOSIS — M25562 Pain in left knee: Secondary | ICD-10-CM | POA: Diagnosis not present

## 2017-05-20 DIAGNOSIS — R350 Frequency of micturition: Secondary | ICD-10-CM | POA: Diagnosis not present

## 2017-06-08 ENCOUNTER — Other Ambulatory Visit: Payer: Self-pay | Admitting: Allergy and Immunology

## 2017-06-17 DIAGNOSIS — R350 Frequency of micturition: Secondary | ICD-10-CM | POA: Diagnosis not present

## 2017-07-01 ENCOUNTER — Ambulatory Visit (INDEPENDENT_AMBULATORY_CARE_PROVIDER_SITE_OTHER): Payer: Medicare Other | Admitting: Family Medicine

## 2017-07-01 ENCOUNTER — Encounter: Payer: Self-pay | Admitting: Family Medicine

## 2017-07-01 VITALS — BP 124/84 | HR 76 | Temp 97.5°F | Ht 69.0 in | Wt 178.2 lb

## 2017-07-01 DIAGNOSIS — L989 Disorder of the skin and subcutaneous tissue, unspecified: Secondary | ICD-10-CM

## 2017-07-01 NOTE — Progress Notes (Signed)
   HPI  Patient presents today here for skin lesion.  Patient explains a skin lesion started on the scalp about 6 weeks ago, he states that it appeared suddenly and has not changed much since that time.  It is not irritated, bleeding, or changing rapidly.    PMH: Smoking status noted ROS: Per HPI  Objective: BP 124/84   Pulse 76   Temp (!) 97.5 F (36.4 C) (Oral)   Ht 5\' 9"  (1.753 m)   Wt 178 lb 3.2 oz (80.8 kg)   BMI 26.32 kg/m  Gen: NAD, alert, cooperative with exam HEENT: NCAT CV: RRR, good S1/S2, no murmur Resp: CTABL, no wheezes, non-labored Ext: No edema, warm Neuro: Alert and oriented, No gross deficits Skin: 2 cm x 0.7 cm hyperkeratotic raised lesion on the left parietal occipital scalp  Assessment and plan:  #Scalp lesion With sudden appearance and large size there is at least a moderate chance of squamous cell carcinoma Refer to dermatology      Orders Placed This Encounter  Procedures  . Ambulatory referral to Dermatology    Referral Priority:   Routine    Referral Type:   Consultation    Referral Reason:   Specialty Services Required    Requested Specialty:   Dermatology    Number of Visits Requested:   1    No orders of the defined types were placed in this encounter.   Laroy Apple, MD [facility] 07/01/2017, 1:45 PM

## 2017-07-05 ENCOUNTER — Telehealth: Payer: Self-pay | Admitting: Family Medicine

## 2017-07-05 NOTE — Telephone Encounter (Signed)
Please advise 

## 2017-07-15 DIAGNOSIS — R350 Frequency of micturition: Secondary | ICD-10-CM | POA: Diagnosis not present

## 2017-08-08 ENCOUNTER — Other Ambulatory Visit: Payer: Self-pay | Admitting: Allergy and Immunology

## 2017-08-12 DIAGNOSIS — L820 Inflamed seborrheic keratosis: Secondary | ICD-10-CM | POA: Diagnosis not present

## 2017-08-12 DIAGNOSIS — D485 Neoplasm of uncertain behavior of skin: Secondary | ICD-10-CM | POA: Diagnosis not present

## 2017-08-12 DIAGNOSIS — L819 Disorder of pigmentation, unspecified: Secondary | ICD-10-CM | POA: Diagnosis not present

## 2017-08-13 ENCOUNTER — Ambulatory Visit (INDEPENDENT_AMBULATORY_CARE_PROVIDER_SITE_OTHER): Payer: Medicare Other | Admitting: Family Medicine

## 2017-08-13 ENCOUNTER — Encounter: Payer: Self-pay | Admitting: Family Medicine

## 2017-08-13 VITALS — BP 121/78 | HR 74 | Temp 97.3°F | Ht 69.0 in | Wt 180.4 lb

## 2017-08-13 DIAGNOSIS — L821 Other seborrheic keratosis: Secondary | ICD-10-CM

## 2017-08-13 DIAGNOSIS — E785 Hyperlipidemia, unspecified: Secondary | ICD-10-CM

## 2017-08-13 DIAGNOSIS — L309 Dermatitis, unspecified: Secondary | ICD-10-CM

## 2017-08-13 MED ORDER — TRIAMCINOLONE ACETONIDE 0.5 % EX OINT: 1.0000 "application " | TOPICAL_OINTMENT | Freq: Two times a day (BID) | CUTANEOUS | 0 refills | Status: DC

## 2017-08-13 NOTE — Patient Instructions (Signed)
Great to see you!  Try Triamcinolone ointment on your ankle lesion.

## 2017-08-13 NOTE — Progress Notes (Signed)
   HPI  Patient presents today for follow-up chronic medical conditions.  Patient reports good medication compliance and tolerance with simvastatin He is watching his diet, he exercises about 30 minutes to 1 hour a day on a Nordic track.  He is working closely with his asthma doctor and has recently reduce his Flovent to once daily and doing well. He is recently had the concerning skin lesion on his scalp removed. He has not no other skin lesion on his right cheek that he would like to have examined, is been there for quite some time, not changing, asymptomatic.  Left medial heel ulcer with another skin lesion that was very itchy and is now improving.  Its been present for about a year  PMH: Smoking status noted ROS: Per HPI  Objective: BP 121/78   Pulse 74   Temp (!) 97.3 F (36.3 C) (Oral)   Ht 5\' 9"  (1.753 m)   Wt 180 lb 6.4 oz (81.8 kg)   BMI 26.64 kg/m  Gen: NAD, alert, cooperative with exam HEENT: NCAT CV: RRR, good S1/S2, no murmur Resp: CTABL, no wheezes, non-labored Abd: SNTND, BS present, no guarding or organomegaly Ext: No edema, warm Neuro: Alert and oriented, No gross deficits Skin Right face over the right TMJ with approximately 5 to 8 mm in diameter stuck on appearing brown lesion Left medial heel with slightly hyperkeratotic scaly lesion approximately 4 cm in diameter  Assessment and plan:  #Hyperlipidemia Doing well with simvastatin, no changes Labs up-to-date  #Seborrheic keratosis Reassurance provided, also check with his dermatologist when he follows up, this is on the right face  #Atopic dermatitis, eczema Most likely diagnosis left medial heel Kenalog ointment  Meds ordered this encounter  Medications  . triamcinolone ointment (KENALOG) 0.5 %    Sig: Apply 1 application topically 2 (two) times daily.    Dispense:  30 g    Refill:  [place], MD [facility] 08/13/2017, 1:47 PM

## 2017-08-23 ENCOUNTER — Other Ambulatory Visit: Payer: Self-pay | Admitting: Family Medicine

## 2017-08-24 ENCOUNTER — Telehealth: Payer: Self-pay

## 2017-08-24 MED ORDER — SIMVASTATIN 20 MG PO TABS: 20.0000 mg | ORAL_TABLET | Freq: Every day | ORAL | 1 refills | Status: DC

## 2017-08-24 NOTE — Telephone Encounter (Signed)
Requested refill sent

## 2017-08-24 NOTE — Telephone Encounter (Signed)
Last seen 02/12/17.

## 2017-08-27 DIAGNOSIS — R350 Frequency of micturition: Secondary | ICD-10-CM | POA: Diagnosis not present

## 2017-10-08 DIAGNOSIS — R350 Frequency of micturition: Secondary | ICD-10-CM | POA: Diagnosis not present

## 2017-11-16 ENCOUNTER — Ambulatory Visit: Payer: Medicare Other | Admitting: Allergy and Immunology

## 2017-11-16 ENCOUNTER — Encounter: Payer: Self-pay | Admitting: Allergy and Immunology

## 2017-11-16 VITALS — BP 126/86 | HR 76 | Resp 16

## 2017-11-16 DIAGNOSIS — J3089 Other allergic rhinitis: Secondary | ICD-10-CM | POA: Diagnosis not present

## 2017-11-16 DIAGNOSIS — J4530 Mild persistent asthma, uncomplicated: Secondary | ICD-10-CM | POA: Diagnosis not present

## 2017-11-16 DIAGNOSIS — K219 Gastro-esophageal reflux disease without esophagitis: Secondary | ICD-10-CM | POA: Diagnosis not present

## 2017-11-16 NOTE — Patient Instructions (Signed)
  1. Continue omeprazole 20 mg twice a day  2. Continue Flovent 110 two inhalations once a day with spacer  3. Continue OTC Rhinocort one spray each nostril 3-7 times per week  4. Continue nasal saline wash, Systane eyedrops, Afrin, and ProAir or if needed  5. Return to clinic in 6 months or earlier if a problem  6.  Obtain fall flu vaccine

## 2017-11-16 NOTE — Progress Notes (Signed)
Follow-up Note  Referring Provider: Timmothy Euler, MD Primary Provider: Claretta Fraise, MD Date of Office Visit: 11/16/2017  Subjective:   [name] (DOB: [birth year]) is a 77 y.o. male who returns to the [facility] [place] on 11/16/2017 in re-evaluation of the following:  HPI: [name] returns to this clinic in evaluation of asthma and allergic rhinitis and rhinitis medicamentosa and LPR.  His last visit to this clinic was 11 May 2017.  He continues to do very well with his upper airways while using a combination of Afrin and Rhinocort.  This combination has kept his nose wide open and he has no difficulty sleeping at nighttime.  He does not use Afrin any other time except at bedtime.  He has not had any sinus infections and has not required an antibiotic.  His asthma has been nonexistent and he is exercising 60 minutes a day.  Rarely if ever does use a short acting bronchodilator.  During his last visit we did taper down his Flovent to 1 time per day administration.  He has had no issues with reflux and no issues with his throat while using omeprazole 20 mgs twice a day.  Occasionally he does use over-the-counter Tums to supplement his omeprazole.  Allergies as of 11/16/2017      Reactions   Celexa [citalopram Hydrobromide]    Panic attacks   Cymbalta [duloxetine Hcl]    Panic / anxiety attacks   Procardia [nifedipine] Other (See Comments)   Weakness   Wellbutrin [bupropion] Anxiety   Panic attacks      Medication List      ALPRAZolam 0.5 MG tablet Commonly known as:  XANAX TAKE TWO TABLETS BY MOUTH THREE TIMES DAILY (8AM, 2PM, AND 8PM)   aspirin 81 MG tablet Take 81 mg by mouth daily.   chlordiazePOXIDE 5 MG capsule Commonly known as:  LIBRIUM Take 15 mg by mouth at bedtime.   FLOVENT HFA 110 MCG/ACT inhaler Generic drug:  fluticasone INHALE TWO PUFFS TWICE DAILY TO PREVENT COUGH OR WHEEZE. RINSE MOUTH AFTER USE.   levothyroxine 88 MCG  tablet Commonly known as:  SYNTHROID, LEVOTHROID TAKE 1 TABLET BY MOUTH EVERY DAY   mirtazapine 45 MG tablet Commonly known as:  REMERON Take 45 mg by mouth at bedtime.   multivitamin tablet Take 1 tablet by mouth daily.   omeprazole 20 MG capsule Commonly known as:  PRILOSEC TAKE 1 CAPSULE (20 MG TOTAL) BY MOUTH 2 (TWO) TIMES DAILY BEFORE A MEAL.   RHINOCORT AQUA NA Place 1 puff into the nose. Three times a week   simvastatin 20 MG tablet Commonly known as:  ZOCOR TAKE 1 TABLET BY MOUTH EVERYDAY AT BEDTIME   triamcinolone ointment 0.5 % Commonly known as:  KENALOG Apply 1 application topically 2 (two) times daily.   Vitamin D 2000 units Caps Take 1 capsule by mouth daily.       Past Medical History:  Diagnosis Date  . Allergic rhinitis, cause unspecified   . Allergy    a lot seasonal allergies  . Anxiety   . Arthritis    degenerative arthritis of the neck  . Depression   . GERD (gastroesophageal reflux disease)   . Hyperlipidemia   . Hypothyroidism   . Irritable bowel syndrome   . Kidney stones    per pt, not sure if had kidney stones.  . Obesity   . Panic attacks 03/2015  . Shoulder pain, right   . Unspecified asthma(493.90)  Past Surgical History:  Procedure Laterality Date  . CATARACT EXTRACTION BILATERAL W/ ANTERIOR VITRECTOMY  2005  . ROTATOR CUFF REPAIR  2005   Left Shoulder  . TONSILLECTOMY    . TRANSURETHRAL RESECTION OF PROSTATE  2005  . VASECTOMY      Review of systems negative except as noted in HPI / PMHx or noted below:  Review of Systems  Constitutional: Negative.   HENT: Negative.   Eyes: Negative.   Respiratory: Negative.   Cardiovascular: Negative.   Gastrointestinal: Negative.   Genitourinary: Negative.   Musculoskeletal: Negative.   Skin: Negative.   Neurological: Negative.   Endo/Heme/Allergies: Negative.   Psychiatric/Behavioral: Negative.      Objective:   Vitals:   11/16/17 1505  BP: 126/86  Pulse: 76   Resp: 16          Physical Exam  HENT:  Head: Normocephalic.  Right Ear: Tympanic membrane, external ear and ear canal normal.  Left Ear: Tympanic membrane, external ear and ear canal normal.  Nose: Nose normal. No mucosal edema or rhinorrhea.  Mouth/Throat: Uvula is midline, oropharynx is clear and moist and mucous membranes are normal. No oropharyngeal exudate.  Eyes: Conjunctivae are normal.  Neck: Trachea normal. No tracheal tenderness present. No tracheal deviation present. No thyromegaly present.  Cardiovascular: Normal rate, regular rhythm, S1 normal, S2 normal and normal heart sounds.  No murmur heard. Pulmonary/Chest: Breath sounds normal. No stridor. No respiratory distress. He has no wheezes. He has no rales.  Musculoskeletal: He exhibits no edema.  Lymphadenopathy:       Head (right side): No tonsillar adenopathy present.       Head (left side): No tonsillar adenopathy present.    He has no cervical adenopathy.  Neurological: He is alert.  Skin: No rash noted. He is not diaphoretic. No erythema. Nails show no clubbing.    Diagnostics:    Spirometry was performed and demonstrated an FEV1 of 3.39 at 117 % of predicted.  The patient had an Asthma Control Test with the following results: ACT Total Score: 25.    Assessment and Plan:   1. Asthma, well controlled, mild persistent   2. Other allergic rhinitis   3. LPRD (laryngopharyngeal reflux disease)     1. Continue omeprazole 20 mg twice a day  2. Continue Flovent 110 two inhalations once a day with spacer  3. Continue OTC Rhinocort one spray each nostril 3-7 times per week  4. Continue nasal saline wash, Systane eyedrops, Afrin, and ProAir or if needed  5. Return to clinic in 6 months or earlier if a problem  6.  Obtain fall flu vaccine   [name] appears to be doing quite well on his current plan.  We will keep him on low-dose inhaled and nasal steroids at this point.  He will continue to use a combination  of Afrin and nasal budesonide which is worked quite well for him over the course of the past several years.  He will continue on omeprazole for his reflux which is working relatively well.  I will see him back in this clinic in 6 months or earlier if there is a problem.  Kevin Katz, MD Allergy / Immunology [place]

## 2017-11-17 ENCOUNTER — Encounter: Payer: Self-pay | Admitting: Allergy and Immunology

## 2017-11-18 DIAGNOSIS — R350 Frequency of micturition: Secondary | ICD-10-CM | POA: Diagnosis not present

## 2017-11-29 ENCOUNTER — Other Ambulatory Visit: Payer: Self-pay | Admitting: Allergy and Immunology

## 2017-12-02 ENCOUNTER — Ambulatory Visit (INDEPENDENT_AMBULATORY_CARE_PROVIDER_SITE_OTHER): Payer: Medicare Other | Admitting: Pediatrics

## 2017-12-02 ENCOUNTER — Encounter: Payer: Self-pay | Admitting: Pediatrics

## 2017-12-02 VITALS — BP 126/81 | HR 75 | Temp 97.6°F | Ht 69.0 in | Wt 178.2 lb

## 2017-12-02 DIAGNOSIS — R918 Other nonspecific abnormal finding of lung field: Secondary | ICD-10-CM | POA: Diagnosis not present

## 2017-12-02 DIAGNOSIS — R911 Solitary pulmonary nodule: Secondary | ICD-10-CM

## 2017-12-02 DIAGNOSIS — R490 Dysphonia: Secondary | ICD-10-CM | POA: Diagnosis not present

## 2017-12-02 DIAGNOSIS — R1310 Dysphagia, unspecified: Secondary | ICD-10-CM | POA: Diagnosis not present

## 2017-12-02 NOTE — Progress Notes (Addendum)
Subjective:   Patient ID: [name], male    DOB: 08/28/1940, 77 y.o.   MRN: [id number] CC: trouble swallowing and Hoarse  HPI: [name] is a 77 y.o. male   For past 6 weeks to two months has trouble with food getting stuck, has to cough to dislodge it before he can swallow. Doesn't happen with veg, bread, fruit. Only happens with meat.  Has happened a couple of dozen times.  Feels like he gets lodged in his throat near his larynx.  He has had worsening hoarseness over the last couple weeks.  He has had more allergy symptoms, sneezing more often.  He uses Rhinocort 3 times a week, Afrin nose spray once a day at night, Flovent once daily.  Not taking antihistamine right now.  He does have seasonal allergies, tends to get symptoms this time a year.  Following with allergy and immunology for asthma and allergies.  Had CT scan in 2016 for vocal cord paralysis showing lung nodules, had a PET scan that showed no hypermetabolic pulmonary nodules, had a 61-month follow-up CT scan with resolution of the spiculated nodule that was initially seen, recommended follow-up CT scan for remaining lung nodules, was scheduled for 9 months later.  Patient says he decided not to follow-up at the time of the call for the CT scan.  No fevers, no weight loss.  Smoked off and on primarily on weekends from 77 years old to 77 years old.  Smoked 2 packs a day for 2 years, completely quit cigarettes at age 69.  Relevant past medical, surgical, family and social history reviewed. Allergies and medications reviewed and updated. Social History   Tobacco Use  Smoking Status Former Smoker  . Packs/day: 2.00  . Years: 2.00  . Pack years: 4.00  . Types: Cigarettes  . Last attempt to quit: 02/23/1969  . Years since quitting: 48.8  Smokeless Tobacco Never Used   ROS: Per HPI   Objective:    BP 126/81   Pulse 75   Temp 97.6 F (36.4 C) (Oral)   Ht 5\' 9"  (1.753 m)   Wt 178 lb 3.2 oz (80.8 kg)   BMI 26.32  kg/m   Wt Readings from Last 3 Encounters:  12/02/17 178 lb 3.2 oz (80.8 kg)  08/13/17 180 lb 6.4 oz (81.8 kg)  07/01/17 178 lb 3.2 oz (80.8 kg)    Gen: NAD, alert, cooperative with exam, NCAT EYES: EOMI, no conjunctival injection, or no icterus ENT:  TMs pearly gray b/l, OP without erythema LYMPH: no cervical LAD CV: NRRR, normal S1/S2, no murmur, distal pulses 2+ b/l Resp: CTABL, no wheezes, normal WOB Ext: No edema, warm Neuro: Alert and oriented, strength equal b/l UE and LE, coordination grossly normal MSK: normal muscle bulk  Assessment & Plan:  [name] was seen today for trouble swallowing and hoarse.  Diagnoses and all orders for this visit:  Dysphagia, unspecified type We will refer for evaluation.  Happening regularly. -     Ambulatory referral to Gastroenterology  Hoarseness Recommend start antihistamine daily for the next 2 weeks.  Lung nodule Abnormal findings on diagnostic imaging of lung Recommended getting follow-up CT scan is recommended from last CT scan, patient agrees. -     CT Chest Wo Contrast; Future  I spent 25 minutes with the patient with over 50% of the encounter time dedicated to counseling on the above problems.  Follow up plan: Return in about 4 weeks (around 12/30/2017). Assunta Found, MD  [place]

## 2017-12-02 NOTE — Patient Instructions (Signed)
Take cetirizine or similar daytime antihistamine once a day

## 2017-12-14 ENCOUNTER — Encounter (INDEPENDENT_AMBULATORY_CARE_PROVIDER_SITE_OTHER): Payer: Self-pay | Admitting: Internal Medicine

## 2017-12-14 ENCOUNTER — Ambulatory Visit (INDEPENDENT_AMBULATORY_CARE_PROVIDER_SITE_OTHER): Payer: Medicare Other | Admitting: Internal Medicine

## 2017-12-14 VITALS — BP 142/90 | HR 54 | Temp 97.8°F | Ht 69.0 in | Wt 178.3 lb

## 2017-12-14 DIAGNOSIS — R1319 Other dysphagia: Secondary | ICD-10-CM

## 2017-12-14 DIAGNOSIS — R1310 Dysphagia, unspecified: Secondary | ICD-10-CM | POA: Diagnosis not present

## 2017-12-14 NOTE — Progress Notes (Signed)
Subjective:    Patient ID: [name], male    DOB: 1940-08-23, 77 y.o.   MRN: [id number]  HPI Referred by Dr Evette Doffing for dysphagia. Dysphagia x 2 months. Within the last 3 weeks, he has been hoarse. Meats in particular bother him. Meats are slow to go down. He coughs for the bolus to go down.  No trouble with vegetables.  His appetite is okay. No weight loss. No abdominal pain.  Has a BM every 1-3 days. Sometimes he will have diarrhea. Stool schedule varies. Previous patient of Dr. Deatra Ina.    Review of Systems Past Medical History:  Diagnosis Date  . Allergic rhinitis, cause unspecified   . Allergy    a lot seasonal allergies  . Anxiety   . Arthritis    degenerative arthritis of the neck  . Depression   . GERD (gastroesophageal reflux disease)   . Hyperlipidemia   . Hypothyroidism   . Irritable bowel syndrome   . Kidney stones    per pt, not sure if had kidney stones.  . Obesity   . Panic attacks 03/2015  . Shoulder pain, right   . Unspecified asthma(493.90)     Past Surgical History:  Procedure Laterality Date  . CATARACT EXTRACTION BILATERAL W/ ANTERIOR VITRECTOMY  2005  . ROTATOR CUFF REPAIR  2005   Left Shoulder  . TONSILLECTOMY    . TRANSURETHRAL RESECTION OF PROSTATE  2005  . VASECTOMY      Allergies  Allergen Reactions  . Celexa [Citalopram Hydrobromide]     Panic attacks  . Cymbalta [Duloxetine Hcl]     Panic / anxiety attacks  . Procardia [Nifedipine] Other (See Comments)    Weakness   . Wellbutrin [Bupropion] Anxiety    Panic attacks    Current Outpatient Medications on File Prior to Visit  Medication Sig Dispense Refill  . ALPRAZolam (XANAX) 0.5 MG tablet TAKE TWO TABLETS BY MOUTH THREE TIMES DAILY (8AM, 2PM, AND 8PM) (Patient taking differently: 0.5 mg. takt 1/2 tablets BID.) 180 tablet 1  . aspirin 81 MG tablet Take 81 mg by mouth daily.    . Budesonide (RHINOCORT AQUA NA) Place 1 puff into the nose. Three times a week    .  chlordiazePOXIDE (LIBRIUM) 5 MG capsule Take 15 mg by mouth at bedtime.   3  . Cholecalciferol (VITAMIN D) 2000 UNITS CAPS Take 1 capsule by mouth daily.      Marland Kitchen FLOVENT HFA 110 MCG/ACT inhaler INHALE TWO PUFFS TWICE DAILY TO PREVENT COUGH OR WHEEZE. RINSE MOUTH AFTER USE. (Patient taking differently: 2 puffs daily. Inhale two puffs twice daily to prevent cough or wheeze. Rinse mouth after use.) 12 Inhaler 4  . levothyroxine (SYNTHROID, LEVOTHROID) 88 MCG tablet TAKE 1 TABLET BY MOUTH EVERY DAY 90 tablet 3  . mirtazapine (REMERON) 45 MG tablet Take 45 mg by mouth at bedtime.  12  . Multiple Vitamin (MULTIVITAMIN) tablet Take 1 tablet by mouth daily.      . simvastatin (ZOCOR) 20 MG tablet TAKE 1 TABLET BY MOUTH EVERYDAY AT BEDTIME 90 tablet 3  . triamcinolone ointment (KENALOG) 0.5 % Apply 1 application topically 2 (two) times daily. 30 g 0  . omeprazole (PRILOSEC) 20 MG capsule TAKE 1 CAPSULE (20 MG TOTAL) BY MOUTH 2 (TWO) TIMES DAILY BEFORE A MEAL. 180 capsule 1  . [DISCONTINUED] DULoxetine (CYMBALTA) 20 MG capsule Take 1 capsule (20 mg total) by mouth daily. Can increase it to two tablets in one week  45 capsule 0   No current facility-administered medications on file prior to visit.         Objective:   Physical Exam Blood pressure (!) 142/90, pulse (!) 54, temperature 97.8 F (36.6 C), height 5\' 9"  (1.753 m), weight 178 lb 4.8 oz (80.9 kg). Alert and oriented. Skin warm and dry. Oral mucosa is moist.   . Sclera anicteric, conjunctivae is pink. Thyroid not enlarged. No cervical lymphadenopathy. Lungs clear. Heart regular rate and rhythm.  Abdomen is soft. Bowel sounds are positive. No hepatomegaly. No abdominal masses felt. No tenderness.  No edema to lower extremities.           Assessment & Plan:  Dysphagia. EGD/ED.  Esophageal stricture, stenosis, web needs to be rule out. He will call back and we will set up the EGD/ED.

## 2017-12-14 NOTE — Patient Instructions (Addendum)
He will let me know when he is ready.  Continue the Omeprazole. Ask to speak to [facility].

## 2017-12-15 ENCOUNTER — Ambulatory Visit (INDEPENDENT_AMBULATORY_CARE_PROVIDER_SITE_OTHER): Payer: Self-pay | Admitting: Internal Medicine

## 2017-12-24 ENCOUNTER — Ambulatory Visit (HOSPITAL_COMMUNITY): Payer: Medicare Other

## 2017-12-30 ENCOUNTER — Ambulatory Visit: Payer: Medicare Other | Admitting: Pediatrics

## 2017-12-30 DIAGNOSIS — R350 Frequency of micturition: Secondary | ICD-10-CM | POA: Diagnosis not present

## 2017-12-31 ENCOUNTER — Ambulatory Visit (HOSPITAL_COMMUNITY)
Admission: RE | Admit: 2017-12-31 | Discharge: 2017-12-31 | Disposition: A | Discharge status: Home / Self Care | Payer: Medicare Other | Source: Ambulatory Visit | Attending: Pediatrics | Admitting: Pediatrics

## 2017-12-31 DIAGNOSIS — I712 Thoracic aortic aneurysm, without rupture: Secondary | ICD-10-CM | POA: Insufficient documentation

## 2017-12-31 DIAGNOSIS — R918 Other nonspecific abnormal finding of lung field: Secondary | ICD-10-CM | POA: Diagnosis not present

## 2017-12-31 DIAGNOSIS — I2510 Atherosclerotic heart disease of native coronary artery without angina pectoris: Secondary | ICD-10-CM | POA: Insufficient documentation

## 2018-01-03 ENCOUNTER — Ambulatory Visit (INDEPENDENT_AMBULATORY_CARE_PROVIDER_SITE_OTHER): Payer: Medicare Other | Admitting: Pediatrics

## 2018-01-03 ENCOUNTER — Encounter: Payer: Self-pay | Admitting: Pediatrics

## 2018-01-03 VITALS — BP 133/84 | HR 77 | Temp 96.9°F | Ht 69.0 in | Wt 177.4 lb

## 2018-01-03 DIAGNOSIS — R911 Solitary pulmonary nodule: Secondary | ICD-10-CM

## 2018-01-03 DIAGNOSIS — I719 Aortic aneurysm of unspecified site, without rupture: Secondary | ICD-10-CM | POA: Diagnosis not present

## 2018-01-03 DIAGNOSIS — R4702 Dysphasia: Secondary | ICD-10-CM | POA: Diagnosis not present

## 2018-01-03 NOTE — Progress Notes (Signed)
  Subjective:   Patient ID: [name], male    DOB: Dec 17, 1940, 77 y.o.   MRN: [id number] CC: Medical Management of Chronic Issues  HPI: [name] is a 77 y.o. male   Here for follow-up on CT scan results.  Lung nodule: Seen on CT scan in May 2016.  Stable over time, recommended repeat in 1 year  CT scan also with mild aneurysmal dilation of the ascending thoracic aorta which is unchanged, 4 cm.  Recommended annual screening.  The dysphasia that he was experiencing has improved since last visit.  He was seen by GI.  At that time, recommended EGD for evaluation, possibly may need esophageal dilation.  Now he is able to eat whatever he likes without feeling like food gets stuck.  He wonders if some of the symptoms could have been coming from anxiety.  He continues exercising for about an hour on NordicTrack daily.  No chest pain or shortness of breath with exertion.  Relevant past medical, surgical, family and social history reviewed. Allergies and medications reviewed and updated. Social History   Tobacco Use  Smoking Status Former Smoker  . Packs/day: 2.00  . Years: 2.00  . Pack years: 4.00  . Types: Cigarettes  . Last attempt to quit: 02/23/1969  . Years since quitting: 48.8  Smokeless Tobacco Never Used   ROS: Per HPI   Objective:    BP 133/84   Pulse 77   Temp (!) 96.9 F (36.1 C) (Oral)   Ht 5\' 9"  (1.753 m)   Wt 177 lb 6.4 oz (80.5 kg)   BMI 26.20 kg/m   Wt Readings from Last 3 Encounters:  01/03/18 177 lb 6.4 oz (80.5 kg)  12/14/17 178 lb 4.8 oz (80.9 kg)  12/02/17 178 lb 3.2 oz (80.8 kg)    Gen: NAD, alert, cooperative with exam, NCAT EYES: EOMI, no conjunctival injection, or no icterus CV: NRRR, normal S1/S2, no murmur, distal pulses 2+ b/l Resp: CTABL, no wheezes, normal WOB Ext: No edema, warm Neuro: Alert and oriented, strength equal b/l UE and LE, coordination grossly normal MSK: normal muscle bulk  Assessment & Plan:  [name] was seen today  for medical management of chronic issues.  Diagnoses and all orders for this visit:  Lung nodule Will need repeat imaging in 1 year.  Aortic aneurysm without rupture, unspecified portion of aorta (HCC) Will need repeat imaging in 1 year  Dysphasia Resolved.  Continue omeprazole, follow-up with GI if symptoms return.  Follow up plan: Return for As scheduled with PCP. Assunta Found, MD [place]

## 2018-01-10 ENCOUNTER — Telehealth (INDEPENDENT_AMBULATORY_CARE_PROVIDER_SITE_OTHER): Payer: Self-pay | Admitting: *Deleted

## 2018-01-10 NOTE — Telephone Encounter (Signed)
noted 

## 2018-01-10 NOTE — Telephone Encounter (Signed)
Patient called in -- swallowing issues seems to be better -- doesn't feel he needs dilatation at this time -- will call if it happens again

## 2018-02-08 ENCOUNTER — Encounter: Payer: Self-pay | Admitting: Family Medicine

## 2018-02-08 ENCOUNTER — Ambulatory Visit (INDEPENDENT_AMBULATORY_CARE_PROVIDER_SITE_OTHER): Payer: Medicare Other | Admitting: Family Medicine

## 2018-02-08 VITALS — BP 132/89 | HR 71 | Temp 97.4°F | Ht 69.0 in | Wt 173.8 lb

## 2018-02-08 DIAGNOSIS — N400 Enlarged prostate without lower urinary tract symptoms: Secondary | ICD-10-CM | POA: Diagnosis not present

## 2018-02-08 DIAGNOSIS — F321 Major depressive disorder, single episode, moderate: Secondary | ICD-10-CM

## 2018-02-08 DIAGNOSIS — E039 Hypothyroidism, unspecified: Secondary | ICD-10-CM

## 2018-02-08 DIAGNOSIS — E785 Hyperlipidemia, unspecified: Secondary | ICD-10-CM

## 2018-02-08 DIAGNOSIS — R634 Abnormal weight loss: Secondary | ICD-10-CM

## 2018-02-08 NOTE — Progress Notes (Signed)
Subjective:  Patient ID: [name], male    DOB: 1940/09/18  Age: 77 y.o. MRN: [id number]  CC: Hypertension (6 month follow up); Hypothyroidism; and Establish Care   HPI [name] presents for 55-monthfollow-up.  He is concerned about losing weight about 30 pounds apparently over several months.  He is also very depressed he says he has nothing really to live for.  This is in contrast to his PHQ 9 score.  He says he really has nothing to live for with reference to going through an evaluation for the source of weight loss including potential forms of cancer.  He wanted to review his CT scan although Dr. BBritta Mccreedyhas previously done that with him.  He says that he was told he should have every 3 months evaluations when the mass was found 3 years ago.  He decided not to follow-up on that after the very first time until Dr. VEvette Acevedo him recently for an unrelated problem.  At that time he was having dysphagia.  He also felt hoarse.  He continues to have the hoarseness but the dysphagia has resolved.  Patient says that he has been riding a [name] for an hour a day.  He has not got much else to do since his wife died 7 years ago.  She had.  Alzheimer's disease.  He went to see GI and they scheduled him for an EGD but he did not see the doctor and the nurse practitioner assured him that his symptoms did not sound like cancer therefore he has not had the EGD performed even though the hoarseness persists and the weight loss has continued.  Additionally he would like to have the Shingrix shot but he wants to check with his insurance company to see if and how it is covered.  Patient presents for follow-up on  thyroid. The patient has a history of hypothyroidism for many years. It has been stable recently. Pt. denies any change in  voice, loss of hair, heat or cold intolerance. Energy level has been adequate to good. Patient denies constipation and diarrhea. No myxedema. Medication is as noted  below. Verified that pt is taking it daily on an empty stomach. Well tolerated.  Depression screen [facility]/9 02/08/2018 01/03/2018 12/02/2017  Decreased Interest 1 0 0  Down, Depressed, Hopeless 1 0 0  PHQ - 2 Score 2 0 0  Altered sleeping 0 - -  Tired, decreased energy 0 - -  Change in appetite 0 - -  Feeling bad or failure about yourself  0 - -  Trouble concentrating 0 - -  Moving slowly or fidgety/restless 0 - -  Suicidal thoughts 0 - -  PHQ-9 Score 2 - -  Difficult doing work/chores - - -  Some recent data might be hidden    History BPasqualhas a past medical history of Allergic rhinitis, cause unspecified, Allergy, Anxiety, Arthritis, Depression, GERD (gastroesophageal reflux disease), Hyperlipidemia, Hypothyroidism, Irritable bowel syndrome, Kidney stones, Obesity, Panic attacks (03/2015), Shoulder pain, right, and Unspecified asthma(493.90).   He has a past surgical history that includes Tonsillectomy; Vasectomy; Rotator cuff repair (2005); Cataract extraction bilateral w/ anterior vitrectomy (2005); and Transurethral resection of prostate (2005).   His family history includes Breast cancer in his mother; Diabetes in his father; Heart disease in his father; Kidney disease in his mother; Uterine cancer in his mother.He reports that he quit smoking about 48 years ago. His smoking use included cigarettes. He has a 4.00  pack-year smoking history. He has never used smokeless tobacco. He reports that he does not drink alcohol or use drugs.    ROS Review of Systems  Constitutional: Negative.   HENT: Negative.   Eyes: Negative for visual disturbance.  Respiratory: Negative for cough and shortness of breath.   Cardiovascular: Negative for chest pain and leg swelling.  Gastrointestinal: Negative for abdominal pain, diarrhea, nausea and vomiting.  Genitourinary: Negative for difficulty urinating.  Musculoskeletal: Negative for arthralgias and myalgias.  Skin: Negative for rash.    Neurological: Negative for headaches.  Psychiatric/Behavioral: Negative for sleep disturbance.    Objective:  BP 132/89   Pulse 71   Temp (!) 97.4 F (36.3 C) (Oral)   Ht _0  (1.753 m)   Wt 173 lb 12.8 oz (78.8 kg)   BMI 25.67 kg/m   BP Readings from Last 3 Encounters:  02/08/18 132/89  01/03/18 133/84  12/14/17 (!) 142/90    Wt Readings from Last 3 Encounters:  02/08/18 173 lb 12.8 oz (78.8 kg)  01/03/18 177 lb 6.4 oz (80.5 kg)  12/14/17 178 lb 4.8 oz (80.9 kg)     Physical Exam Constitutional:      General: He is not in acute distress.    Appearance: He is well-developed.  HENT:     Head: Normocephalic and atraumatic.     Right Ear: External ear normal.     Left Ear: External ear normal.     Nose: Nose normal.  Eyes:     Conjunctiva/sclera: Conjunctivae normal.     Pupils: Pupils are equal, round, and reactive to light.  Neck:     Musculoskeletal: Normal range of motion and neck supple.  Cardiovascular:     Rate and Rhythm: Normal rate and regular rhythm.     Heart sounds: Normal heart sounds. No murmur.  Pulmonary:     Effort: Pulmonary effort is normal. No respiratory distress.     Breath sounds: Normal breath sounds. No wheezing or rales.  Abdominal:     Palpations: Abdomen is soft.     Tenderness: There is no abdominal tenderness.  Musculoskeletal: Normal range of motion.  Skin:    General: Skin is warm and dry.  Neurological:     Mental Status: He is alert and oriented to person, place, and time.     Deep Tendon Reflexes: Reflexes are normal and symmetric.  Psychiatric:        Behavior: Behavior normal.        Thought Content: Thought content normal.        Judgment: Judgment normal.       Assessment & Plan:   [name] was seen today for hypertension, hypothyroidism and establish care.  Diagnoses and all orders for this visit:  Hyperlipidemia, unspecified hyperlipidemia type -     CMP14+EGFR -     CBC with Differential/Platelet -      PSA, total and free  Benign prostatic hyperplasia, unspecified whether lower urinary tract symptoms present  Hypothyroidism, unspecified type -     TSH -     T4, Free  Depression, major, single episode, moderate (HCC)  Weight loss, non-intentional       I am having [name] maintain his multivitamin, Vitamin D, aspirin, Budesonide (RHINOCORT AQUA NA), ALPRAZolam, mirtazapine, chlordiazePOXIDE, levothyroxine, triamcinolone ointment, simvastatin, FLOVENT HFA, and omeprazole.  Allergies as of 02/08/2018      Reactions   Celexa [citalopram Hydrobromide]    Panic attacks   Cymbalta [duloxetine Hcl]  Panic / anxiety attacks   Procardia [nifedipine] Other (See Comments)   Weakness   Wellbutrin [bupropion] Anxiety   Panic attacks      Medication List       Accurate as of February 08, 2018  3:30 PM. Always use your most recent med list.        ALPRAZolam 0.5 MG tablet Commonly known as:  XANAX TAKE TWO TABLETS BY MOUTH THREE TIMES DAILY (8AM, 2PM, AND 8PM)   aspirin 81 MG tablet Take 81 mg by mouth daily.   chlordiazePOXIDE 5 MG capsule Commonly known as:  LIBRIUM Take 15 mg by mouth at bedtime.   FLOVENT HFA 110 MCG/ACT inhaler Generic drug:  fluticasone INHALE TWO PUFFS TWICE DAILY TO PREVENT COUGH OR WHEEZE. RINSE MOUTH AFTER USE.   levothyroxine 88 MCG tablet Commonly known as:  SYNTHROID, LEVOTHROID TAKE 1 TABLET BY MOUTH EVERY DAY   mirtazapine 45 MG tablet Commonly known as:  REMERON Take 45 mg by mouth at bedtime.   multivitamin tablet Take 1 tablet by mouth daily.   omeprazole 20 MG capsule Commonly known as:  PRILOSEC TAKE 1 CAPSULE (20 MG TOTAL) BY MOUTH 2 (TWO) TIMES DAILY BEFORE A MEAL.   RHINOCORT AQUA NA Place 1 puff into the nose. Three times a week   simvastatin 20 MG tablet Commonly known as:  ZOCOR TAKE 1 TABLET BY MOUTH EVERYDAY AT BEDTIME   triamcinolone ointment 0.5 % Commonly known as:  KENALOG Apply 1 application  topically 2 (two) times daily.   Vitamin D 50 MCG (2000 UT) Caps Take 1 capsule by mouth daily.      More than 40 minutes was spent with this patient most of which was in discussion of his various medical concerns.  Particularly weight loss and his other situations as they relate to it.  Initially I suggested he have the EGD done.  He is going to follow-up with GI to schedule that since the hoarseness and weight loss persist.  Blood work today related to the thyroid should help as well as checking standard chemistries.  Should weight loss continue we will discuss at his follow-up what would be next  Follow-up: Return in about 6 weeks (around 03/22/2018).  Claretta Fraise, M.D.

## 2018-02-09 LAB — CMP14+EGFR
ALT: 23 IU/L (ref 0–44)
AST: 26 IU/L (ref 0–40)
Albumin/Globulin Ratio: 2.3 — ABNORMAL HIGH (ref 1.2–2.2)
Albumin: 5 g/dL — ABNORMAL HIGH (ref 3.5–4.8)
Alkaline Phosphatase: 81 IU/L (ref 39–117)
BUN/Creatinine Ratio: 18 (ref 10–24)
BUN: 16 mg/dL (ref 8–27)
Bilirubin Total: 0.6 mg/dL (ref 0.0–1.2)
CO2: 25 mmol/L (ref 20–29)
CREATININE: 0.87 mg/dL (ref 0.76–1.27)
Calcium: 9.4 mg/dL (ref 8.6–10.2)
Chloride: 101 mmol/L (ref 96–106)
GFR calc Af Amer: 97 mL/min/{1.73_m2} (ref >59)
GFR calc non Af Amer: 84 mL/min/{1.73_m2} (ref >59)
Globulin, Total: 2.2 g/dL (ref 1.5–4.5)
Glucose: 97 mg/dL (ref 65–99)
Potassium: 3.8 mmol/L (ref 3.5–5.2)
SODIUM: 141 mmol/L (ref 134–144)
Total Protein: 7.2 g/dL (ref 6.0–8.5)

## 2018-02-09 LAB — CBC WITH DIFFERENTIAL/PLATELET
Basophils Absolute: 0 10*3/uL (ref 0.0–0.2)
Basos: 1 %
EOS (ABSOLUTE): 0.2 10*3/uL (ref 0.0–0.4)
Eos: 2 %
Hematocrit: 42 % (ref 37.5–51.0)
Hemoglobin: 15 g/dL (ref 13.0–17.7)
Immature Grans (Abs): 0 10*3/uL (ref 0.0–0.1)
Immature Granulocytes: 0 %
Lymphocytes Absolute: 2.4 10*3/uL (ref 0.7–3.1)
Lymphs: 36 %
MCH: 31.7 pg (ref 26.6–33.0)
MCHC: 35.7 g/dL (ref 31.5–35.7)
MCV: 89 fL (ref 79–97)
Monocytes Absolute: 0.6 10*3/uL (ref 0.1–0.9)
Monocytes: 9 %
NEUTROS PCT: 52 %
Neutrophils Absolute: 3.5 10*3/uL (ref 1.4–7.0)
Platelets: 246 10*3/uL (ref 150–450)
RBC: 4.73 x10E6/uL (ref 4.14–5.80)
RDW: 12 % — AB (ref 12.3–15.4)
WBC: 6.7 10*3/uL (ref 3.4–10.8)

## 2018-02-09 LAB — TSH: TSH: 2.42 u[IU]/mL (ref 0.450–4.500)

## 2018-02-09 LAB — T4, FREE: Free T4: 1.44 ng/dL (ref 0.82–1.77)

## 2018-02-09 LAB — PSA, TOTAL AND FREE
PSA, Free Pct: 18.8 %
PSA, Free: 0.3 ng/mL
Prostate Specific Ag, Serum: 1.6 ng/mL (ref 0.0–4.0)

## 2018-02-09 NOTE — Progress Notes (Signed)
Hello [name],  Your lab result is normal.Some minor variations that are not significant are commonly marked abnormal, but do not represent any medical problem for you.  Best regards, Channel Papandrea, M.D.

## 2018-02-10 DIAGNOSIS — R350 Frequency of micturition: Secondary | ICD-10-CM | POA: Diagnosis not present

## 2018-03-08 ENCOUNTER — Other Ambulatory Visit: Payer: Self-pay | Admitting: *Deleted

## 2018-03-08 MED ORDER — LEVOTHYROXINE SODIUM 88 MCG PO TABS: 88.0000 ug | ORAL_TABLET | Freq: Every day | ORAL | 3 refills | Status: DC

## 2018-04-05 DIAGNOSIS — R350 Frequency of micturition: Secondary | ICD-10-CM | POA: Diagnosis not present

## 2018-05-09 ENCOUNTER — Other Ambulatory Visit: Payer: Self-pay | Admitting: Allergy and Immunology

## 2018-05-09 ENCOUNTER — Other Ambulatory Visit: Payer: Self-pay

## 2018-05-09 MED ORDER — FLUTICASONE PROPIONATE HFA 110 MCG/ACT IN AERO: INHALATION_SPRAY | RESPIRATORY_TRACT | 4 refills | Status: DC

## 2018-05-09 MED ORDER — OMEPRAZOLE 20 MG PO CPDR: 20.0000 mg | DELAYED_RELEASE_CAPSULE | Freq: Two times a day (BID) | ORAL | 1 refills | Status: DC

## 2018-05-09 NOTE — Telephone Encounter (Signed)
Patient called to push out his appointment in [facility] and needs a 90 supply of omeprazole sent into [facility] in [place].

## 2018-05-17 ENCOUNTER — Ambulatory Visit: Payer: Medicare Other | Admitting: Allergy and Immunology

## 2018-06-14 ENCOUNTER — Ambulatory Visit (INDEPENDENT_AMBULATORY_CARE_PROVIDER_SITE_OTHER): Payer: Medicare Other | Admitting: Allergy and Immunology

## 2018-06-14 ENCOUNTER — Encounter: Payer: Self-pay | Admitting: Allergy and Immunology

## 2018-06-14 ENCOUNTER — Other Ambulatory Visit: Payer: Self-pay

## 2018-06-14 DIAGNOSIS — J4530 Mild persistent asthma, uncomplicated: Secondary | ICD-10-CM | POA: Diagnosis not present

## 2018-06-14 DIAGNOSIS — J3089 Other allergic rhinitis: Secondary | ICD-10-CM

## 2018-06-14 DIAGNOSIS — J310 Chronic rhinitis: Secondary | ICD-10-CM

## 2018-06-14 DIAGNOSIS — K219 Gastro-esophageal reflux disease without esophagitis: Secondary | ICD-10-CM | POA: Diagnosis not present

## 2018-06-14 DIAGNOSIS — T485X5A Adverse effect of other anti-common-cold drugs, initial encounter: Principal | ICD-10-CM

## 2018-06-14 MED ORDER — ALBUTEROL SULFATE HFA 108 (90 BASE) MCG/ACT IN AERS: 2.0000 | INHALATION_SPRAY | RESPIRATORY_TRACT | 1 refills | Status: DC | PRN

## 2018-06-14 MED ORDER — FLUTICASONE PROPIONATE HFA 110 MCG/ACT IN AERO: INHALATION_SPRAY | RESPIRATORY_TRACT | 5 refills | Status: DC

## 2018-06-14 NOTE — Assessment & Plan Note (Signed)
Well-controlled.  For now, continue Flovent 110 g, 2 inhalations via spacer device daily.  During respiratory tract infections or asthma flares, increase Flovent 110g to 3 inhalations 2 times per day until symptoms have returned to baseline.  Albuterol HFA, 1 to 2 inhalations every 4-6 hours if needed.  Subjective and objective measures of pulmonary function will be followed and the treatment plan will be adjusted accordingly.

## 2018-06-14 NOTE — Assessment & Plan Note (Signed)
   Continue appropriate aero allergen avoidance measures.  For now, continue nasal saline irrigation as needed, Afrin at bedtime and Rhinocort AQ 3 times per week.

## 2018-06-14 NOTE — Progress Notes (Signed)
Follow-up Telemedicine Note  RE: HAO DION MRN: [id number] DOB: 1940/11/07 Date of Telemedicine Visit: 06/14/2018  Primary care provider: Claretta Fraise, MD Referring provider: Timmothy Euler, MD  Telemedicine Follow Up Visit via Telephone: I connected with Juanluis Guastella for a follow up on 06/14/18 by telephone and verified that I am speaking with the correct person using two identifiers.   The limitations, risks, security and privacy concerns of performing an evaluation and management service by telemedicine, the availability of in person appointments, and that there may be a patient responsible charge related to this service were discussed. The patient expressed understanding and agreed to proceed.  Patient is at home .  Provider is at the office.  Visit start time: 1:57 pm Visit end time: 2:40 pm Insurance consent/check in by: Anderson Malta Medical consent and medical assistant/nurse: Caryl Pina  History of present illness: Kalid Ghan is a 78 y.o. male with persistent asthma, allergic rhinitis, rhinitis medicamentosa, and gastroesophageal reflux disease presenting today via telemedicine for follow-up.  He was last seen in this clinic by Dr. Neldon Mc in September 2019.  He reports that in the interval since his previous visit his asthma has been well controlled with Flovent 110 micro grams, 2 inhalations via spacer device once daily.  He has not required asthma rescue medication, experienced nocturnal awakenings due to lower respiratory symptoms, nor have activities of daily living been limited.  He has been exercising 60 minutes/day on a Nordic track without lower respiratory symptoms. He reports that he typically only experiences asthma symptoms with respiratory tract infections, which he has not had in the interval since his last visit. His nasal allergy symptoms are well controlled with Afrin at bedtime and Rhinocort 3 times per week.  He states that he has used Afrin every night  for many years and has never experienced rebound because he limits his use to 1 time per day.  He is only experienced mild sinus pressure on rare occasions. He reports that his acid reflux is well controlled with omeprazole 20 mg twice daily.  He admits that on occasion he consumes chocolate chip cookies prior to bedtime but rarely experiences breakthrough symptoms.  Assessment and plan: Mild persistent asthma Well-controlled.  For now, continue Flovent 110 g, 2 inhalations via spacer device daily.  During respiratory tract infections or asthma flares, increase Flovent 110g to 3 inhalations 2 times per day until symptoms have returned to baseline.  Albuterol HFA, 1 to 2 inhalations every 4-6 hours if needed.  Subjective and objective measures of pulmonary function will be followed and the treatment plan will be adjusted accordingly.  Allergic rhinitis  Continue appropriate aero allergen avoidance measures.  For now, continue nasal saline irrigation as needed, Afrin at bedtime and Rhinocort AQ 3 times per week.  GERD (gastroesophageal reflux disease)  Reflux lifestyle modifications have been provided.  Continue omeprazole 20 mg twice daily for now.  If needed, may add famotidine (Pepcid) 20 mg 1-2 times daily.   Meds ordered this encounter  Medications  . DISCONTD: fluticasone (FLOVENT HFA) 110 MCG/ACT inhaler    Sig: INHALE TWO PUFFS TWICE DAILY TO PREVENT COUGH OR WHEEZE. RINSE MOUTH AFTER USE.    Dispense:  12 g    Refill:  5  . albuterol (VENTOLIN HFA) 108 (90 Base) MCG/ACT inhaler    Sig: Inhale 2 puffs into the lungs every 4 (four) hours as needed for wheezing or shortness of breath.    Dispense:  1 Inhaler  Refill:  1    Diagnostics: None.   Physical examination: Physical Exam Not obtained as encounter was done via telephone.   The following portions of the patient's history were reviewed and updated as appropriate: allergies, current medications, past  family history, past medical history, past social history, past surgical history and problem list.  Allergies as of 06/14/2018      Reactions   Celexa [citalopram Hydrobromide]    Panic attacks   Cymbalta [duloxetine Hcl]    Panic / anxiety attacks   Procardia [nifedipine] Other (See Comments)   Weakness   Wellbutrin [bupropion] Anxiety   Panic attacks      Medication List       Accurate as of June 14, 2018  3:45 PM. Always use your most recent med list.        albuterol 108 (90 Base) MCG/ACT inhaler Commonly known as:  VENTOLIN HFA Inhale 2 puffs into the lungs every 4 (four) hours as needed for wheezing or shortness of breath.   ALPRAZolam 0.5 MG tablet Commonly known as:  XANAX TAKE TWO TABLETS BY MOUTH THREE TIMES DAILY (8AM, 2PM, AND 8PM)   aspirin 81 MG tablet Take 81 mg by mouth daily.   chlordiazePOXIDE 5 MG capsule Commonly known as:  LIBRIUM Take 10 mg by mouth at bedtime.   levothyroxine 88 MCG tablet Commonly known as:  SYNTHROID Take 1 tablet (88 mcg total) by mouth daily.   mirtazapine 45 MG tablet Commonly known as:  REMERON Take 45 mg by mouth at bedtime.   multivitamin tablet Take 1 tablet by mouth daily.   omeprazole 20 MG capsule Commonly known as:  PRILOSEC Take 1 capsule (20 mg total) by mouth 2 (two) times daily before a meal.   RHINOCORT AQUA NA Place 1 puff into the nose. Three times a week   simvastatin 20 MG tablet Commonly known as:  ZOCOR TAKE 1 TABLET BY MOUTH EVERYDAY AT BEDTIME   Vitamin D 50 MCG (2000 UT) Caps Take 1 capsule by mouth daily.       Allergies  Allergen Reactions  . Celexa [Citalopram Hydrobromide]     Panic attacks  . Cymbalta [Duloxetine Hcl]     Panic / anxiety attacks  . Procardia [Nifedipine] Other (See Comments)    Weakness   . Wellbutrin [Bupropion] Anxiety    Panic attacks   Review of systems: Review of systems negative except as noted in HPI / PMHx or noted below: Constitutional:  Negative.  HENT: Negative.   Eyes: Negative.  Respiratory: Negative.   Cardiovascular: Negative.  Gastrointestinal: Negative.  Genitourinary: Negative.  Musculoskeletal: Negative.  Neurological: Negative.  Endo/Heme/Allergies: Negative.  Cutaneous: Negative.  Past Medical History:  Diagnosis Date  . Allergic rhinitis, cause unspecified   . Allergy    a lot seasonal allergies  . Anxiety   . Arthritis    degenerative arthritis of the neck  . Depression   . GERD (gastroesophageal reflux disease)   . Hyperlipidemia   . Hypothyroidism   . Irritable bowel syndrome   . Kidney stones    per pt, not sure if had kidney stones.  . Obesity   . Panic attacks 03/2015  . Shoulder pain, right   . Unspecified asthma(493.90)     Family History  Problem Relation Age of Onset  . Breast cancer Mother   . Kidney disease Mother   . Uterine cancer Mother   . Diabetes Father   . Heart disease Father  Social History   Socioeconomic History  . Marital status: Widowed    Spouse name: Not on file  . Number of children: Not on file  . Years of education: Not on file  . Highest education level: Not on file  Occupational History  . Occupation: retired  Scientific laboratory technician  . Financial resource strain: Not on file  . Food insecurity:    Worry: Not on file    Inability: Not on file  . Transportation needs:    Medical: Not on file    Non-medical: Not on file  Tobacco Use  . Smoking status: Former Smoker    Packs/day: 2.00    Years: 2.00    Pack years: 4.00    Types: Cigarettes    Last attempt to quit: 02/23/1969    Years since quitting: 49.3  . Smokeless tobacco: Never Used  Substance and Sexual Activity  . Alcohol use: No    Alcohol/week: 0.0 standard drinks  . Drug use: No  . Sexual activity: Not on file  Lifestyle  . Physical activity:    Days per week: Not on file    Minutes per session: Not on file  . Stress: Not on file  Relationships  . Social connections:    Talks on  phone: Not on file    Gets together: Not on file    Attends religious service: Not on file    Active member of club or organization: Not on file    Attends meetings of clubs or organizations: Not on file    Relationship status: Not on file  . Intimate partner violence:    Fear of current or ex partner: Not on file    Emotionally abused: Not on file    Physically abused: Not on file    Forced sexual activity: Not on file  Other Topics Concern  . Not on file  Social History Narrative  . Not on file     Previous notes and tests were reviewed.  I discussed the assessment and treatment plan with the patient. The patient was provided an opportunity to ask questions and all were answered. The patient agreed with the plan and demonstrated an understanding of the instructions.   The patient was advised to call back or seek an in-person evaluation if the symptoms worsen or if the condition fails to improve as anticipated.  I provided 43 minutes of non-face-to-face time during this encounter.  I appreciate the opportunity to take part in Lennox's care. Please do not hesitate to contact me with questions.  Sincerely,   R. Edgar Frisk, MD

## 2018-06-14 NOTE — Assessment & Plan Note (Signed)
   Reflux lifestyle modifications have been provided.  Continue omeprazole 20 mg twice daily for now.  If needed, may add famotidine (Pepcid) 20 mg 1-2 times daily.

## 2018-06-14 NOTE — Patient Instructions (Addendum)
Mild persistent asthma Well-controlled.  For now, continue Flovent 110 g, 2 inhalations via spacer device daily.  During respiratory tract infections or asthma flares, increase Flovent 110g to 3 inhalations 2 times per day until symptoms have returned to baseline.  Albuterol HFA, 1 to 2 inhalations every 4-6 hours if needed.  Subjective and objective measures of pulmonary function will be followed and the treatment plan will be adjusted accordingly.  Allergic rhinitis  Continue appropriate aero allergen avoidance measures.  For now, continue nasal saline irrigation as needed, Afrin at bedtime and Rhinocort AQ 3 times per week.  GERD (gastroesophageal reflux disease)  Reflux lifestyle modifications have been provided.  Continue omeprazole 20 mg twice daily for now.  If needed, may add famotidine (Pepcid) 20 mg 1-2 times daily.   Return in about 6 months (around 12/14/2018), or if symptoms worsen or fail to improve.  Lifestyle Changes for Controlling GERD  When you have GERD, stomach acid feels as if it's backing up toward your mouth. Whether or not you take medication to control your GERD, your symptoms can often be improved with lifestyle changes.   Raise Your Head  Reflux is more likely to strike when you're lying down flat, because stomach fluid can  flow backward more easily. Raising the head of your bed 4-6 inches can help. To do this:  Slide blocks or books under the legs at the head of your bed. Or, place a wedge under  the mattress. Many foam stores can make a suitable wedge for you. The wedge  should run from your waist to the top of your head.  Don't just prop your head on several pillows. This increases pressure on your  stomach. It can make GERD worse.  Watch Your Eating Habits Certain foods may increase the acid in your stomach or relax the lower esophageal sphincter, making GERD more likely. It's best to avoid the following:  Coffee, tea, and  carbonated drinks (with and without caffeine)  Fatty, fried, or spicy food  Mint, chocolate, onions, and tomatoes  Any other foods that seem to irritate your stomach or cause you pain  Relieve the Pressure  Eat smaller meals, even if you have to eat more often.  Don't lie down right after you eat. Wait a few hours for your stomach to empty.  Avoid tight belts and tight-fitting clothes.  Lose excess weight.  Tobacco and Alcohol  Avoid smoking tobacco and drinking alcohol. They can make GERD symptoms worse.

## 2018-08-12 ENCOUNTER — Ambulatory Visit: Payer: Medicare Other | Admitting: Family Medicine

## 2018-08-16 ENCOUNTER — Other Ambulatory Visit: Payer: Self-pay

## 2018-08-17 ENCOUNTER — Ambulatory Visit (INDEPENDENT_AMBULATORY_CARE_PROVIDER_SITE_OTHER): Payer: Medicare Other | Admitting: Family Medicine

## 2018-08-17 ENCOUNTER — Encounter: Payer: Self-pay | Admitting: Family Medicine

## 2018-08-17 VITALS — BP 147/92 | HR 76 | Temp 98.1°F | Ht 69.0 in | Wt 173.0 lb

## 2018-08-17 DIAGNOSIS — E785 Hyperlipidemia, unspecified: Secondary | ICD-10-CM | POA: Diagnosis not present

## 2018-08-17 DIAGNOSIS — E039 Hypothyroidism, unspecified: Secondary | ICD-10-CM

## 2018-08-17 DIAGNOSIS — N400 Enlarged prostate without lower urinary tract symptoms: Secondary | ICD-10-CM | POA: Diagnosis not present

## 2018-08-17 DIAGNOSIS — K219 Gastro-esophageal reflux disease without esophagitis: Secondary | ICD-10-CM | POA: Diagnosis not present

## 2018-08-17 LAB — URINALYSIS
Bilirubin, UA: NEGATIVE
Glucose, UA: NEGATIVE
Ketones, UA: NEGATIVE
Leukocytes,UA: NEGATIVE
Nitrite, UA: NEGATIVE
Protein,UA: NEGATIVE
RBC, UA: NEGATIVE
Specific Gravity, UA: 1.02 (ref 1.005–1.030)
Urobilinogen, Ur: 0.2 mg/dL (ref 0.2–1.0)
pH, UA: 5.5 (ref 5.0–7.5)

## 2018-08-17 MED ORDER — MIRTAZAPINE 45 MG PO TABS: 45.0000 mg | ORAL_TABLET | Freq: Every day | ORAL | 1 refills | Status: DC

## 2018-08-17 MED ORDER — CHLORDIAZEPOXIDE HCL 5 MG PO CAPS: 10.0000 mg | ORAL_CAPSULE | Freq: Every day | ORAL | 5 refills | Status: DC

## 2018-08-17 MED ORDER — OMEPRAZOLE 20 MG PO CPDR: 20.0000 mg | DELAYED_RELEASE_CAPSULE | Freq: Two times a day (BID) | ORAL | 1 refills | Status: DC

## 2018-08-17 MED ORDER — LEVOTHYROXINE SODIUM 88 MCG PO TABS: 88.0000 ug | ORAL_TABLET | Freq: Every day | ORAL | 3 refills | Status: DC

## 2018-08-17 MED ORDER — SIMVASTATIN 20 MG PO TABS: ORAL_TABLET | ORAL | 1 refills | Status: DC

## 2018-08-17 NOTE — Progress Notes (Signed)
Subjective:  Patient ID: [name], male    DOB: 17-Sep-1940  Age: 78 y.o. MRN: [id number]  CC: Medical Management of Chronic Issues   HPI [name] presents for Patient presents for follow-up on  thyroid. The patient has a history of hypothyroidism for many years. It has been stable recently. Pt. denies any change in  voice, loss of hair, heat or cold intolerance. Energy level has been adequate to good. Patient denies constipation and diarrhea. No myxedema. Medication is as noted below. Verified that pt is taking it daily on an empty stomach. Well tolerated.  Patient in for follow-up of elevated cholesterol. Doing well without complaints on current medication. Denies side effects of statin including myalgia and arthralgia and nausea. Also in today for liver function testing. Currently no chest pain, shortness of breath or other cardiovascular related symptoms noted.  Patient in for follow-up of GERD. Currently asymptomatic taking  PPI daily. There is no chest pain or heartburn. No hematemesis and no melena. No dysphagia or choking. Onset is remote. Progression is stable. Complicating factors, none.    Depression screen [facility] 2/9 08/17/2018 02/08/2018 01/03/2018  Decreased Interest 0 1 0  Down, Depressed, Hopeless 0 1 0  PHQ - 2 Score 0 2 0  Altered sleeping - 0 -  Tired, decreased energy - 0 -  Change in appetite - 0 -  Feeling bad or failure about yourself  - 0 -  Trouble concentrating - 0 -  Moving slowly or fidgety/restless - 0 -  Suicidal thoughts - 0 -  PHQ-9 Score - 2 -  Difficult doing work/chores - - -  Some recent data might be hidden    History Oiva has a past medical history of Allergic rhinitis, cause unspecified, Allergy, Anxiety, Arthritis, Depression, GERD (gastroesophageal reflux disease), Hyperlipidemia, Hypothyroidism, Irritable bowel syndrome, Kidney stones, Obesity, Panic attacks (03/2015), Shoulder pain, right, and Unspecified asthma(493.90).   He has  a past surgical history that includes Tonsillectomy; Vasectomy; Rotator cuff repair (2005); Cataract extraction bilateral w/ anterior vitrectomy (2005); and Transurethral resection of prostate (2005).   His family history includes Breast cancer in his mother; Diabetes in his father; Heart disease in his father; Kidney disease in his mother; Uterine cancer in his mother.He reports that he quit smoking about 49 years ago. His smoking use included cigarettes. He has a 4.00 pack-year smoking history. He has never used smokeless tobacco. He reports that he does not drink alcohol or use drugs.    ROS Review of Systems  Constitutional: Negative.   HENT: Negative.   Eyes: Negative for visual disturbance.  Respiratory: Negative for cough and shortness of breath.   Cardiovascular: Negative for chest pain and leg swelling.  Gastrointestinal: Negative for abdominal pain, diarrhea, nausea and vomiting.  Genitourinary: Positive for frequency. Negative for difficulty urinating and dysuria.  Musculoskeletal: Negative for arthralgias and myalgias.  Skin: Negative for rash.  Neurological: Negative for headaches.  Psychiatric/Behavioral: Negative for sleep disturbance.    Objective:  BP (!) 147/92   Pulse 76   Temp 98.1 F (36.7 C) (Oral)   Ht '5\' 9"'  (1.753 m)   Wt 173 lb (78.5 kg)   BMI 25.55 kg/m   BP Readings from Last 3 Encounters:  08/17/18 (!) 147/92  02/08/18 132/89  01/03/18 133/84    Wt Readings from Last 3 Encounters:  08/17/18 173 lb (78.5 kg)  02/08/18 173 lb 12.8 oz (78.8 kg)  01/03/18 177 lb 6.4 oz (80.5 kg)  Physical Exam Vitals signs reviewed.  Constitutional:      Appearance: He is well-developed.  HENT:     Head: Normocephalic and atraumatic.     Right Ear: Tympanic membrane and external ear normal. No decreased hearing noted.     Left Ear: Tympanic membrane and external ear normal. No decreased hearing noted.     Mouth/Throat:     Pharynx: No oropharyngeal  exudate or posterior oropharyngeal erythema.  Eyes:     Pupils: Pupils are equal, round, and reactive to light.  Neck:     Musculoskeletal: Normal range of motion and neck supple.  Cardiovascular:     Rate and Rhythm: Normal rate and regular rhythm.     Heart sounds: No murmur.  Pulmonary:     Effort: No respiratory distress.     Breath sounds: Normal breath sounds.  Abdominal:     General: Bowel sounds are normal.     Palpations: Abdomen is soft. There is no mass.     Tenderness: There is no abdominal tenderness.       Assessment & Plan:   Brigg was seen today for medical management of chronic issues.  Diagnoses and all orders for this visit:  Hypothyroidism, unspecified type -     CBC with Differential/Platelet -     Thyroid Panel With TSH -     T4, Free  Gastroesophageal reflux disease, esophagitis presence not specified  Hyperlipidemia, unspecified hyperlipidemia type -     CMP14+EGFR -     Lipid panel  Benign prostatic hyperplasia, unspecified whether lower urinary tract symptoms present -     Urinalysis  Other orders -     simvastatin (ZOCOR) 20 MG tablet; TAKE 1 TABLET BY MOUTH EVERYDAY AT BEDTIME -     chlordiazePOXIDE (LIBRIUM) 5 MG capsule; Take 2 capsules (10 mg total) by mouth at bedtime. -     omeprazole (PRILOSEC) 20 MG capsule; Take 1 capsule (20 mg total) by mouth 2 (two) times daily before a meal. -     mirtazapine (REMERON) 45 MG tablet; Take 1 tablet (45 mg total) by mouth at bedtime. -     levothyroxine (SYNTHROID) 88 MCG tablet; Take 1 tablet (88 mcg total) by mouth daily. -     ALPRAZolam (XANAX) 0.5 MG tablet; TAKE TWO TABLETS BY MOUTH THREE TIMES DAILY (8AM, 2PM, AND 8PM)       I have changed [name]'s chlordiazePOXIDE, mirtazapine, and levothyroxine. I am also having him maintain his multivitamin, Vitamin D, aspirin, Budesonide (RHINOCORT AQUA NA), albuterol, simvastatin, omeprazole, and ALPRAZolam.  Allergies as of 08/17/2018       Reactions   Celexa [citalopram Hydrobromide]    Panic attacks   Cymbalta [duloxetine Hcl]    Panic / anxiety attacks   Procardia [nifedipine] Other (See Comments)   Weakness   Wellbutrin [bupropion] Anxiety   Panic attacks      Medication List       Accurate as of August 17, 2018 11:59 PM. If you have any questions, ask your nurse or doctor.        albuterol 108 (90 Base) MCG/ACT inhaler Commonly known as: VENTOLIN HFA Inhale 2 puffs into the lungs every 4 (four) hours as needed for wheezing or shortness of breath.   ALPRAZolam 0.5 MG tablet Commonly known as: XANAX TAKE TWO TABLETS BY MOUTH THREE TIMES DAILY (8AM, 2PM, AND 8PM) What changed:   how much to take  additional instructions   aspirin 81 MG  tablet Take 81 mg by mouth daily.   chlordiazePOXIDE 5 MG capsule Commonly known as: LIBRIUM Take 2 capsules (10 mg total) by mouth at bedtime.   levothyroxine 88 MCG tablet Commonly known as: SYNTHROID Take 1 tablet (88 mcg total) by mouth daily.   mirtazapine 45 MG tablet Commonly known as: REMERON Take 1 tablet (45 mg total) by mouth at bedtime.   multivitamin tablet Take 1 tablet by mouth daily.   omeprazole 20 MG capsule Commonly known as: PRILOSEC Take 1 capsule (20 mg total) by mouth 2 (two) times daily before a meal.   RHINOCORT AQUA NA Place 1 puff into the nose. Three times a week   simvastatin 20 MG tablet Commonly known as: ZOCOR TAKE 1 TABLET BY MOUTH EVERYDAY AT BEDTIME   Vitamin D 50 MCG (2000 UT) Caps Take 1 capsule by mouth daily.        Follow-up: Return in about 6 months (around 02/16/2019).  Claretta Fraise, M.D.

## 2018-08-18 LAB — THYROID PANEL WITH TSH
Free Thyroxine Index: 2.3 (ref 1.2–4.9)
T3 Uptake Ratio: 25 % (ref 24–39)
T4, Total: 9.3 ug/dL (ref 4.5–12.0)
TSH: 1.55 u[IU]/mL (ref 0.450–4.500)

## 2018-08-18 LAB — CMP14+EGFR
ALT: 20 IU/L (ref 0–44)
AST: 24 IU/L (ref 0–40)
Albumin/Globulin Ratio: 2.2 (ref 1.2–2.2)
Albumin: 4.7 g/dL (ref 3.7–4.7)
Alkaline Phosphatase: 75 IU/L (ref 39–117)
BUN/Creatinine Ratio: 18 (ref 10–24)
BUN: 14 mg/dL (ref 8–27)
Bilirubin Total: 0.6 mg/dL (ref 0.0–1.2)
CO2: 25 mmol/L (ref 20–29)
Calcium: 9.1 mg/dL (ref 8.6–10.2)
Chloride: 104 mmol/L (ref 96–106)
Creatinine, Ser: 0.76 mg/dL (ref 0.76–1.27)
GFR calc Af Amer: 102 mL/min/{1.73_m2} (ref >59)
GFR calc non Af Amer: 89 mL/min/{1.73_m2} (ref >59)
Globulin, Total: 2.1 g/dL (ref 1.5–4.5)
Glucose: 102 mg/dL — ABNORMAL HIGH (ref 65–99)
Potassium: 3.9 mmol/L (ref 3.5–5.2)
Sodium: 143 mmol/L (ref 134–144)
Total Protein: 6.8 g/dL (ref 6.0–8.5)

## 2018-08-18 LAB — CBC WITH DIFFERENTIAL/PLATELET
Basophils Absolute: 0 10*3/uL (ref 0.0–0.2)
Basos: 1 %
EOS (ABSOLUTE): 0.2 10*3/uL (ref 0.0–0.4)
Eos: 3 %
Hematocrit: 41.2 % (ref 37.5–51.0)
Hemoglobin: 14.3 g/dL (ref 13.0–17.7)
Immature Grans (Abs): 0 10*3/uL (ref 0.0–0.1)
Immature Granulocytes: 0 %
Lymphocytes Absolute: 2 10*3/uL (ref 0.7–3.1)
Lymphs: 37 %
MCH: 30.4 pg (ref 26.6–33.0)
MCHC: 34.7 g/dL (ref 31.5–35.7)
MCV: 88 fL (ref 79–97)
Monocytes Absolute: 0.4 10*3/uL (ref 0.1–0.9)
Monocytes: 8 %
Neutrophils Absolute: 2.7 10*3/uL (ref 1.4–7.0)
Neutrophils: 51 %
Platelets: 221 10*3/uL (ref 150–450)
RBC: 4.7 x10E6/uL (ref 4.14–5.80)
RDW: 12.1 % (ref 11.6–15.4)
WBC: 5.3 10*3/uL (ref 3.4–10.8)

## 2018-08-18 LAB — LIPID PANEL
Chol/HDL Ratio: 3.3 ratio (ref 0.0–5.0)
Cholesterol, Total: 143 mg/dL (ref 100–199)
HDL: 43 mg/dL (ref >39)
LDL Calculated: 77 mg/dL (ref 0–99)
Triglycerides: 113 mg/dL (ref 0–149)
VLDL Cholesterol Cal: 23 mg/dL (ref 5–40)

## 2018-08-18 LAB — T4, FREE: Free T4: 1.29 ng/dL (ref 0.82–1.77)

## 2018-08-19 NOTE — Progress Notes (Signed)
Hello [name],  Your lab result is normal.Some minor variations that are not significant are commonly marked abnormal, but do not represent any medical problem for you.  Best regards, Claretta Fraise, M.D.

## 2018-08-28 ENCOUNTER — Encounter: Payer: Self-pay | Admitting: Family Medicine

## 2018-08-28 MED ORDER — ALPRAZOLAM 0.5 MG PO TABS: ORAL_TABLET | ORAL | 1 refills | Status: DC

## 2018-10-10 DIAGNOSIS — H26492 Other secondary cataract, left eye: Secondary | ICD-10-CM | POA: Diagnosis not present

## 2018-11-25 ENCOUNTER — Encounter: Payer: Self-pay | Admitting: *Deleted

## 2018-12-20 ENCOUNTER — Encounter: Payer: Self-pay | Admitting: Allergy and Immunology

## 2018-12-20 ENCOUNTER — Ambulatory Visit (INDEPENDENT_AMBULATORY_CARE_PROVIDER_SITE_OTHER): Payer: Medicare Other | Admitting: Allergy and Immunology

## 2018-12-20 ENCOUNTER — Other Ambulatory Visit: Payer: Self-pay

## 2018-12-20 VITALS — BP 120/78 | HR 82 | Temp 98.3°F | Resp 16 | Ht 68.31 in | Wt 174.2 lb

## 2018-12-20 DIAGNOSIS — J3089 Other allergic rhinitis: Secondary | ICD-10-CM

## 2018-12-20 DIAGNOSIS — T485X5A Adverse effect of other anti-common-cold drugs, initial encounter: Secondary | ICD-10-CM | POA: Diagnosis not present

## 2018-12-20 DIAGNOSIS — J310 Chronic rhinitis: Secondary | ICD-10-CM

## 2018-12-20 DIAGNOSIS — K219 Gastro-esophageal reflux disease without esophagitis: Secondary | ICD-10-CM

## 2018-12-20 DIAGNOSIS — J4530 Mild persistent asthma, uncomplicated: Secondary | ICD-10-CM | POA: Diagnosis not present

## 2018-12-20 NOTE — Patient Instructions (Addendum)
Mild persistent asthma Well-controlled, we will stepdown therapy at this time.  Decrease Flovent 110 g, to 1 inhalation via spacer device daily.  During respiratory tract infections or asthma flares, increase Flovent 110g to 2 inhalations via spacer device 2 times per day until symptoms have returned to baseline.  Albuterol HFA, 1 to 2 inhalations every 4-6 hours if needed.  Subjective and objective measures of pulmonary function will be followed and the treatment plan will be adjusted accordingly.  Allergic rhinitis Stable.  Continue appropriate aeroallergen avoidance measures.  For now, continue nasal saline irrigation as needed, Afrin at bedtime and Rhinocort AQ 3 times per week.  GERD (gastroesophageal reflux disease) Stable.  Continue appropriate reflux lifestyle modifications and omeprazole 20 mg twice daily.   Return in about 6 months (around 06/20/2019), or if symptoms worsen or fail to improve.

## 2018-12-20 NOTE — Progress Notes (Signed)
Follow-up Note  RE: [name] MRN: [id number] DOB: [birth year] Date of Office Visit: 12/20/2018  Primary care provider: Claretta Fraise, MD Referring provider: Claretta Fraise, MD  History of present illness: [name] is a 78 y.o. male with persistent asthma, allergic rhinitis, rhinitis medicamentosa, and gastroesophageal reflux presenting today for follow-up.  He was last evaluated in this clinic via telemedicine on June 14, 2018.  He reports that while taking Flovent 110 g, 2 inhalations via spacer device daily in the interval since his previous visit he has not required asthma rescue medication, experienced nocturnal awakenings due to lower respiratory symptoms, nor have activities of daily living been limited.  He is hesitant to discontinue the inhaled steroid altogether because of COVID-19.  He reports that he takes Afrin every night and Nasacort AQ 3 times per week.  While on this regimen, his nasal symptoms are stable.  He experiences nasal congestion at night which is relieved with Afrin, however denies oxymetazoline rebound congestion.  He has taken Nasacort AQ 3 nights per week over the past few years to prevent developing rebound congestion from oxymetazoline. [name] reports that he experiences mild acid reflux once every 2 weeks despite taking omeprazole daily, however is not interested in adding more antacid medication.  Assessment and plan: Mild persistent asthma Well-controlled, we will stepdown therapy at this time.  Decrease Flovent 110 g, to 1 inhalation via spacer device daily.  During respiratory tract infections or asthma flares, increase Flovent 110g to 2 inhalations via spacer device 2 times per day until symptoms have returned to baseline.  Albuterol HFA, 1 to 2 inhalations every 4-6 hours if needed.  Subjective and objective measures of pulmonary function will be followed and the treatment plan will be adjusted accordingly.  Allergic rhinitis Stable.   Continue appropriate aeroallergen avoidance measures.  For now, continue nasal saline irrigation as needed, Afrin at bedtime and Rhinocort AQ 3 times per week.  GERD (gastroesophageal reflux disease) Stable.  Continue appropriate reflux lifestyle modifications and omeprazole 20 mg twice daily.   Diagnostics: Spirometry:  Normal with an FEV1 of 125% predicted. This study was performed while the patient was asymptomatic.  Please see scanned spirometry results for details.    Physical examination: Blood pressure 120/78, pulse 82, temperature 98.3 F (36.8 C), temperature source Temporal, resp. rate 16, height 5' 8.31" (1.735 m), weight 174 lb 4 oz (79 kg), SpO2 95 %.  General: Alert, interactive, in no acute distress. HEENT: TMs pearly gray, turbinates mildly edematous without discharge, post-pharynx mildly erythematous. Neck: Supple without lymphadenopathy. Lungs: Clear to auscultation without wheezing, rhonchi or rales. CV: Normal S1, S2 without murmurs. Skin: Warm and dry, without lesions or rashes.  The following portions of the patient's history were reviewed and updated as appropriate: allergies, current medications, past family history, past medical history, past social history, past surgical history and problem list.  Current Outpatient Medications  Medication Sig Dispense Refill  . ALPRAZolam (XANAX) 0.5 MG tablet TAKE TWO TABLETS BY MOUTH THREE TIMES DAILY (8AM, 2PM, AND 8PM) (Patient taking differently: Take 0.25 mg by mouth at bedtime as needed. TAKE TWO TABLETS BY MOUTH THREE TIMES DAILY (8AM, 2PM, AND 8PM)) 180 tablet 1  . aspirin 81 MG tablet Take 81 mg by mouth daily.    . Budesonide (RHINOCORT AQUA NA) Place 1 puff into the nose. Three times a week    . chlordiazePOXIDE (LIBRIUM) 5 MG capsule Take 2 capsules (10 mg total) by mouth at bedtime. [place]  capsule 5  . Cholecalciferol (VITAMIN D) 2000 UNITS CAPS Take 1 capsule by mouth daily.      Marland Kitchen FLOVENT HFA 110 MCG/ACT  inhaler Inhale 2 puffs into the lungs 2 (two) times daily.    Marland Kitchen levothyroxine (SYNTHROID) 88 MCG tablet Take 1 tablet (88 mcg total) by mouth daily. 90 tablet 3  . mirtazapine (REMERON) 45 MG tablet Take 1 tablet (45 mg total) by mouth at bedtime. 90 tablet 1  . Multiple Vitamin (MULTIVITAMIN) tablet Take 1 tablet by mouth daily.      Marland Kitchen omeprazole (PRILOSEC) 20 MG capsule Take 1 capsule (20 mg total) by mouth 2 (two) times daily before a meal. 180 capsule 1  . simvastatin (ZOCOR) 20 MG tablet TAKE 1 TABLET BY MOUTH EVERYDAY AT BEDTIME 90 tablet 1  . albuterol (VENTOLIN HFA) 108 (90 Base) MCG/ACT inhaler Inhale 2 puffs into the lungs every 4 (four) hours as needed for wheezing or shortness of breath. (Patient not taking: Reported on 12/20/2018) 1 Inhaler 1   No current facility-administered medications for this visit.     Allergies  Allergen Reactions  . Celexa [Citalopram Hydrobromide]     Panic attacks  . Cymbalta [Duloxetine Hcl]     Panic / anxiety attacks  . Procardia [Nifedipine] Other (See Comments)    Weakness   . Wellbutrin [Bupropion] Anxiety    Panic attacks   Review of systems: Review of systems negative except as noted in HPI / PMHx or noted below: Constitutional: Negative.  HENT: Negative.   Eyes: Negative.  Respiratory: Negative.   Cardiovascular: Negative.  Gastrointestinal: Negative.  Genitourinary: Negative.  Musculoskeletal: Negative.  Neurological: Negative.  Endo/Heme/Allergies: Negative.  Cutaneous: Negative.    Past Medical History:  Diagnosis Date  . Allergic rhinitis, cause unspecified   . Allergy    a lot seasonal allergies  . Anxiety   . Arthritis    degenerative arthritis of the neck  . Depression   . GERD (gastroesophageal reflux disease)   . Hyperlipidemia   . Hypothyroidism   . Irritable bowel syndrome   . Kidney stones    per pt, not sure if had kidney stones.  . Obesity   . Panic attacks 03/2015  . Shoulder pain, right   .  Unspecified asthma(493.90)     Family History  Problem Relation Age of Onset  . Breast cancer Mother   . Kidney disease Mother   . Uterine cancer Mother   . Diabetes Father   . Heart disease Father     Social History   Socioeconomic History  . Marital status: Widowed    Spouse name: Not on file  . Number of children: Not on file  . Years of education: Not on file  . Highest education level: Not on file  Occupational History  . Occupation: retired  Scientific laboratory technician  . Financial resource strain: Not on file  . Food insecurity    Worry: Not on file    Inability: Not on file  . Transportation needs    Medical: Not on file    Non-medical: Not on file  Tobacco Use  . Smoking status: Former Smoker    Packs/day: 2.00    Years: 2.00    Pack years: 4.00    Types: Cigarettes    Quit date: 02/23/1969    Years since quitting: 49.8  . Smokeless tobacco: Never Used  Substance and Sexual Activity  . Alcohol use: No    Alcohol/week: 0.0 standard drinks  .  Drug use: No  . Sexual activity: Not on file  Lifestyle  . Physical activity    Days per week: Not on file    Minutes per session: Not on file  . Stress: Not on file  Relationships  . Social Herbalist on phone: Not on file    Gets together: Not on file    Attends religious service: Not on file    Active member of club or organization: Not on file    Attends meetings of clubs or organizations: Not on file    Relationship status: Not on file  . Intimate partner violence    Fear of current or ex partner: Not on file    Emotionally abused: Not on file    Physically abused: Not on file    Forced sexual activity: Not on file  Other Topics Concern  . Not on file  Social History Narrative  . Not on file    I appreciate the opportunity to take part in Arael's care. Please do not hesitate to contact me with questions.  Sincerely,   R. Edgar Frisk, MD

## 2018-12-20 NOTE — Assessment & Plan Note (Signed)
Stable.  Continue appropriate reflux lifestyle modifications and omeprazole 20 mg twice daily.

## 2018-12-20 NOTE — Assessment & Plan Note (Signed)
Well-controlled, we will stepdown therapy at this time.  Decrease Flovent 110 g, to 1 inhalation via spacer device daily.  During respiratory tract infections or asthma flares, increase Flovent 110g to 2 inhalations via spacer device 2 times per day until symptoms have returned to baseline.  Albuterol HFA, 1 to 2 inhalations every 4-6 hours if needed.  Subjective and objective measures of pulmonary function will be followed and the treatment plan will be adjusted accordingly.

## 2018-12-20 NOTE — Assessment & Plan Note (Addendum)
Stable.  Continue appropriate aeroallergen avoidance measures.  For now, continue nasal saline irrigation as needed, Afrin at bedtime and Rhinocort AQ 3 times per week.

## 2019-02-12 ENCOUNTER — Other Ambulatory Visit: Payer: Self-pay | Admitting: Family Medicine

## 2019-02-21 ENCOUNTER — Ambulatory Visit: Payer: Medicare Other | Admitting: Family Medicine

## 2019-02-27 ENCOUNTER — Telehealth: Payer: Self-pay | Admitting: Family Medicine

## 2019-02-27 ENCOUNTER — Other Ambulatory Visit: Payer: Self-pay

## 2019-02-28 ENCOUNTER — Encounter: Payer: Self-pay | Admitting: Family Medicine

## 2019-02-28 ENCOUNTER — Other Ambulatory Visit: Payer: Self-pay

## 2019-02-28 ENCOUNTER — Ambulatory Visit (INDEPENDENT_AMBULATORY_CARE_PROVIDER_SITE_OTHER): Payer: Medicare Other | Admitting: Family Medicine

## 2019-02-28 VITALS — BP 145/98 | HR 69 | Temp 98.6°F | Ht 68.3 in | Wt 173.6 lb

## 2019-02-28 DIAGNOSIS — E782 Mixed hyperlipidemia: Secondary | ICD-10-CM

## 2019-02-28 DIAGNOSIS — E039 Hypothyroidism, unspecified: Secondary | ICD-10-CM

## 2019-02-28 MED ORDER — SIMVASTATIN 20 MG PO TABS: ORAL_TABLET | ORAL | 1 refills | Status: DC

## 2019-02-28 MED ORDER — LEVOTHYROXINE SODIUM 88 MCG PO TABS: 88.0000 ug | ORAL_TABLET | Freq: Every day | ORAL | 3 refills | Status: DC

## 2019-02-28 NOTE — Progress Notes (Signed)
Subjective:  Patient ID: [name], male    DOB: October 20, 1940  Age: 79 y.o. MRN: [id number]  CC: Medical Management of Chronic Issues   HPI [name] presents for follow-up of elevated cholesterol. Doing well without complaints on current medication. Denies side effects of statin including myalgia and arthralgia and nausea. Also in today for liver function testing. Currently no chest pain, shortness of breath or other cardiovascular related symptoms noted.  follow-up on  thyroid. The patient has a history of hypothyroidism for many years. It has been stable recently. Pt. denies any change in  voice, loss of hair, heat or cold intolerance. Energy level has been adequate to good. Patient denies constipation and diarrhea. No myxedema. Medication is as noted below. Verified that pt is taking it daily on an empty stomach. Well tolerated.  History [name] has a past medical history of Allergic rhinitis, cause unspecified, Allergy, Anxiety, Arthritis, Depression, GERD (gastroesophageal reflux disease), Hyperlipidemia, Hypothyroidism, Irritable bowel syndrome, Kidney stones, Obesity, Panic attacks (03/2015), Shoulder pain, right, and Unspecified asthma(493.90).   [name] has a past surgical history that includes Tonsillectomy; Vasectomy; Rotator cuff repair (2005); Cataract extraction bilateral w/ anterior vitrectomy (2005); and Transurethral resection of prostate (2005).   His family history includes Breast cancer in his mother; Diabetes in his father; Heart disease in his father; Kidney disease in his mother; Uterine cancer in his mother.[name] reports that [name] quit smoking about 50 years ago. His smoking use included cigarettes. [name] has a 4.00 pack-year smoking history. [name] has never used smokeless tobacco. [name] reports that [name] does not drink alcohol or use drugs.  Current Outpatient Medications on File Prior to Visit  Medication Sig Dispense Refill  . aspirin 81 MG tablet Take 81 mg by mouth daily.    .  Budesonide (RHINOCORT AQUA NA) Place 1 puff into the nose. Three times a week    . chlordiazePOXIDE (LIBRIUM) 5 MG capsule Take 2 capsules (10 mg total) by mouth at bedtime. 30 capsule 5  . Cholecalciferol (VITAMIN D) 2000 UNITS CAPS Take 1 capsule by mouth daily.      Marland Kitchen FLOVENT HFA 110 MCG/ACT inhaler Inhale 2 puffs into the lungs 2 (two) times daily.    . Multiple Vitamin (MULTIVITAMIN) tablet Take 1 tablet by mouth daily.      Marland Kitchen omeprazole (PRILOSEC) 20 MG capsule TAKE 1 CAPSULE (20 MG TOTAL) BY MOUTH 2 (TWO) TIMES DAILY BEFORE A MEAL. 180 capsule 1  . [DISCONTINUED] DULoxetine (CYMBALTA) 20 MG capsule Take 1 capsule (20 mg total) by mouth daily. Can increase it to two tablets in one week 45 capsule 0   No current facility-administered medications on file prior to visit.    ROS Review of Systems  Constitutional: Negative for fever.  Respiratory: Negative for shortness of breath.   Cardiovascular: Negative for chest pain.  Musculoskeletal: Negative for arthralgias.  Skin: Negative for rash.    Objective:  BP (!) 145/98   Pulse 69   Temp 98.6 F (37 C) (Temporal)   Ht 5' 8.3" (1.735 m)   Wt 173 lb 9.6 oz (78.7 kg)   SpO2 97%   BMI 26.16 kg/m   BP Readings from Last 3 Encounters:  02/28/19 (!) 145/98  12/20/18 120/78  08/17/18 (!) 147/92    Wt Readings from Last 3 Encounters:  02/28/19 173 lb 9.6 oz (78.7 kg)  12/20/18 174 lb 4 oz (79 kg)  08/17/18 173 lb (78.5 kg)     Physical Exam  Constitutional:      General: [name] is not in acute distress.    Appearance: [name] is well-developed.  HENT:     Head: Normocephalic and atraumatic.     Right Ear: External ear normal.     Left Ear: External ear normal.     Nose: Nose normal.  Eyes:     Conjunctiva/sclera: Conjunctivae normal.     Pupils: Pupils are equal, round, and reactive to light.  Cardiovascular:     Rate and Rhythm: Normal rate and regular rhythm.     Heart sounds: Normal heart sounds. No murmur.  Pulmonary:      Effort: Pulmonary effort is normal. No respiratory distress.     Breath sounds: Normal breath sounds. No wheezing or rales.  Abdominal:     Palpations: Abdomen is soft.     Tenderness: There is no abdominal tenderness.  Musculoskeletal:        General: Normal range of motion.     Cervical back: Normal range of motion and neck supple.  Skin:    General: Skin is warm and dry.  Neurological:     Mental Status: [name] is alert and oriented to person, place, and time.     Deep Tendon Reflexes: Reflexes are normal and symmetric.  Psychiatric:        Behavior: Behavior normal.        Thought Content: Thought content normal.        Judgment: Judgment normal.     Lab Results  Component Value Date   HGBA1C 5.7 04/29/2015   HGBA1C 5.4 10/11/2013    Lab Results  Component Value Date   WBC 5.8 02/28/2019   HGB 14.9 02/28/2019   HCT 43.4 02/28/2019   PLT 241 02/28/2019   GLUCOSE 93 02/28/2019   CHOL 132 02/28/2019   TRIG 87 02/28/2019   HDL 45 02/28/2019   LDLCALC 70 02/28/2019   ALT 21 02/28/2019   AST 26 02/28/2019   NA 141 02/28/2019   K 4.0 02/28/2019   CL 100 02/28/2019   CREATININE 0.74 (L) 02/28/2019   BUN 14 02/28/2019   CO2 22 02/28/2019   TSH 1.920 02/28/2019   PSA 1.4 04/19/2014   HGBA1C 5.7 04/29/2015    CT Chest Wo Contrast  Result Date: 12/31/2017 CLINICAL DATA:  Right upper lung nodules follow-up.  Hoarseness. EXAM: CT CHEST WITHOUT CONTRAST TECHNIQUE: Multidetector CT imaging of the chest was performed following the standard protocol without IV contrast. COMPARISON:  10/16/2014, PET-CT 07/27/2014 and CT 07/12/2014 FINDINGS: Cardiovascular: Heart is normal size. There is calcified plaque over the left anterior descending, lateral circumflex and right coronary arteries. Mild aneurysmal dilatation of the ascending thoracic aorta measuring 4 cm in AP diameter unchanged. Remaining vascular structures are unremarkable. Mediastinum/Nodes: No mediastinal or hilar  adenopathy. Remaining mediastinal structures are normal. Lungs/Pleura: Lungs are adequately inflated without focal airspace consolidation or effusion. Mild linear density over the right apex likely scarring. Several small subcentimeter bilateral pulmonary nodules but most of which are unchanged. There is a new 2 mm nodule over the right middle lobe. There are a couple new small nodular densities over the left upper lobe. Largest nodule within the right lung is over the subpleural region of the anterior right middle lobe as well as lateral right lower lobe both measuring approximately 6 mm. Largest nodule within the left lung is over the left upper lobe measuring 5 mm. Airways are normal. Upper Abdomen: No acute findings. Musculoskeletal: Degenerative change of the spine.  IMPRESSION: No acute cardiopulmonary disease. Multiple bilateral subcentimeter pulmonary nodules most of which are unchanged from August 2016, although there are a few new small nodules as described. These are likely benign. Recommend follow-up noncontrast chest CT 1 year. This recommendation follows the consensus statement: Guidelines for Management of Small Pulmonary Nodules Detected on CT Scans: A Statement from the [employer] as published in Radiology 2005; 237:395-400. Online at: https://www.arnold.com/. Mild aneurysmal dilatation of the ascending thoracic aorta which is unchanged measuring 4 cm in AP diameter. Recommend annual imaging followup by CTA or MRA. This recommendation follows 2010 ACCF/AHA/AATS/ACR/ASA/SCA/SCAI/SIR/STS/SVM Guidelines for the Diagnosis and Management of Patients with Thoracic Aortic Disease. Circulation. 2010; [id number]. Atherosclerotic coronary artery disease. Electronically Signed   By: Marin Olp M.D.   On: 12/31/2017 17:02    Assessment & Plan:   [name] was seen today for medical management of chronic issues.  Diagnoses and all orders for this visit:  Acquired  hypothyroidism -     TSH + free T4  Mixed hyperlipidemia -     CBC -     CMP -     Lipid  Other orders -     simvastatin (ZOCOR) 20 MG tablet; TAKE 1 TABLET BY MOUTH EVERYDAY AT BEDTIME -     levothyroxine (SYNTHROID) 88 MCG tablet; Take 1 tablet (88 mcg total) by mouth daily.   I have discontinued Yacob E. Bound's albuterol and ALPRAZolam. I am also having him maintain his multivitamin, Vitamin D, aspirin, Budesonide (RHINOCORT AQUA NA), chlordiazePOXIDE, Flovent HFA, omeprazole, simvastatin, and levothyroxine.  Meds ordered this encounter  Medications  . simvastatin (ZOCOR) 20 MG tablet    Sig: TAKE 1 TABLET BY MOUTH EVERYDAY AT BEDTIME    Dispense:  90 tablet    Refill:  1  . levothyroxine (SYNTHROID) 88 MCG tablet    Sig: Take 1 tablet (88 mcg total) by mouth daily.    Dispense:  90 tablet    Refill:  3     Follow-up: Return in about 6 months (around 08/28/2019).  Claretta Fraise, M.D.

## 2019-03-02 LAB — CBC WITH DIFFERENTIAL/PLATELET
Basophils Absolute: 0.1 10*3/uL (ref 0.0–0.2)
Basos: 1 %
EOS (ABSOLUTE): 0.2 10*3/uL (ref 0.0–0.4)
Eos: 3 %
Hematocrit: 43.4 % (ref 37.5–51.0)
Hemoglobin: 14.9 g/dL (ref 13.0–17.7)
Immature Grans (Abs): 0 10*3/uL (ref 0.0–0.1)
Immature Granulocytes: 0 %
Lymphocytes Absolute: 2.4 10*3/uL (ref 0.7–3.1)
Lymphs: 41 %
MCH: 30.7 pg (ref 26.6–33.0)
MCHC: 34.3 g/dL (ref 31.5–35.7)
MCV: 90 fL (ref 79–97)
Monocytes Absolute: 0.5 10*3/uL (ref 0.1–0.9)
Monocytes: 9 %
Neutrophils Absolute: 2.7 10*3/uL (ref 1.4–7.0)
Neutrophils: 46 %
Platelets: 241 10*3/uL (ref 150–450)
RBC: 4.85 x10E6/uL (ref 4.14–5.80)
RDW: 12.1 % (ref 11.6–15.4)
WBC: 5.8 10*3/uL (ref 3.4–10.8)

## 2019-03-02 LAB — LIPID PANEL
Chol/HDL Ratio: 2.9 ratio (ref 0.0–5.0)
Cholesterol, Total: 132 mg/dL (ref 100–199)
HDL: 45 mg/dL (ref >39)
LDL Chol Calc (NIH): 70 mg/dL (ref 0–99)
Triglycerides: 87 mg/dL (ref 0–149)
VLDL Cholesterol Cal: 17 mg/dL (ref 5–40)

## 2019-03-02 LAB — CMP14+EGFR
ALT: 21 IU/L (ref 0–44)
AST: 26 IU/L (ref 0–40)
Albumin/Globulin Ratio: 2 (ref 1.2–2.2)
Albumin: 4.8 g/dL — ABNORMAL HIGH (ref 3.7–4.7)
Alkaline Phosphatase: 82 IU/L (ref 39–117)
BUN/Creatinine Ratio: 19 (ref 10–24)
BUN: 14 mg/dL (ref 8–27)
Bilirubin Total: 0.5 mg/dL (ref 0.0–1.2)
CO2: 22 mmol/L (ref 20–29)
Calcium: 9 mg/dL (ref 8.6–10.2)
Chloride: 100 mmol/L (ref 96–106)
Creatinine, Ser: 0.74 mg/dL — ABNORMAL LOW (ref 0.76–1.27)
GFR calc Af Amer: 103 mL/min/{1.73_m2} (ref >59)
GFR calc non Af Amer: 89 mL/min/{1.73_m2} (ref >59)
Globulin, Total: 2.4 g/dL (ref 1.5–4.5)
Glucose: 93 mg/dL (ref 65–99)
Potassium: 4 mmol/L (ref 3.5–5.2)
Sodium: 141 mmol/L (ref 134–144)
Total Protein: 7.2 g/dL (ref 6.0–8.5)

## 2019-03-02 LAB — TSH+FREE T4
Free T4: 1.31 ng/dL (ref 0.82–1.77)
TSH: 1.92 u[IU]/mL (ref 0.450–4.500)

## 2019-03-02 NOTE — Progress Notes (Signed)
Hello Johathon,  Your lab result is normal and/or stable.Some minor variations that are not significant are commonly marked abnormal, but do not represent any medical problem for you.  Best regards, Kristel Durkee, M.D.

## 2019-03-06 ENCOUNTER — Other Ambulatory Visit: Payer: Self-pay | Admitting: Family Medicine

## 2019-03-07 ENCOUNTER — Encounter: Payer: Self-pay | Admitting: Family Medicine

## 2019-03-10 IMAGING — DX DG CHEST 2V
2 series · 2 of 2 positions shown · non-contrast
Comparison: CT scan 10/16/2014

CLINICAL DATA: Coughing congestion with flu like symptoms.  Fever.

EXAM:
CHEST  2 VIEW

[chest pa]
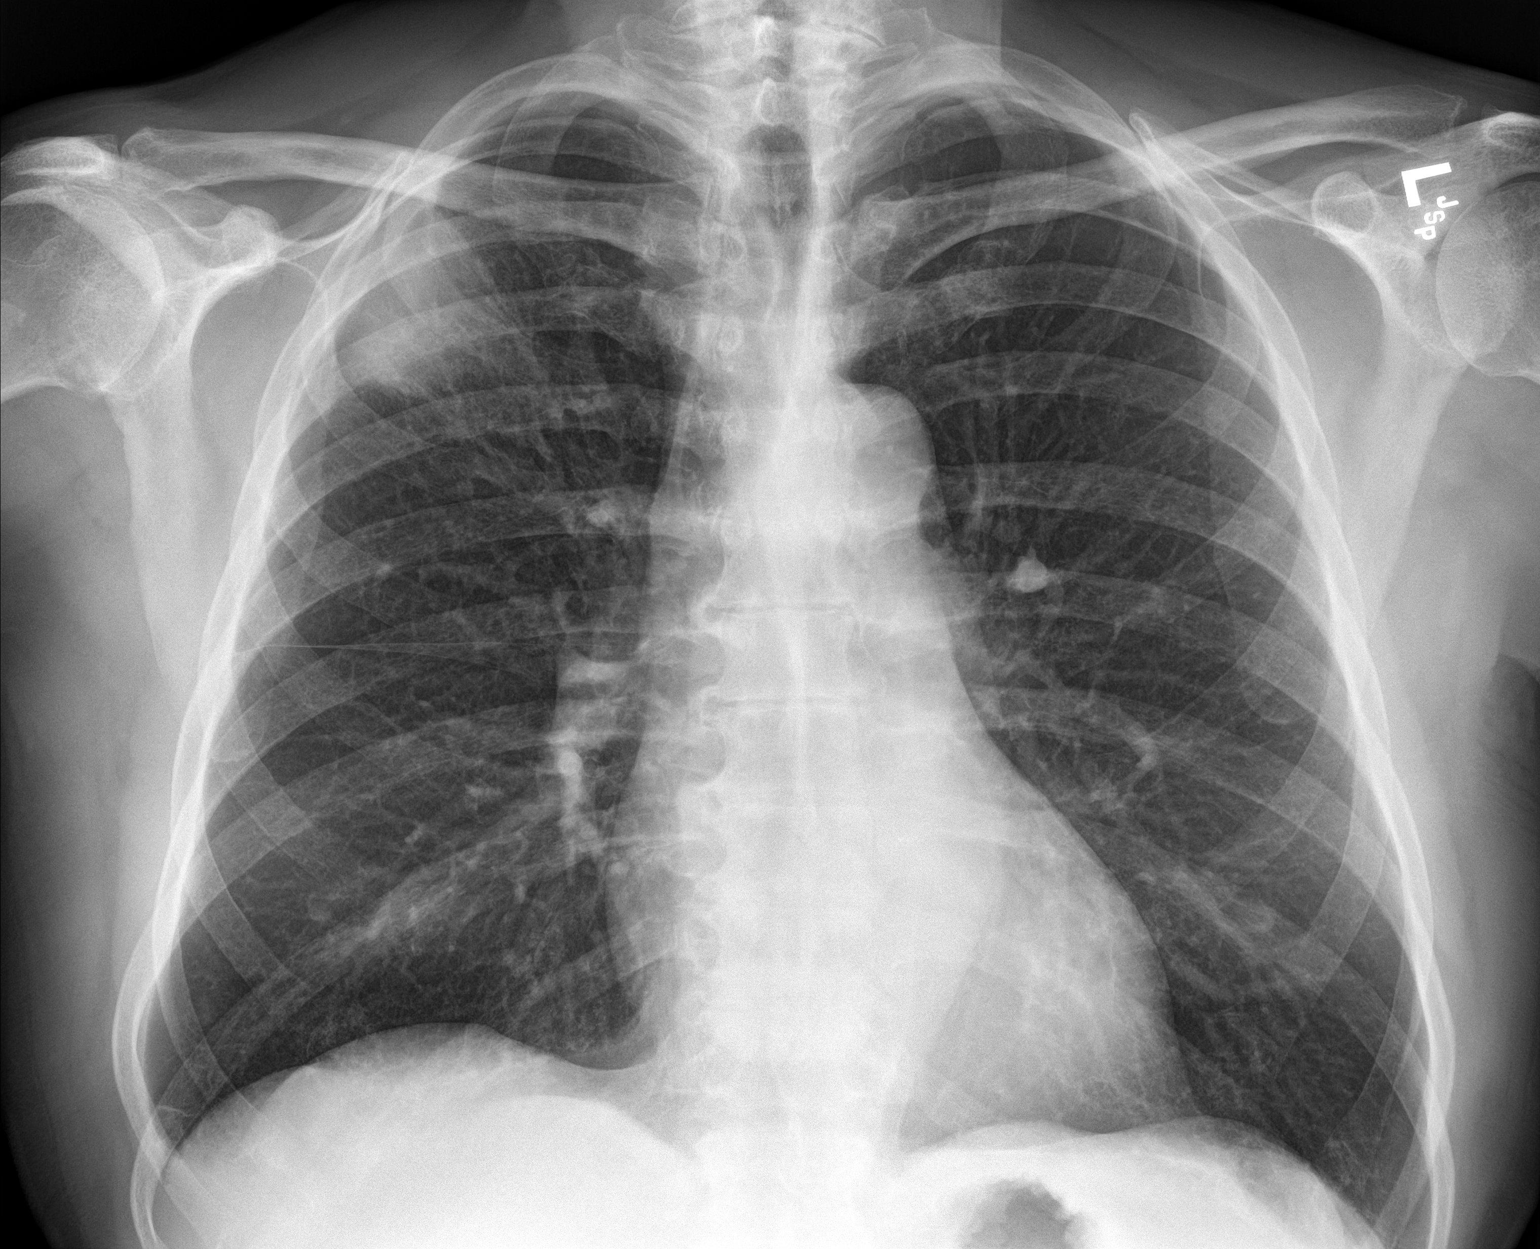

[chest lat]
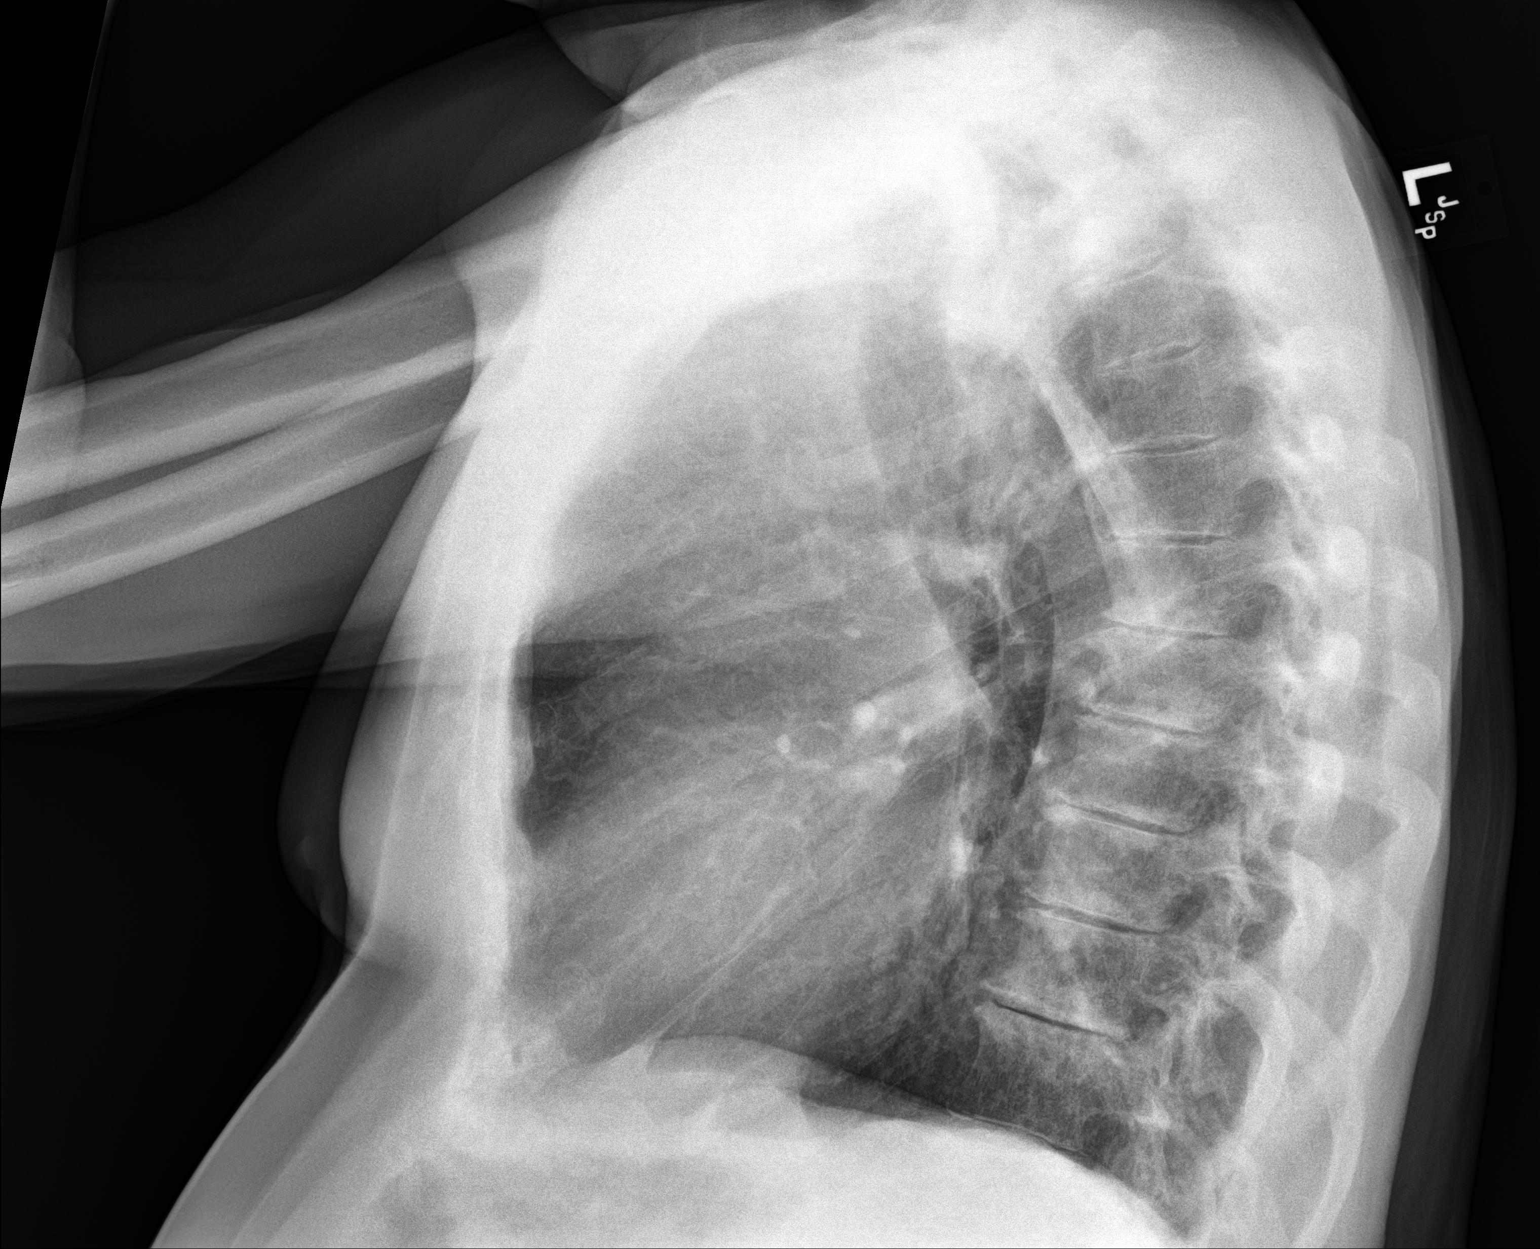

[2 of 2 positions shown; findings below may reference images not displayed]

FINDINGS: Focal airspace opacity identified right upper lobe. Left lung clear.
No evidence for pulmonary edema or pleural effusion. hyperexpansion
suggests underlying emphysema. The cardiopericardial silhouette is
within normal limits for size. The visualized bony structures of the
thorax are intact.
IMPRESSION: Focal opacity right upper lobe likely pneumonia given the reported
symptoms. As neoplasm could have this appearance, follow-up imaging
is recommended to ensure resolution.

These results will be called to the ordering clinician or
representative by the Radiologist Assistant, and communication
documented in the PACS or zVision Dashboard.

## 2019-04-11 ENCOUNTER — Telehealth: Payer: Self-pay

## 2019-04-11 NOTE — Telephone Encounter (Signed)
Patient called and stated that he had an office visit April 2020 and Dr. Verlin Fester talked about taking patient off of his medications. He stated that in October 2020 Dr. Verlin Fester decreased his Flovent 110 to 1 inhalation daily with a spacer. Patient stated that him and Dr. Verlin Fester talked about the benefit of staying on an inhaler during this pandemic. Patient stated that he feels he is doing well and can come off of the Flovent all together but wanted to get Dr. Mariane Masters recommendation first. Please advise.

## 2019-04-11 NOTE — Telephone Encounter (Signed)
Called and left a detailed message per patient instructions informing him of Dr. Mariane Masters recommendation.

## 2019-04-11 NOTE — Telephone Encounter (Signed)
If his asthma has been well controlled, he can hold the Flovent.  If lower respiratory symptoms progress in frequency and/or severity, the patient is to resume the previous dose.  I would like for him to have Flovent on hand for burst therapy as follows: During respiratory tract infections or asthma flares, add Flovent 110g 2 inhalations via spacer device 2 times per day until symptoms have returned to baseline. Thanks.

## 2019-05-25 ENCOUNTER — Telehealth: Payer: Self-pay | Admitting: Family Medicine

## 2019-05-25 NOTE — Chronic Care Management (AMB) (Signed)
  Chronic Care Management   Outreach Note  05/25/2019 Name: [name] MRN: [id number] DOB: [birth year]  [name] is a 79 y.o. year old male who is a primary care patient of Stacks, Cletus Gash, MD. I reached out to Janice Norrie by phone today in response to a referral sent by Mr. VESTER KROUT health plan.     An unsuccessful telephone outreach was attempted today. The patient was referred to the case management team for assistance with care management and care coordination.   Follow Up Plan: A HIPPA compliant phone message was left for the patient providing contact information and requesting a return call. The care management team will reach out to the patient again over the next 7 days. If patient returns call to provider office, please advise to call [facility] at [phone].  [place], [place] [address] Direct Dial: [phone] [email or web address] [email or web address]: [email or web address]  Chronic Care Management   Outreach Note  05/25/2019 Name: [name] MRN: [id number] DOB: 04/05/1940  [name] is a 79 y.o. year old male who is a primary care patient of Stacks, Cletus Gash, MD. I reached out to Janice Norrie by phone today in response to a referral sent by Mr. AMYR WASZAK health plan.     An unsuccessful telephone outreach was attempted today. The patient was referred to the case management team for assistance with care management and care coordination.   Follow Up Plan: A HIPPA compliant phone message was left for the patient providing contact information and requesting a return call. The care management team will reach out to the patient again over the next 7 days. If patient returns call to provider office, please advise to call [facility] at [phone].  [place], [place] [address] Direct Dial: [phone] [email or web address]: [email or web address]

## 2019-06-05 NOTE — Chronic Care Management (AMB) (Signed)
  Chronic Care Management   Outreach Note  06/05/2019 Name: [name] MRN: [id number] DOB: December 31, 1940  [name] is a 79 y.o. year old male who is a primary care patient of Stacks, Cletus Gash, MD. I reached out to [name] by phone today in response to a referral sent by Mr. [name] health plan.     A second unsuccessful telephone outreach was attempted today. The patient was referred to the case management team for assistance with care management and care coordination.   Follow Up Plan: A HIPPA compliant phone message was left for the patient providing contact information and requesting a return call.  The care management team will reach out to the patient again over the next 7 days. If patient returns call to provider office, please advise to call [place] [facility] at [phone].  [place], [place] [address] Direct Dial: [phone] [email or web address] Website: [email or web address]

## 2019-06-06 NOTE — Chronic Care Management (AMB) (Signed)
  Chronic Care Management   Outreach Note  06/06/2019 Name: [name] MRN: [id number] DOB: 08-09-1940  [name] is a 79 y.o. year old male who is a primary care patient of Stacks, Cletus Gash, MD. I reached out to [name] by phone today in response to a referral sent by [name] health plan.     Third unsuccessful telephone outreach was attempted today. The patient was referred to the case management team for assistance with care management and care coordination. The patient's primary care provider has been notified of our unsuccessful attempts to make or maintain contact with the patient. The care management team is pleased to engage with this patient at any time in the future should he/she be interested in assistance from the care management team.   Follow Up Plan: A HIPPA compliant phone message was left for the patient providing contact information and requesting a return call.  If patient returns call to provider office, please advise to call [facility] at [phone].  [place], [place] [address] Direct Dial: [phone] [email or web address] Website: [email or web address]

## 2019-06-20 ENCOUNTER — Other Ambulatory Visit: Payer: Self-pay

## 2019-06-20 ENCOUNTER — Ambulatory Visit: Payer: Medicare Other | Admitting: Allergy and Immunology

## 2019-06-20 ENCOUNTER — Encounter: Payer: Self-pay | Admitting: Allergy and Immunology

## 2019-06-20 DIAGNOSIS — J3089 Other allergic rhinitis: Secondary | ICD-10-CM | POA: Diagnosis not present

## 2019-06-20 DIAGNOSIS — R040 Epistaxis: Secondary | ICD-10-CM | POA: Diagnosis not present

## 2019-06-20 DIAGNOSIS — J4520 Mild intermittent asthma, uncomplicated: Secondary | ICD-10-CM

## 2019-06-20 DIAGNOSIS — K219 Gastro-esophageal reflux disease without esophagitis: Secondary | ICD-10-CM

## 2019-06-20 NOTE — Progress Notes (Signed)
Follow-up Note  RE: [name] MRN: [id number] DOB: December 10, 1940 Date of Office Visit: 06/20/2019  Primary care provider: Claretta Fraise, MD Referring provider: Claretta Fraise, MD  History of present illness: [name] is a 79 y.o. male with asthma, allergic rhinitis, and gastroesophageal reflux presenting today for follow-up.  He reports that he discontinued Flovent approximately 6 months ago and get his asthma has been well controlled.  He has not required albuterol rescue and denies limitations in normal daily activities and nocturnal awakenings due to lower respiratory symptoms.  He reports that his nasal symptoms are controlled with Afrin once daily and budesonide nasal spray 3 times per week, as well as NeilMed nasal rinse.  He reports that there is a shallow vessel in his left nostril which will occasionally rupture and lead to significant epistaxis.  He has noticed that if he uses a spray of Afrin in the nostril it will help to stop the blood flow.  He has no reflux related complaints today.  Assessment and plan: Mild intermittent asthma Well-controlled.  Albuterol HFA, 1 to 2 inhalations every 4-6 hours if needed.  During respiratory tract infections or asthma flares, add Flovent 110g 2 inhalations via spacer device 2 times per day until symptoms have returned to baseline.  Allergic rhinitis Stable.  Continue appropriate aeroallergen avoidance measures.  For now, continue nasal saline irrigation as needed, Afrin at bedtime and budesonide nasal spray 3 times per week.  GERD (gastroesophageal reflux disease) Stable.  Continue appropriate reflux lifestyle modifications and omeprazole as prescribed.  This issue is followed by his primary care physician.  Epistaxis  Nasal saline gel is recommended to moisturize nasal mucosa.  The use of a cool-mist humidifier during the night is recommended.  During epistaxis, if needed, oxymetazoline (Afrin) nasal spray may be  applied to a cotton ball to help stanch the blood flow.  If this problem persists or progresses, otolaryngology (ENT) evaluation may be warranted.   Diagnostics: Spirometry:  Normal with an FEV1 of 122% predicted. This study was performed while the patient was asymptomatic.  Please see scanned spirometry results for details.    Physical examination: Blood pressure 120/72, pulse 81, temperature 98.2 F (36.8 C), temperature source Temporal, resp. rate 16, SpO2 95 %.  General: Alert, interactive, in no acute distress. HEENT: TMs pearly gray, turbinates mildly edematous without discharge, post-pharynx moderately erythematous. Neck: Supple without lymphadenopathy. Lungs: Clear to auscultation without wheezing, rhonchi or rales. CV: Normal S1, S2 without murmurs. Skin: Warm and dry, without lesions or rashes.  The following portions of the patient's history were reviewed and updated as appropriate: allergies, current medications, past family history, past medical history, past social history, past surgical history and problem list.  Current Outpatient Medications  Medication Sig Dispense Refill  . aspirin 81 MG tablet Take 81 mg by mouth daily.    . Budesonide (RHINOCORT AQUA NA) Place 1 puff into the nose. Three times a week    . chlordiazePOXIDE (LIBRIUM) 5 MG capsule Take 2 capsules (10 mg total) by mouth at bedtime. 30 capsule 5  . Cholecalciferol (VITAMIN D) 2000 UNITS CAPS Take 1 capsule by mouth daily.      Marland Kitchen levothyroxine (SYNTHROID) 88 MCG tablet Take 1 tablet (88 mcg total) by mouth daily. 90 tablet 3  . mirtazapine (REMERON) 45 MG tablet TAKE 1 TABLET (45 MG TOTAL) BY MOUTH AT BEDTIME. 90 tablet 1  . Multiple Vitamin (MULTIVITAMIN) tablet Take 1 tablet by mouth daily.      Marland Kitchen  omeprazole (PRILOSEC) 20 MG capsule TAKE 1 CAPSULE (20 MG TOTAL) BY MOUTH 2 (TWO) TIMES DAILY BEFORE A MEAL. 180 capsule 1  . simvastatin (ZOCOR) 20 MG tablet TAKE 1 TABLET BY MOUTH EVERYDAY AT BEDTIME 90  tablet 1  . FLOVENT HFA 110 MCG/ACT inhaler Inhale 2 puffs into the lungs 2 (two) times daily.     No current facility-administered medications for this visit.    Allergies  Allergen Reactions  . Celexa [Citalopram Hydrobromide]     Panic attacks  . Cymbalta [Duloxetine Hcl]     Panic / anxiety attacks  . Procardia [Nifedipine] Other (See Comments)    Weakness   . Wellbutrin [Bupropion] Anxiety    Panic attacks   Review of systems: Review of systems negative except as noted in HPI / PMHx.  Past Medical History:  Diagnosis Date  . Allergic rhinitis, cause unspecified   . Allergy    a lot seasonal allergies  . Anxiety   . Arthritis    degenerative arthritis of the neck  . Depression   . GERD (gastroesophageal reflux disease)   . Hyperlipidemia   . Hypothyroidism   . Irritable bowel syndrome   . Kidney stones    per pt, not sure if had kidney stones.  . Obesity   . Panic attacks 03/2015  . Shoulder pain, right   . Unspecified asthma(493.90)     Family History  Problem Relation Age of Onset  . Breast cancer Mother   . Kidney disease Mother   . Uterine cancer Mother   . Diabetes Father   . Heart disease Father     Social History   Socioeconomic History  . Marital status: Widowed    Spouse name: Not on file  . Number of children: Not on file  . Years of education: Not on file  . Highest education level: Not on file  Occupational History  . Occupation: retired  Tobacco Use  . Smoking status: Former Smoker    Packs/day: 2.00    Years: 2.00    Pack years: 4.00    Types: Cigarettes    Quit date: 02/23/1969    Years since quitting: 50.3  . Smokeless tobacco: Never Used  Substance and Sexual Activity  . Alcohol use: No    Alcohol/week: 0.0 standard drinks  . Drug use: No  . Sexual activity: Not on file  Other Topics Concern  . Not on file  Social History Narrative  . Not on file   Social Determinants of Health   Financial Resource Strain:   .  Difficulty of Paying Living Expenses:   Food Insecurity:   . Worried About Charity fundraiser in the Last Year:   . Arboriculturist in the Last Year:   Transportation Needs:   . Film/video editor (Medical):   Marland Kitchen Lack of Transportation (Non-Medical):   Physical Activity:   . Days of Exercise per Week:   . Minutes of Exercise per Session:   Stress:   . Feeling of Stress :   Social Connections:   . Frequency of Communication with Friends and Family:   . Frequency of Social Gatherings with Friends and Family:   . Attends Religious Services:   . Active Member of Clubs or Organizations:   . Attends Archivist Meetings:   Marland Kitchen Marital Status:   Intimate Partner Violence:   . Fear of Current or Ex-Partner:   . Emotionally Abused:   Marland Kitchen Physically Abused:   .  Sexually Abused:     I appreciate the opportunity to take part in Valeriano's care. Please do not hesitate to contact me with questions.  Sincerely,   R. Edgar Frisk, MD

## 2019-06-20 NOTE — Patient Instructions (Signed)
Mild intermittent asthma Well-controlled.  Albuterol HFA, 1 to 2 inhalations every 4-6 hours if needed.  During respiratory tract infections or asthma flares, add Flovent 110g 2 inhalations via spacer device 2 times per day until symptoms have returned to baseline.  Allergic rhinitis Stable.  Continue appropriate aeroallergen avoidance measures.  For now, continue nasal saline irrigation as needed, Afrin at bedtime and budesonide nasal spray 3 times per week.  GERD (gastroesophageal reflux disease) Stable.  Continue appropriate reflux lifestyle modifications and omeprazole as prescribed.  This issue is followed by his primary care physician.  Epistaxis  Nasal saline gel is recommended to moisturize nasal mucosa.  The use of a cool-mist humidifier during the night is recommended.  During epistaxis, if needed, oxymetazoline (Afrin) nasal spray may be applied to a cotton ball to help stanch the blood flow.  If this problem persists or progresses, otolaryngology (ENT) evaluation may be warranted.    Return if symptoms worsen or fail to improve.

## 2019-06-20 NOTE — Assessment & Plan Note (Addendum)
Stable.  Continue appropriate reflux lifestyle modifications and omeprazole as prescribed.  This issue is followed by his primary care physician.

## 2019-06-20 NOTE — Addendum Note (Signed)
Addended by: Herbie Drape on: 06/20/2019 05:10 PM   Modules accepted: Orders

## 2019-06-20 NOTE — Assessment & Plan Note (Signed)
Stable.  Continue appropriate aeroallergen avoidance measures.  For now, continue nasal saline irrigation as needed, Afrin at bedtime and budesonide nasal spray 3 times per week.

## 2019-06-20 NOTE — Assessment & Plan Note (Signed)
   Nasal saline gel is recommended to moisturize nasal mucosa.  The use of a cool-mist humidifier during the night is recommended.  During epistaxis, if needed, oxymetazoline (Afrin) nasal spray may be applied to a cotton ball to help stanch the blood flow.  If this problem persists or progresses, otolaryngology (ENT) evaluation may be warranted.

## 2019-06-20 NOTE — Assessment & Plan Note (Addendum)
Well-controlled.  Albuterol HFA, 1 to 2 inhalations every 4-6 hours if needed.  During respiratory tract infections or asthma flares, add Flovent 110g 2 inhalations via spacer device 2 times per day until symptoms have returned to baseline.

## 2019-08-28 ENCOUNTER — Other Ambulatory Visit: Payer: Self-pay | Admitting: Family Medicine

## 2019-08-29 ENCOUNTER — Other Ambulatory Visit: Payer: Self-pay

## 2019-08-29 ENCOUNTER — Encounter: Payer: Self-pay | Admitting: Family Medicine

## 2019-08-29 ENCOUNTER — Ambulatory Visit (INDEPENDENT_AMBULATORY_CARE_PROVIDER_SITE_OTHER): Payer: Medicare Other | Admitting: Family Medicine

## 2019-08-29 VITALS — BP 127/84 | HR 73 | Temp 98.6°F | Resp 20 | Ht 68.3 in | Wt 170.4 lb

## 2019-08-29 DIAGNOSIS — K219 Gastro-esophageal reflux disease without esophagitis: Secondary | ICD-10-CM | POA: Diagnosis not present

## 2019-08-29 DIAGNOSIS — H6121 Impacted cerumen, right ear: Secondary | ICD-10-CM

## 2019-08-29 DIAGNOSIS — E782 Mixed hyperlipidemia: Secondary | ICD-10-CM

## 2019-08-29 DIAGNOSIS — E039 Hypothyroidism, unspecified: Secondary | ICD-10-CM

## 2019-08-29 DIAGNOSIS — Z1159 Encounter for screening for other viral diseases: Secondary | ICD-10-CM | POA: Diagnosis not present

## 2019-08-29 DIAGNOSIS — F321 Major depressive disorder, single episode, moderate: Secondary | ICD-10-CM | POA: Diagnosis not present

## 2019-08-29 LAB — URINALYSIS
Bilirubin, UA: NEGATIVE
Glucose, UA: NEGATIVE
Ketones, UA: NEGATIVE
Leukocytes,UA: NEGATIVE
Nitrite, UA: NEGATIVE
Protein,UA: NEGATIVE
RBC, UA: NEGATIVE
Specific Gravity, UA: 1.025 (ref 1.005–1.030)
Urobilinogen, Ur: 0.2 mg/dL (ref 0.2–1.0)
pH, UA: 5.5 (ref 5.0–7.5)

## 2019-08-29 MED ORDER — LEVOTHYROXINE SODIUM 88 MCG PO TABS: 88.0000 ug | ORAL_TABLET | Freq: Every day | ORAL | 3 refills | Status: DC

## 2019-08-29 MED ORDER — SIMVASTATIN 20 MG PO TABS: ORAL_TABLET | ORAL | 1 refills | Status: DC

## 2019-08-29 MED ORDER — OMEPRAZOLE 20 MG PO CPDR: 20.0000 mg | DELAYED_RELEASE_CAPSULE | Freq: Two times a day (BID) | ORAL | 1 refills | Status: DC

## 2019-08-29 NOTE — Progress Notes (Signed)
Subjective:  Patient ID: [name], male    DOB: 04/07/1940  Age: 79 y.o. MRN: [id number]  CC: Medical Management of Chronic Issues   HPI [name] presents for follow-up of elevated cholesterol. Doing well without complaints on current medication. Denies side effects of statin including myalgia and arthralgia and nausea. Also in today for liver function testing. Currently no chest pain, shortness of breath or other cardiovascular related symptoms noted.  Patient in for follow-up of GERD. Currently asymptomatic taking  PPI daily. There is no chest pain or heartburn. No hematemesis and no melena. No dysphagia or choking. Onset is remote. Progression is stable. Complicating factors, none.   follow-up on  thyroid. The patient has a history of hypothyroidism for many years. It has been stable recently. Pt. denies any change in  voice, loss of hair, heat or cold intolerance. Energy level has been adequate to good. Patient denies constipation and diarrhea. No myxedema. Medication is as noted below. Verified that pt is taking it daily on an empty stomach. Well tolerated.  Patient reports a history of depression for which psychiatry is treating him.  He obtains Librium through his psychiatrist along with mirtazapine.  History [name] has a past medical history of Allergic rhinitis, cause unspecified, Allergy, Anxiety, Arthritis, Depression, GERD (gastroesophageal reflux disease), Hyperlipidemia, Hypothyroidism, Irritable bowel syndrome, Kidney stones, Obesity, Panic attacks (03/2015), Shoulder pain, right, and Unspecified asthma(493.90).   He has a past surgical history that includes Tonsillectomy; Vasectomy; Rotator cuff repair (2005); Cataract extraction bilateral w/ anterior vitrectomy (2005); and Transurethral resection of prostate (2005).   His family history includes Breast cancer in his mother; Diabetes in his father; Heart disease in his father; Kidney disease in his mother; Uterine  cancer in his mother.He reports that he quit smoking about 50 years ago. His smoking use included cigarettes. He has a 4.00 pack-year smoking history. He has never used smokeless tobacco. He reports that he does not drink alcohol and does not use drugs.  Current Outpatient Medications on File Prior to Visit  Medication Sig Dispense Refill  . aspirin 81 MG tablet Take 81 mg by mouth daily.    . Budesonide (RHINOCORT AQUA NA) Place 1 puff into the nose. Three times a week    . chlordiazePOXIDE (LIBRIUM) 5 MG capsule Take 2 capsules (10 mg total) by mouth at bedtime. 30 capsule 5  . Cholecalciferol (VITAMIN D) 2000 UNITS CAPS Take 1 capsule by mouth daily.      . Multiple Vitamin (MULTIVITAMIN) tablet Take 1 tablet by mouth daily.      . mirtazapine (REMERON) 45 MG tablet TAKE 1 TABLET (45 MG TOTAL) BY MOUTH AT BEDTIME. 90 tablet 1  . [DISCONTINUED] DULoxetine (CYMBALTA) 20 MG capsule Take 1 capsule (20 mg total) by mouth daily. Can increase it to two tablets in one week 45 capsule 0   No current facility-administered medications on file prior to visit.    ROS Review of Systems  Constitutional: Negative for fever.  HENT: Positive for ear pain (A feeling of fullness.  Would like to see if the ears need to be cleaned.).   Respiratory: Negative for shortness of breath.   Cardiovascular: Negative for chest pain.  Musculoskeletal: Negative for arthralgias.  Skin: Negative for rash.    Objective:  BP 127/84   Pulse 73   Temp 98.6 F (37 C) (Temporal)   Resp 20   Ht 5' 8.3" (1.735 m)   Wt 170 lb 6 oz (77.3  kg)   SpO2 96%   BMI 25.68 kg/m   BP Readings from Last 3 Encounters:  08/29/19 127/84  06/20/19 120/72  02/28/19 (!) 145/98    Wt Readings from Last 3 Encounters:  08/29/19 170 lb 6 oz (77.3 kg)  02/28/19 173 lb 9.6 oz (78.7 kg)  12/20/18 174 lb 4 oz (79 kg)     Physical Exam Vitals reviewed.  Constitutional:      Appearance: He is well-developed.  HENT:     Head:  Normocephalic and atraumatic.     Right Ear: External ear normal. There is impacted cerumen.     Left Ear: External ear normal. There is no impacted cerumen.     Nose: Nose normal.     Mouth/Throat:     Pharynx: No oropharyngeal exudate or posterior oropharyngeal erythema.  Eyes:     Pupils: Pupils are equal, round, and reactive to light.  Cardiovascular:     Rate and Rhythm: Normal rate and regular rhythm.     Heart sounds: No murmur heard.   Pulmonary:     Effort: No respiratory distress.     Breath sounds: Normal breath sounds.  Musculoskeletal:     Cervical back: Normal range of motion and neck supple.  Neurological:     Mental Status: He is alert and oriented to person, place, and time.     Lab Results  Component Value Date   HGBA1C 5.7 04/29/2015   HGBA1C 5.4 10/11/2013    Lab Results  Component Value Date   WBC 5.8 02/28/2019   HGB 14.9 02/28/2019   HCT 43.4 02/28/2019   PLT 241 02/28/2019   GLUCOSE 93 02/28/2019   CHOL 132 02/28/2019   TRIG 87 02/28/2019   HDL 45 02/28/2019   LDLCALC 70 02/28/2019   ALT 21 02/28/2019   AST 26 02/28/2019   NA 141 02/28/2019   K 4.0 02/28/2019   CL 100 02/28/2019   CREATININE 0.74 (L) 02/28/2019   BUN 14 02/28/2019   CO2 22 02/28/2019   TSH 1.920 02/28/2019   PSA 1.4 04/19/2014   HGBA1C 5.7 04/29/2015    CT Chest Wo Contrast  Result Date: 12/31/2017 CLINICAL DATA:  Right upper lung nodules follow-up.  Hoarseness. EXAM: CT CHEST WITHOUT CONTRAST TECHNIQUE: Multidetector CT imaging of the chest was performed following the standard protocol without IV contrast. COMPARISON:  10/16/2014, PET-CT 07/27/2014 and CT 07/12/2014 FINDINGS: Cardiovascular: Heart is normal size. There is calcified plaque over the left anterior descending, lateral circumflex and right coronary arteries. Mild aneurysmal dilatation of the ascending thoracic aorta measuring 4 cm in AP diameter unchanged. Remaining vascular structures are unremarkable.  Mediastinum/Nodes: No mediastinal or hilar adenopathy. Remaining mediastinal structures are normal. Lungs/Pleura: Lungs are adequately inflated without focal airspace consolidation or effusion. Mild linear density over the right apex likely scarring. Several small subcentimeter bilateral pulmonary nodules but most of which are unchanged. There is a new 2 mm nodule over the right middle lobe. There are a couple new small nodular densities over the left upper lobe. Largest nodule within the right lung is over the subpleural region of the anterior right middle lobe as well as lateral right lower lobe both measuring approximately 6 mm. Largest nodule within the left lung is over the left upper lobe measuring 5 mm. Airways are normal. Upper Abdomen: No acute findings. Musculoskeletal: Degenerative change of the spine. IMPRESSION: No acute cardiopulmonary disease. Multiple bilateral subcentimeter pulmonary nodules most of which are unchanged from August 2016, although  there are a few new small nodules as described. These are likely benign. Recommend follow-up noncontrast chest CT 1 year. This recommendation follows the consensus statement: Guidelines for Management of Small Pulmonary Nodules Detected on CT Scans: A Statement from the [place] as published in Radiology 2005; 237:395-400. Online at: https://www.arnold.com/. Mild aneurysmal dilatation of the ascending thoracic aorta which is unchanged measuring 4 cm in AP diameter. Recommend annual imaging followup by CTA or MRA. This recommendation follows 2010 ACCF/AHA/AATS/ACR/ASA/SCA/SCAI/SIR/STS/SVM Guidelines for the Diagnosis and Management of Patients with Thoracic Aortic Disease. Circulation. 2010; 121: Z660-Y301. Atherosclerotic coronary artery disease. Electronically Signed   By: Marin Olp M.D.   On: 12/31/2017 17:02    Assessment & Plan:   Abishai was seen today for medical management of chronic issues.  Diagnoses  and all orders for this visit:  Hypothyroidism, unspecified type -     CBC with Differential/Platelet -     CMP14+EGFR -     TSH + free T4 -     Urinalysis  Gastroesophageal reflux disease, unspecified whether esophagitis present -     CBC with Differential/Platelet -     CMP14+EGFR -     Urinalysis  Mixed hyperlipidemia -     CBC with Differential/Platelet -     CMP14+EGFR  Depression, major, single episode, moderate (HCC) -     CBC with Differential/Platelet -     CMP14+EGFR  Need for hepatitis C screening test -     Hepatitis C antibody  Impacted cerumen of right ear  Other orders -     levothyroxine (SYNTHROID) 88 MCG tablet; Take 1 tablet (88 mcg total) by mouth daily. -     omeprazole (PRILOSEC) 20 MG capsule; Take 1 capsule (20 mg total) by mouth 2 (two) times daily before a meal. -     simvastatin (ZOCOR) 20 MG tablet; TAKE 1 TABLET BY MOUTH EVERYDAY AT BEDTIME   I have discontinued Shah E. Degraff's Flovent HFA. I am also having him maintain his multivitamin, Vitamin D, aspirin, Budesonide (RHINOCORT AQUA NA), chlordiazePOXIDE, levothyroxine, omeprazole, and simvastatin.  Meds ordered this encounter  Medications  . levothyroxine (SYNTHROID) 88 MCG tablet    Sig: Take 1 tablet (88 mcg total) by mouth daily.    Dispense:  90 tablet    Refill:  3  . omeprazole (PRILOSEC) 20 MG capsule    Sig: Take 1 capsule (20 mg total) by mouth 2 (two) times daily before a meal.    Dispense:  180 capsule    Refill:  1  . simvastatin (ZOCOR) 20 MG tablet    Sig: TAKE 1 TABLET BY MOUTH EVERYDAY AT BEDTIME    Dispense:  90 tablet    Refill:  1     Follow-up: Return in about 6 months (around 02/29/2020).  Claretta Fraise, M.D.

## 2019-08-30 LAB — CMP14+EGFR
ALT: 19 IU/L (ref 0–44)
AST: 23 IU/L (ref 0–40)
Albumin/Globulin Ratio: 2.1 (ref 1.2–2.2)
Albumin: 5 g/dL — ABNORMAL HIGH (ref 3.7–4.7)
Alkaline Phosphatase: 84 IU/L (ref 48–121)
BUN/Creatinine Ratio: 23 (ref 10–24)
BUN: 18 mg/dL (ref 8–27)
Bilirubin Total: 0.6 mg/dL (ref 0.0–1.2)
CO2: 24 mmol/L (ref 20–29)
Calcium: 9.3 mg/dL (ref 8.6–10.2)
Chloride: 102 mmol/L (ref 96–106)
Creatinine, Ser: 0.77 mg/dL (ref 0.76–1.27)
GFR calc Af Amer: 101 mL/min/{1.73_m2} (ref >59)
GFR calc non Af Amer: 88 mL/min/{1.73_m2} (ref >59)
Globulin, Total: 2.4 g/dL (ref 1.5–4.5)
Glucose: 101 mg/dL — ABNORMAL HIGH (ref 65–99)
Potassium: 4.2 mmol/L (ref 3.5–5.2)
Sodium: 141 mmol/L (ref 134–144)
Total Protein: 7.4 g/dL (ref 6.0–8.5)

## 2019-08-30 LAB — CBC WITH DIFFERENTIAL/PLATELET
Basophils Absolute: 0 10*3/uL (ref 0.0–0.2)
Basos: 0 %
EOS (ABSOLUTE): 0.3 10*3/uL (ref 0.0–0.4)
Eos: 5 %
Hematocrit: 42.7 % (ref 37.5–51.0)
Hemoglobin: 14.4 g/dL (ref 13.0–17.7)
Immature Grans (Abs): 0 10*3/uL (ref 0.0–0.1)
Immature Granulocytes: 0 %
Lymphocytes Absolute: 2.1 10*3/uL (ref 0.7–3.1)
Lymphs: 37 %
MCH: 30.3 pg (ref 26.6–33.0)
MCHC: 33.7 g/dL (ref 31.5–35.7)
MCV: 90 fL (ref 79–97)
Monocytes Absolute: 0.5 10*3/uL (ref 0.1–0.9)
Monocytes: 9 %
Neutrophils Absolute: 2.7 10*3/uL (ref 1.4–7.0)
Neutrophils: 49 %
Platelets: 233 10*3/uL (ref 150–450)
RBC: 4.76 x10E6/uL (ref 4.14–5.80)
RDW: 12.2 % (ref 11.6–15.4)
WBC: 5.6 10*3/uL (ref 3.4–10.8)

## 2019-08-30 LAB — TSH+FREE T4
Free T4: 1.25 ng/dL (ref 0.82–1.77)
TSH: 2.09 u[IU]/mL (ref 0.450–4.500)

## 2019-08-30 LAB — HEPATITIS C ANTIBODY: Hep C Virus Ab: <0.1 s/co ratio (ref 0.0–0.9)

## 2019-08-30 NOTE — Progress Notes (Signed)
Hello [name],  Your lab result is normal and/or stable.Some minor variations that are not significant are commonly marked abnormal, but do not represent any medical problem for you.  Best regards, Claretta Fraise, M.D.

## 2020-01-16 DIAGNOSIS — H26492 Other secondary cataract, left eye: Secondary | ICD-10-CM | POA: Diagnosis not present

## 2020-02-02 ENCOUNTER — Other Ambulatory Visit: Payer: Self-pay | Admitting: Family Medicine

## 2020-02-02 ENCOUNTER — Encounter: Payer: Self-pay | Admitting: *Deleted

## 2020-02-29 ENCOUNTER — Ambulatory Visit: Payer: Medicare Other | Admitting: Family Medicine

## 2020-03-05 ENCOUNTER — Ambulatory Visit (INDEPENDENT_AMBULATORY_CARE_PROVIDER_SITE_OTHER): Payer: Medicare Other | Admitting: Family Medicine

## 2020-03-05 ENCOUNTER — Encounter: Payer: Self-pay | Admitting: Family Medicine

## 2020-03-05 ENCOUNTER — Other Ambulatory Visit: Payer: Self-pay

## 2020-03-05 VITALS — BP 133/87 | HR 70 | Temp 97.1°F | Ht 68.3 in | Wt 179.8 lb

## 2020-03-05 DIAGNOSIS — F419 Anxiety disorder, unspecified: Secondary | ICD-10-CM | POA: Diagnosis not present

## 2020-03-05 DIAGNOSIS — M1990 Unspecified osteoarthritis, unspecified site: Secondary | ICD-10-CM | POA: Diagnosis not present

## 2020-03-05 DIAGNOSIS — E039 Hypothyroidism, unspecified: Secondary | ICD-10-CM | POA: Diagnosis not present

## 2020-03-05 DIAGNOSIS — N400 Enlarged prostate without lower urinary tract symptoms: Secondary | ICD-10-CM

## 2020-03-05 DIAGNOSIS — F32A Depression, unspecified: Secondary | ICD-10-CM

## 2020-03-05 DIAGNOSIS — E782 Mixed hyperlipidemia: Secondary | ICD-10-CM | POA: Diagnosis not present

## 2020-03-05 MED ORDER — OMEPRAZOLE 20 MG PO CPDR: 20.0000 mg | DELAYED_RELEASE_CAPSULE | Freq: Two times a day (BID) | ORAL | 1 refills | Status: DC

## 2020-03-05 MED ORDER — MIRTAZAPINE 45 MG PO TABS: 45.0000 mg | ORAL_TABLET | Freq: Every day | ORAL | 1 refills | Status: DC

## 2020-03-05 MED ORDER — LEVOTHYROXINE SODIUM 88 MCG PO TABS: 88.0000 ug | ORAL_TABLET | Freq: Every day | ORAL | 3 refills | Status: DC

## 2020-03-05 MED ORDER — SIMVASTATIN 20 MG PO TABS: ORAL_TABLET | ORAL | 1 refills | Status: DC

## 2020-03-05 NOTE — Progress Notes (Signed)
Subjective:  Patient ID: [name], male    DOB: 1941-02-05  Age: 80 y.o. MRN: [id number]  CC: Medical Management of Chronic Issues (6 month/)   HPI [name] presents for  follow-up of hypertension. Patient has no history of headache chest pain or shortness of breath or recent cough. Patient also denies symptoms of TIA such as focal numbness or weakness. Patient denies side effects from medication. States taking it regularly.   follow-up on  thyroid. The patient has a history of hypothyroidism for many years. It has been stable recently. Pt. denies any change in  voice, loss of hair, heat or cold intolerance. Energy level has been adequate to good. Patient denies constipation and diarrhea. No myxedema. Medication is as noted below. Verified that pt is taking it daily on an empty stomach. Well tolerated.  in for follow-up of elevated cholesterol. Doing well without complaints on current medication. Denies side effects of statin including myalgia and arthralgia and nausea. Currently no chest pain, shortness of breath or other cardiovascular related symptoms noted.  We discussed the CT of his chest from November 2019.  Of note is that a repeat annually been recommended.  At this point he prefers to think about it but feels that he is beyond the age of pursuing these nodules.  He will let me know after he gets back from his trip next month.  History [name] has a past medical history of Allergic rhinitis, cause unspecified, Allergy, Anxiety, Arthritis, Depression, GERD (gastroesophageal reflux disease), Hyperlipidemia, Hypothyroidism, Irritable bowel syndrome, Kidney stones, Obesity, Panic attacks (03/2015), Shoulder pain, right, and Unspecified asthma(493.90).   He has a past surgical history that includes Tonsillectomy; Vasectomy; Rotator cuff repair (2005); Cataract extraction bilateral w/ anterior vitrectomy (2005); and Transurethral resection of prostate (2005).   His family history  includes Breast cancer in his mother; Diabetes in his father; Heart disease in his father; Kidney disease in his mother; Uterine cancer in his mother.He reports that he quit smoking about 51 years ago. His smoking use included cigarettes. He has a 4.00 pack-year smoking history. He has never used smokeless tobacco. He reports that he does not drink alcohol and does not use drugs.  Current Outpatient Medications on File Prior to Visit  Medication Sig Dispense Refill  . ALPRAZolam (XANAX) 0.5 MG tablet Take 0.5 mg by mouth 2 (two) times daily as needed.    Marland Kitchen aspirin 81 MG tablet Take 81 mg by mouth daily.    . Budesonide (RHINOCORT AQUA NA) Place 1 puff into the nose. Three times a week    . Cholecalciferol (VITAMIN D) 2000 UNITS CAPS Take 1 capsule by mouth daily.    Marland Kitchen FLUZONE HIGH-DOSE QUADRIVALENT 0.7 ML SUSY     . Multiple Vitamin (MULTIVITAMIN) tablet Take 1 tablet by mouth daily.    . [DISCONTINUED] DULoxetine (CYMBALTA) 20 MG capsule Take 1 capsule (20 mg total) by mouth daily. Can increase it to two tablets in one week 45 capsule 0   No current facility-administered medications on file prior to visit.    ROS Review of Systems  Constitutional: Negative for fever.  Respiratory: Negative for shortness of breath.   Cardiovascular: Negative for chest pain.  Musculoskeletal: Negative for arthralgias.  Skin: Negative for rash.  Psychiatric/Behavioral: Positive for dysphoric mood (references being alone at the holidays).    Objective:  BP 133/87   Pulse 70   Temp (!) 97.1 F (36.2 C) (Temporal)   Ht 5' 8.3" (1.735  m)   Wt 179 lb 12.8 oz (81.6 kg)   BMI 27.10 kg/m   BP Readings from Last 3 Encounters:  03/05/20 133/87  08/29/19 127/84  06/20/19 120/72    Wt Readings from Last 3 Encounters:  03/05/20 179 lb 12.8 oz (81.6 kg)  08/29/19 170 lb 6 oz (77.3 kg)  02/28/19 173 lb 9.6 oz (78.7 kg)     Physical Exam Vitals reviewed.  Constitutional:      Appearance: He is  well-developed and well-nourished.  HENT:     Head: Normocephalic and atraumatic.     Right Ear: Tympanic membrane and external ear normal. No decreased hearing noted.     Left Ear: Tympanic membrane and external ear normal. No decreased hearing noted.     Mouth/Throat:     Pharynx: No oropharyngeal exudate or posterior oropharyngeal erythema.  Eyes:     Pupils: Pupils are equal, round, and reactive to light.  Cardiovascular:     Rate and Rhythm: Normal rate and regular rhythm.     Heart sounds: No murmur heard.   Pulmonary:     Effort: No respiratory distress.     Breath sounds: Normal breath sounds.  Abdominal:     General: Bowel sounds are normal.     Palpations: Abdomen is soft. There is no mass.     Tenderness: There is no abdominal tenderness.  Musculoskeletal:        General: Normal range of motion.     Cervical back: Normal range of motion and neck supple.       Assessment & Plan:   [name] was seen today for medical management of chronic issues.  Diagnoses and all orders for this visit:  Anxiety and depression -     CBC with Differential/Platelet -     CMP14+EGFR  Arthritis -     CBC with Differential/Platelet -     CMP14+EGFR  Benign prostatic hyperplasia, unspecified whether lower urinary tract symptoms present -     CBC with Differential/Platelet -     CMP14+EGFR  Mixed hyperlipidemia -     CBC with Differential/Platelet -     CMP14+EGFR -     Lipid panel  Hypothyroidism, unspecified type -     CBC with Differential/Platelet -     CMP14+EGFR -     TSH + free T4  Other orders -     levothyroxine (SYNTHROID) 88 MCG tablet; Take 1 tablet (88 mcg total) by mouth daily. -     mirtazapine (REMERON) 45 MG tablet; Take 1 tablet (45 mg total) by mouth at bedtime. -     omeprazole (PRILOSEC) 20 MG capsule; Take 1 capsule (20 mg total) by mouth 2 (two) times daily before a meal. -     simvastatin (ZOCOR) 20 MG tablet; TAKE 1 TABLET BY MOUTH EVERYDAY AT  BEDTIME   Allergies as of 03/05/2020      Reactions   Celexa [citalopram Hydrobromide]    Panic attacks   Cymbalta [duloxetine Hcl]    Panic / anxiety attacks   Procardia [nifedipine] Other (See Comments)   Weakness   Wellbutrin [bupropion] Anxiety   Panic attacks      Medication List       Accurate as of March 05, 2020  3:31 PM. If you have any questions, ask your nurse or doctor.        STOP taking these medications   chlordiazePOXIDE 5 MG capsule Commonly known as: LIBRIUM Stopped by: Claretta Fraise, MD  TAKE these medications   ALPRAZolam 0.5 MG tablet Commonly known as: XANAX Take 0.5 mg by mouth 2 (two) times daily as needed.   aspirin 81 MG tablet Take 81 mg by mouth daily.   Fluzone High-Dose Quadrivalent 0.7 ML Susy Generic drug: Influenza Vac High-Dose Quad   levothyroxine 88 MCG tablet Commonly known as: SYNTHROID Take 1 tablet (88 mcg total) by mouth daily.   mirtazapine 45 MG tablet Commonly known as: REMERON Take 1 tablet (45 mg total) by mouth at bedtime.   multivitamin tablet Take 1 tablet by mouth daily.   omeprazole 20 MG capsule Commonly known as: PRILOSEC Take 1 capsule (20 mg total) by mouth 2 (two) times daily before a meal.   RHINOCORT AQUA NA Place 1 puff into the nose. Three times a week   simvastatin 20 MG tablet Commonly known as: ZOCOR TAKE 1 TABLET BY MOUTH EVERYDAY AT BEDTIME   Vitamin D 50 MCG (2000 UT) Caps Take 1 capsule by mouth daily.       Meds ordered this encounter  Medications  . levothyroxine (SYNTHROID) 88 MCG tablet    Sig: Take 1 tablet (88 mcg total) by mouth daily.    Dispense:  90 tablet    Refill:  3  . mirtazapine (REMERON) 45 MG tablet    Sig: Take 1 tablet (45 mg total) by mouth at bedtime.    Dispense:  90 tablet    Refill:  1  . omeprazole (PRILOSEC) 20 MG capsule    Sig: Take 1 capsule (20 mg total) by mouth 2 (two) times daily before a meal.    Dispense:  180 capsule    Refill:  1   . simvastatin (ZOCOR) 20 MG tablet    Sig: TAKE 1 TABLET BY MOUTH EVERYDAY AT BEDTIME    Dispense:  90 tablet    Refill:  1    His benzodiazepines are prescribed by Chucky May  Follow-up: Return in about 6 months (around 09/02/2020).  Claretta Fraise, M.D.

## 2020-03-06 LAB — LIPID PANEL
Chol/HDL Ratio: 3 ratio (ref 0.0–5.0)
Cholesterol, Total: 149 mg/dL (ref 100–199)
HDL: 50 mg/dL (ref >39)
LDL Chol Calc (NIH): 84 mg/dL (ref 0–99)
Triglycerides: 77 mg/dL (ref 0–149)
VLDL Cholesterol Cal: 15 mg/dL (ref 5–40)

## 2020-03-06 LAB — CMP14+EGFR
ALT: 24 IU/L (ref 0–44)
AST: 27 IU/L (ref 0–40)
Albumin/Globulin Ratio: 1.9 (ref 1.2–2.2)
Albumin: 4.8 g/dL — ABNORMAL HIGH (ref 3.7–4.7)
Alkaline Phosphatase: 79 IU/L (ref 44–121)
BUN/Creatinine Ratio: 17 (ref 10–24)
BUN: 13 mg/dL (ref 8–27)
Bilirubin Total: 0.5 mg/dL (ref 0.0–1.2)
CO2: 26 mmol/L (ref 20–29)
Calcium: 9.4 mg/dL (ref 8.6–10.2)
Chloride: 99 mmol/L (ref 96–106)
Creatinine, Ser: 0.77 mg/dL (ref 0.76–1.27)
GFR calc Af Amer: 100 mL/min/{1.73_m2} (ref >59)
GFR calc non Af Amer: 87 mL/min/{1.73_m2} (ref >59)
Globulin, Total: 2.5 g/dL (ref 1.5–4.5)
Glucose: 99 mg/dL (ref 65–99)
Potassium: 3.9 mmol/L (ref 3.5–5.2)
Sodium: 141 mmol/L (ref 134–144)
Total Protein: 7.3 g/dL (ref 6.0–8.5)

## 2020-03-06 LAB — CBC WITH DIFFERENTIAL/PLATELET
Basophils Absolute: 0 10*3/uL (ref 0.0–0.2)
Basos: 1 %
EOS (ABSOLUTE): 0.2 10*3/uL (ref 0.0–0.4)
Eos: 4 %
Hematocrit: 42.7 % (ref 37.5–51.0)
Hemoglobin: 14.8 g/dL (ref 13.0–17.7)
Immature Grans (Abs): 0 10*3/uL (ref 0.0–0.1)
Immature Granulocytes: 0 %
Lymphocytes Absolute: 2.2 10*3/uL (ref 0.7–3.1)
Lymphs: 40 %
MCH: 30.8 pg (ref 26.6–33.0)
MCHC: 34.7 g/dL (ref 31.5–35.7)
MCV: 89 fL (ref 79–97)
Monocytes Absolute: 0.5 10*3/uL (ref 0.1–0.9)
Monocytes: 8 %
Neutrophils Absolute: 2.7 10*3/uL (ref 1.4–7.0)
Neutrophils: 47 %
Platelets: 234 10*3/uL (ref 150–450)
RBC: 4.81 x10E6/uL (ref 4.14–5.80)
RDW: 11.8 % (ref 11.6–15.4)
WBC: 5.6 10*3/uL (ref 3.4–10.8)

## 2020-03-06 LAB — TSH+FREE T4
Free T4: 1.35 ng/dL (ref 0.82–1.77)
TSH: 1.89 u[IU]/mL (ref 0.450–4.500)

## 2020-03-12 NOTE — Progress Notes (Signed)
Hello [name],  Your lab result is normal and/or stable.Some minor variations that are not significant are commonly marked abnormal, but do not represent any medical problem for you.  Best regards, Claretta Fraise, M.D.

## 2020-05-27 IMAGING — CT CT CHEST W/O CM
2 of 3 series · 15 of 36 positions shown, 18 images · non-contrast
Comparison: 10/16/2014, PET-CT 07/27/2014 and CT 07/12/2014

CLINICAL DATA: Right upper lung nodules follow-up.  Hoarseness.

EXAM:
CT CHEST WITHOUT CONTRAST
TECHNIQUE: Multidetector CT imaging of the chest was performed following the
standard protocol without IV contrast.

[Series 2: thorax · axial · 0.73mm/px · z∈[+1214,+1478]mm · 12 of 156 slices shown, 15 images]
[im 12/156  mediastinal]
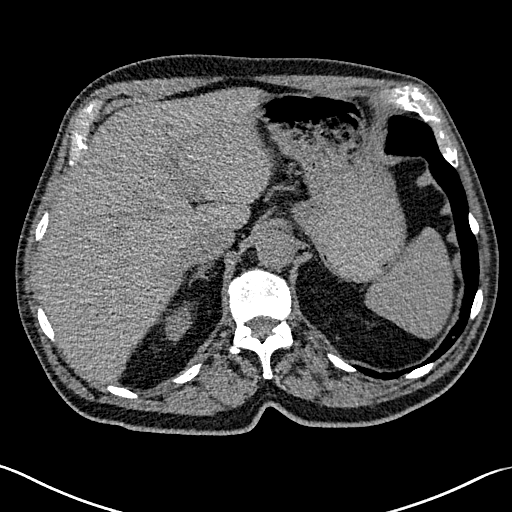
[im 12/156  lung]
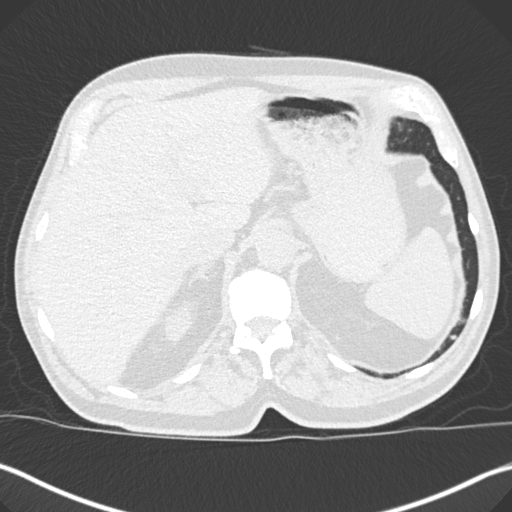
[im 23/156  lung]
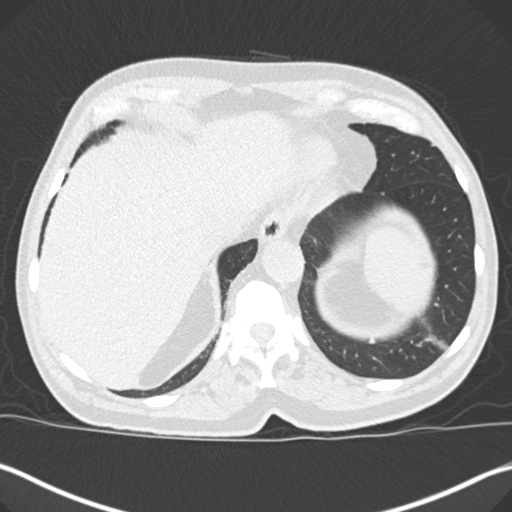
[im 35/156  lung]
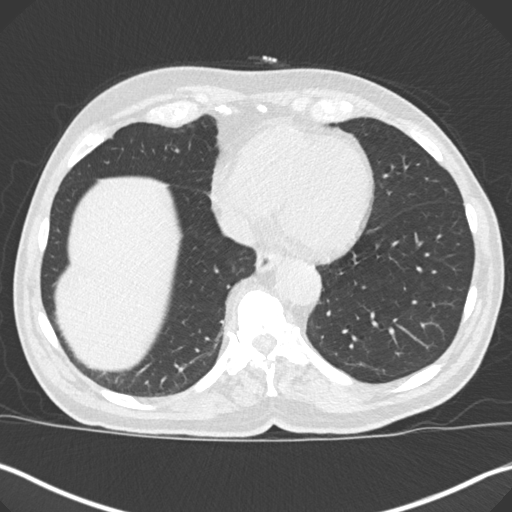
[im 46/156  lung]
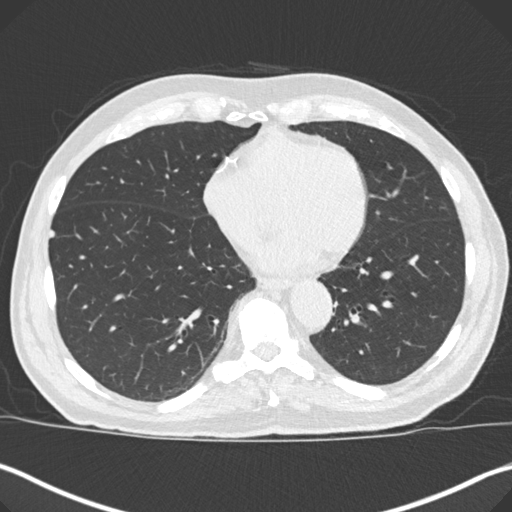
[im 58/156  mediastinal]
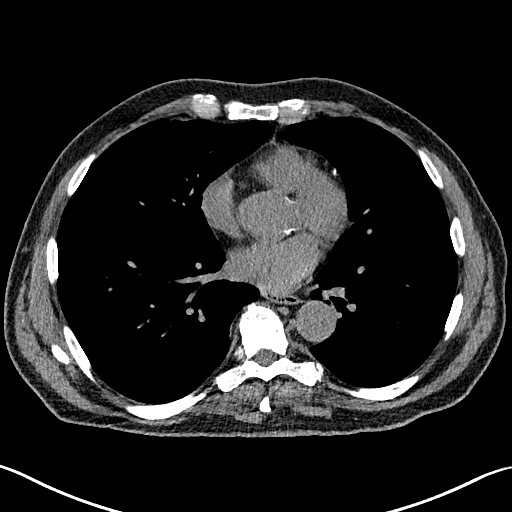
[im 58/156  lung]
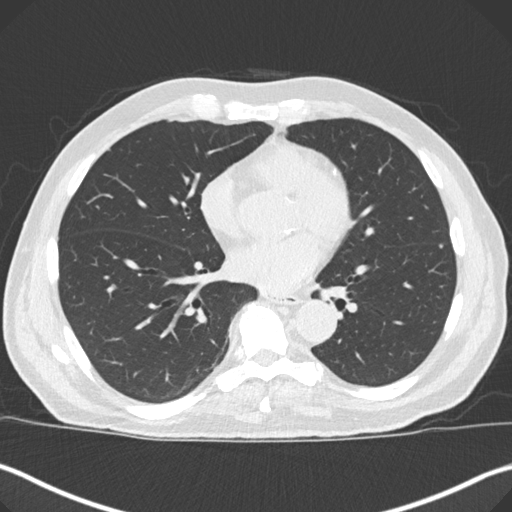
[im 69/156  lung]
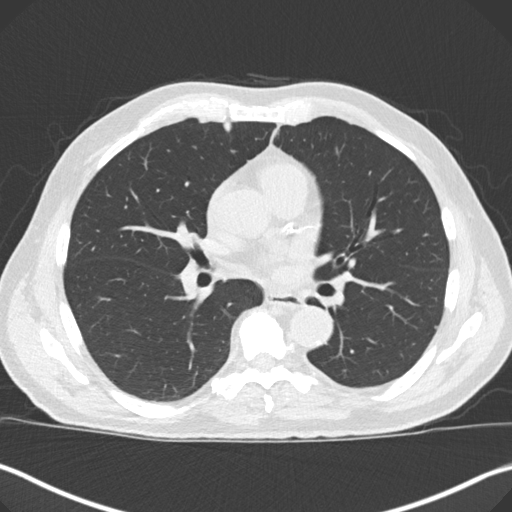
[im 87/156  lung]
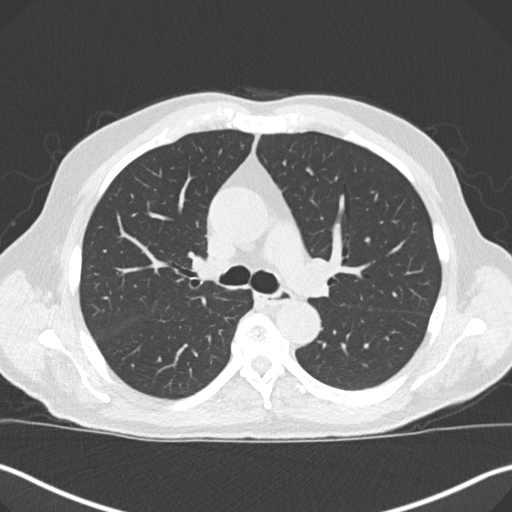
[im 98/156  lung]
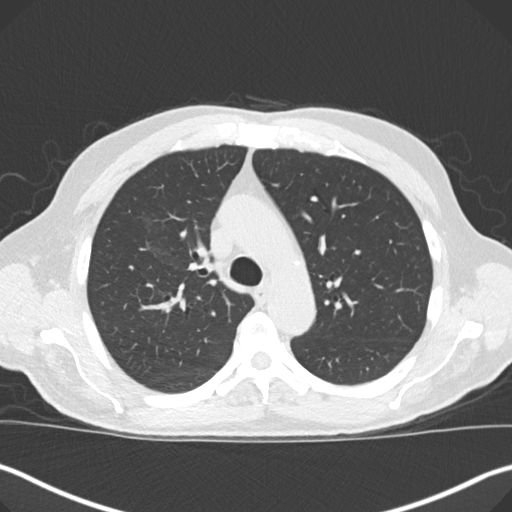
[im 110/156  mediastinal]
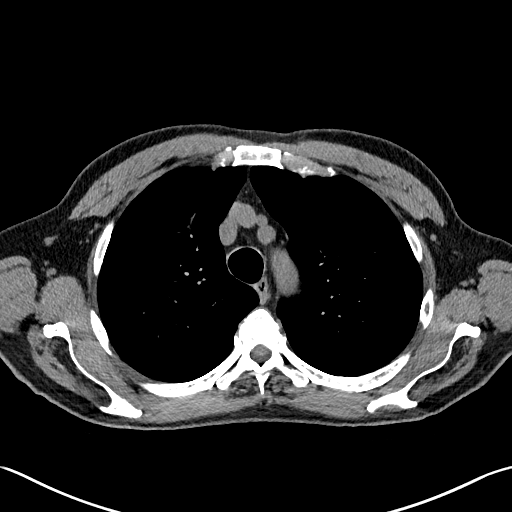
[im 110/156  lung]
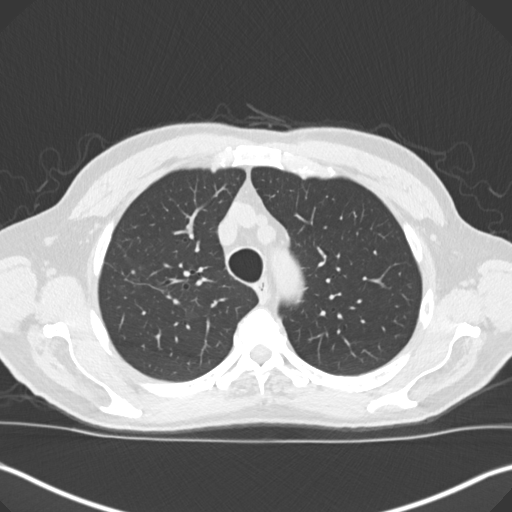
[im 121/156  lung]
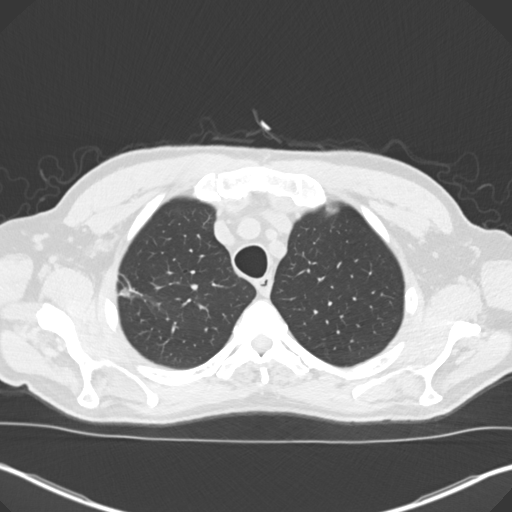
[im 133/156  lung]
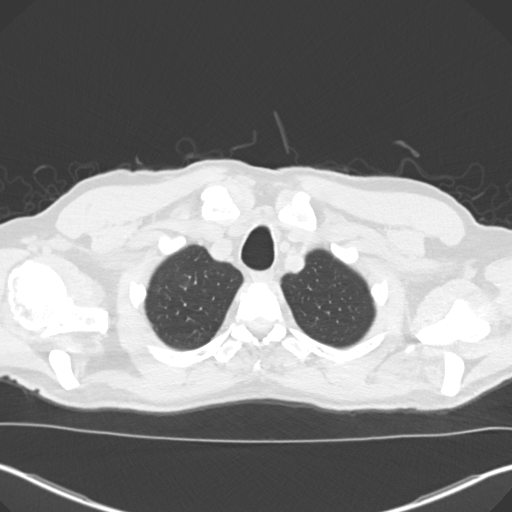
[im 144/156  lung]
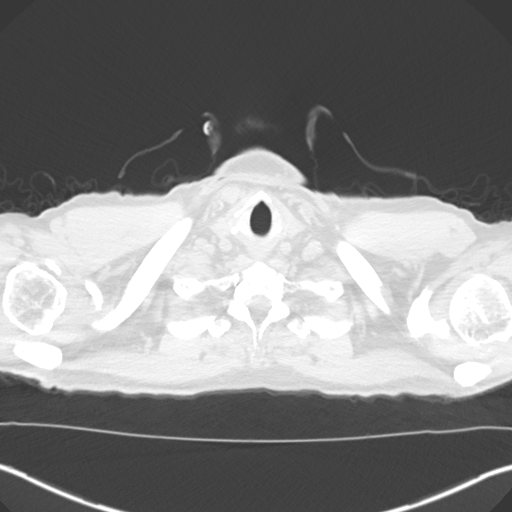

[Series 5: coronal · coronal · 0.63mm/px · 3 of 138 slices shown]
[im 28/138  lung]
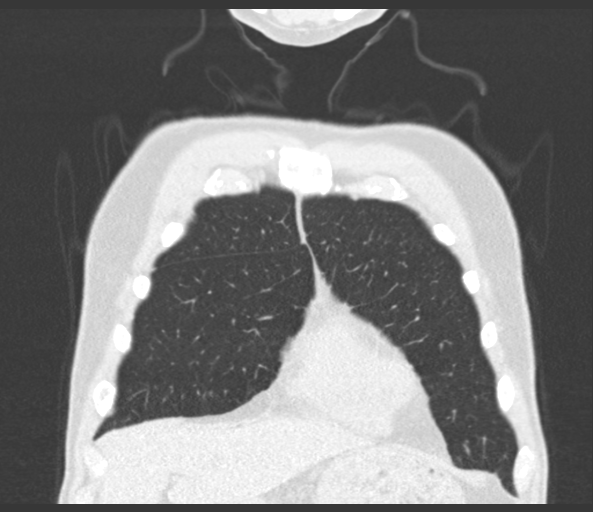
[im 55/138  lung]
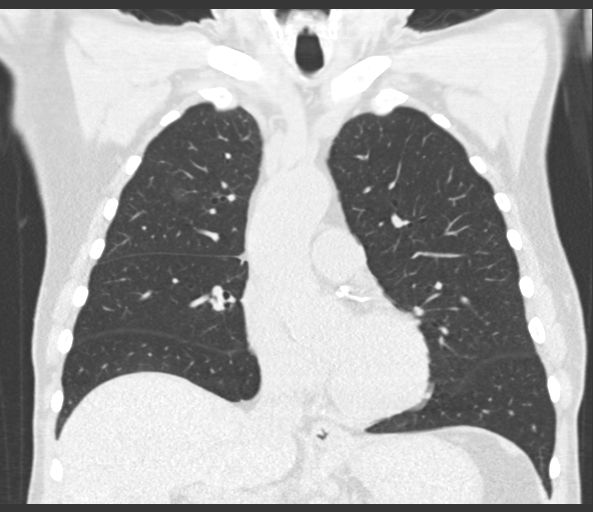
[im 83/138  lung]
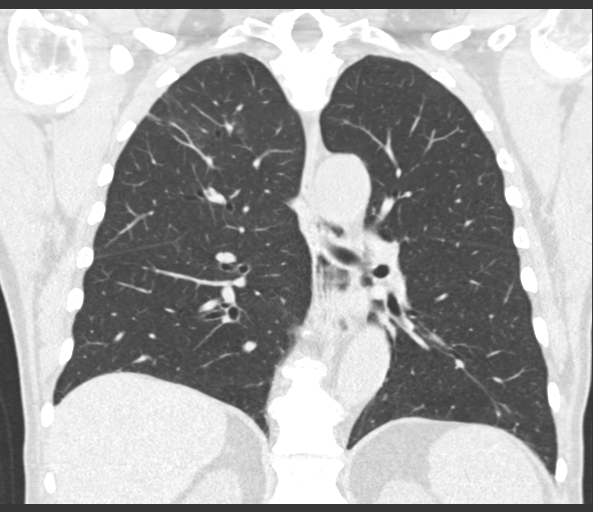

[15 of 36 positions shown; findings below may reference images not displayed]

FINDINGS: Cardiovascular: Heart is normal size. There is calcified plaque over
the left anterior descending, lateral circumflex and right coronary
arteries. Mild aneurysmal dilatation of the ascending thoracic aorta
measuring 4 cm in AP diameter unchanged. Remaining vascular
structures are unremarkable.

Mediastinum/Nodes: No mediastinal or hilar adenopathy. Remaining
mediastinal structures are normal.

Lungs/Pleura: Lungs are adequately inflated without focal airspace
consolidation or effusion. Mild linear density over the right apex
likely scarring. Several small subcentimeter bilateral pulmonary
nodules but most of which are unchanged. There is a new 2 mm nodule
over the right middle lobe. There are a couple new small nodular
densities over the left upper lobe. Largest nodule within the right
lung is over the subpleural region of the anterior right middle lobe
as well as lateral right lower lobe both measuring approximately 6
mm. Largest nodule within the left lung is over the left upper lobe
measuring 5 mm. Airways are normal..

Upper Abdomen: No acute findings.

Musculoskeletal: Degenerative change of the spine.
IMPRESSION: No acute cardiopulmonary disease.

Multiple bilateral subcentimeter pulmonary nodules most of which are
unchanged from September 2014, although there are a few new small
nodules as described. These are likely benign. Recommend follow-up
noncontrast chest CT 1 year. This recommendation follows the
consensus statement: Guidelines for Management of Small Pulmonary
Nodules Detected on CT Scans: A Statement from the [facility]
[URL]

Mild aneurysmal dilatation of the ascending thoracic aorta which is
unchanged measuring 4 cm in AP diameter. Recommend annual imaging
followup by CTA or MRA. This recommendation follows 5909
ACCF/AHA/AATS/ACR/ASA/SCA/SHARNIL/DON LOLITO/MOTLAMMA/JAILTO Guidelines for the
Diagnosis and Management of Patients with Thoracic Aortic Disease.
Circulation. 5909; 121: [id number].

Atherosclerotic coronary artery disease.

## 2020-06-25 ENCOUNTER — Encounter: Payer: Self-pay | Admitting: Allergy

## 2020-06-25 ENCOUNTER — Ambulatory Visit: Payer: Medicare Other | Admitting: Allergy

## 2020-06-25 ENCOUNTER — Other Ambulatory Visit: Payer: Self-pay

## 2020-06-25 VITALS — BP 150/90 | HR 81 | Temp 98.1°F | Resp 16 | Ht 68.5 in | Wt 188.4 lb

## 2020-06-25 DIAGNOSIS — R030 Elevated blood-pressure reading, without diagnosis of hypertension: Secondary | ICD-10-CM | POA: Diagnosis not present

## 2020-06-25 DIAGNOSIS — J4520 Mild intermittent asthma, uncomplicated: Secondary | ICD-10-CM | POA: Diagnosis not present

## 2020-06-25 DIAGNOSIS — J3089 Other allergic rhinitis: Secondary | ICD-10-CM | POA: Diagnosis not present

## 2020-06-25 DIAGNOSIS — K219 Gastro-esophageal reflux disease without esophagitis: Secondary | ICD-10-CM

## 2020-06-25 MED ORDER — ALBUTEROL SULFATE HFA 108 (90 BASE) MCG/ACT IN AERS: 2.0000 | INHALATION_SPRAY | RESPIRATORY_TRACT | 1 refills | Status: DC | PRN

## 2020-06-25 NOTE — Progress Notes (Signed)
Follow Up Note  RE: [name] MRN: [id number] DOB: 04-Dec-1940 Date of Office Visit: 06/25/2020  Referring provider: Claretta Fraise, MD Primary care provider: Claretta Fraise, MD  Chief Complaint: Allergic Rhinitis  (Was told he had no asthma at last office visit. Pt has had no issues up until the last few weeks he believes the dry air and pollen has caused this flare up. )  History of Present Illness: I had the pleasure of seeing [name] for a follow up visit at the [facility] on 06/25/2020. He is a 80 y.o. male, who is being followed for asthma, allergic rhinitis, GERD and epistaxis. His previous allergy office visit was on 06/20/2019 with Dr. Verlin Fester. Today is a new complaint visit of asthma flare/allergic flare.  Mild intermittent asthma Diagnosed with asthma in the 1970s.   Patient noted exhaustion, feeling lousy x few weeks. He is having some croupy cough and chest aches x 1 day. No fevers/chills. Had negative Covid-19 testing.  He doesn't have any inhalers at home currently and has not used ay inhalers in awhile. Patient exercises daily and noted his exercise tolerance is down as well.   Allergic rhinitis In the past patient's allergy test was positive to grass, dust mites, tree, ragweed per patient report. He was on allergy injections in the past per past notes.   Patient always has nasal congestion and currently using Afrin daily at night for the past 40 years and using budesonide 1 spray per nostril MWF. If he doesn't use Afrin at night then he can't go to bed. Patient has cataracts in 2005. Sees his eye doctor once per year and concerned about using the budesonide nasal spray more frequently due to this. No nosebleeds.   He is also using saline wash daily.  No recent ENT evaluation.   GERD (gastroesophageal reflux disease) Still taking omeprazole daily.  Assessment and Plan: [name] is a 80 y.o. male with: Mild intermittent asthma Past  history - diagnosed with asthma in the 1970s. Interim history - no inhaler use recently but noticed feeling "lousy and exhausted" for the past few weeks with decreased exercise tolerance. Sometimes this happened in the past and he would get systemic steroids with good benefit. Coughing x 1 day. No fevers/chills and negative covid-19 testing recently.   Today's spirometry was normal with 3% improvement in FEV1 post bronchodilator treatment. Clinically feeling unchanged.   Discussed with patient at length that he does not need systemic steroids today. Questionable if his environmental allergies may have flared his breathing but his spirometry today was normal albeit not as good as the previous one.  [name] Kitchen START Alvesco 82mcg 2 puffs twice a day with spacer and rinse mouth afterwards x 2 weeks.  o Sample given and demonstrated proper use.  [name] Kitchen Spacer given and demonstrated proper use with inhaler. Patient understood technique and all questions/concerned were addressed.  . Daily controller medication(s): none. . During upper respiratory infections/asthma flares: start Alvesco 63mcg 2 puffs twice a day for 1-2 weeks until your breathing symptoms return to baseline.  . May use albuterol rescue inhaler 2 puffs every 4 to 6 hours as needed for shortness of breath, chest tightness, coughing, and wheezing. May use albuterol rescue inhaler 2 puffs 5 to 15 minutes prior to strenuous physical activities. Monitor frequency of use.  o If exercise tolerance does not improve with albuterol then it is not likely related to his respiratory status.  . Repeat spirometry at next  visit.   Allergic rhinitis Past history - testing in the past positive to grass, dust mites, trees, ragweed per patient report. Used to be on AIT very long time ago. Interim history - nasal congestion still presents but does not want to change below regimen as he has been on it for over 40+ years.  Continue environmental control measures.   May use  over the counter antihistamines such as Zyrtec (cetirizine), Claritin (loratadine), Allegra (fexofenadine), or Xyzal (levocetirizine) daily as needed.  Continue appropriate avoidance measures.   Nasal saline spray (i.e., Simply Saline) or nasal saline lavage (i.e., NeilMed) is recommended as needed and prior to medicated nasal sprays.  May use Afrin at bedtime as before.  Discussed the issue with rebound effects but prefers to use Afrin over increasing budesonide due to his fear of cataracts/glaucoma risks that is on the box for the budesonide nasal spray. Explained that the risks are minimal but not zero and due to this he declines any changes.   Continue budesonide nasal spray 1-2 sprays per nostril on Monday, Wednesday, Friday.   Declines repeat ENT evaluation as well.   Elevated blood pressure reading No prior hypertension diagnosis but states he gets white coat hypertension.  Follow up with PCP regarding blood pressure and fatigue.   Gastroesophageal reflux disease  Continue omeprazole 20mg  daily as before.   Return in about 3 months (around 09/25/2020).   Meds ordered this encounter  Medications  . albuterol (VENTOLIN HFA) 108 (90 Base) MCG/ACT inhaler    Sig: Inhale 2 puffs into the lungs every 4 (four) hours as needed for wheezing or shortness of breath (coughing).    Dispense:  18 g    Refill:  1   Lab Orders  No laboratory test(s) ordered today    Diagnostics: Spirometry:  Tracings reviewed. His effort: Good reproducible efforts. FVC: 3.51L FEV1: 2.70L, 95% predicted FEV1/FVC ratio: 77% Interpretation: Spirometry consistent with normal pattern with 3% improvement in FEV1 post bronchodilator treatment. Clinically feeling unchanged.  .  Please see scanned spirometry results for details.  Medication List:  Current Outpatient Medications  Medication Sig Dispense Refill  . albuterol (VENTOLIN HFA) 108 (90 Base) MCG/ACT inhaler Inhale 2 puffs into the lungs every 4  (four) hours as needed for wheezing or shortness of breath (coughing). 18 g 1  . ALPRAZolam (XANAX) 0.5 MG tablet Take 0.5 mg by mouth 2 (two) times daily as needed.    [name] Kitchen aspirin 81 MG tablet Take 81 mg by mouth daily.    . Budesonide (RHINOCORT AQUA NA) Place 1 puff into the nose. Three times a week    . chlordiazePOXIDE (LIBRIUM) 5 MG capsule Take 5 mg by mouth at bedtime.    . Cholecalciferol (VITAMIN D) 2000 UNITS CAPS Take 1 capsule by mouth daily.    [name] Kitchen levothyroxine (SYNTHROID) 88 MCG tablet Take 1 tablet (88 mcg total) by mouth daily. 90 tablet 3  . mirtazapine (REMERON) 45 MG tablet Take 1 tablet (45 mg total) by mouth at bedtime. 90 tablet 1  . Multiple Vitamin (MULTIVITAMIN) tablet Take 1 tablet by mouth daily.    [name] Kitchen omeprazole (PRILOSEC) 20 MG capsule Take 1 capsule (20 mg total) by mouth 2 (two) times daily before a meal. 180 capsule 1  . simvastatin (ZOCOR) 20 MG tablet TAKE 1 TABLET BY MOUTH EVERYDAY AT BEDTIME 90 tablet 1   No current facility-administered medications for this visit.   Allergies: Allergies  Allergen Reactions  . Celexa [Citalopram Hydrobromide]  Panic attacks  . Cymbalta [Duloxetine Hcl]     Panic / anxiety attacks  . Procardia [Nifedipine] Other (See Comments)    Weakness   . Wellbutrin [Bupropion] Anxiety    Panic attacks   I reviewed his past medical history, social history, family history, and environmental history and no significant changes have been reported from his previous visit.  Review of Systems  Constitutional: Positive for fatigue. Negative for appetite change, chills, fever and unexpected weight change.  HENT: Positive for congestion. Negative for rhinorrhea.   Eyes: Negative for itching.  Respiratory: Positive for cough. Negative for chest tightness, shortness of breath and wheezing.   Gastrointestinal: Negative for abdominal pain.  Skin: Negative for rash.  Neurological: Negative for headaches.   Objective: BP (!) 150/90    Pulse 81   Temp 98.1 F (36.7 C) (Temporal)   Resp 16   Ht 5' 8.5" (1.74 m)   Wt 188 lb 6.4 oz (85.5 kg)   SpO2 95%   BMI 28.23 kg/m  Body mass index is 28.23 kg/m. Physical Exam Vitals and nursing note reviewed.  Constitutional:      Appearance: Normal appearance. He is well-developed.  HENT:     Head: Normocephalic and atraumatic.     Right Ear: External ear normal.     Left Ear: External ear normal.     Nose: Nose normal.     Mouth/Throat:     Mouth: Mucous membranes are moist.     Pharynx: Oropharynx is clear.  Eyes:     Conjunctiva/sclera: Conjunctivae normal.  Cardiovascular:     Rate and Rhythm: Normal rate and regular rhythm.     Heart sounds: Normal heart sounds. No murmur heard.   Pulmonary:     Effort: Pulmonary effort is normal.     Breath sounds: Normal breath sounds. No wheezing, rhonchi or rales.  Musculoskeletal:     Cervical back: Neck supple.  Skin:    General: Skin is warm.     Findings: No rash.  Neurological:     Mental Status: He is alert and oriented to person, place, and time.  Psychiatric:        Behavior: Behavior normal.    Previous notes and tests were reviewed. The plan was reviewed with the patient/family, and all questions/concerned were addressed.  It was my pleasure to see [name] today and participate in his care. Please feel free to contact me with any questions or concerns.  Sincerely,  Rexene Alberts, DO Allergy & Immunology  [facility] office: [place] office: [phone]

## 2020-06-25 NOTE — Assessment & Plan Note (Signed)
Past history - testing in the past positive to grass, dust mites, trees, ragweed per patient report. Used to be on AIT very long time ago. Interim history - nasal congestion still presents but does not want to change below regimen as he has been on it for over 40+ years.  Continue environmental control measures.   May use over the counter antihistamines such as Zyrtec (cetirizine), Claritin (loratadine), Allegra (fexofenadine), or Xyzal (levocetirizine) daily as needed.  Continue appropriate avoidance measures.   Nasal saline spray (i.e., Simply Saline) or nasal saline lavage (i.e., NeilMed) is recommended as needed and prior to medicated nasal sprays.  May use Afrin at bedtime as before.  Discussed the issue with rebound effects but prefers to use Afrin over increasing budesonide due to his fear of cataracts/glaucoma risks that is on the box for the budesonide nasal spray. Explained that the risks are minimal but not zero and due to this he declines any changes.   Continue budesonide nasal spray 1-2 sprays per nostril on Monday, Wednesday, Friday.   Declines repeat ENT evaluation as well.

## 2020-06-25 NOTE — Assessment & Plan Note (Signed)
No prior hypertension diagnosis but states he gets white coat hypertension.  Follow up with PCP regarding blood pressure and fatigue.

## 2020-06-25 NOTE — Patient Instructions (Addendum)
Asthma: . START Alvesco 16mcg 2 puffs twice a day with spacer and rinse mouth afterwards x 2 weeks.  o Sample given and demonstrated proper use.  Marland Kitchen Spacer given and demonstrated proper use with inhaler. Patient understood technique and all questions/concerned were addressed.  . Daily controller medication(s): none. . During upper respiratory infections/asthma flares: start Alvesco 49mcg 2 puffs twice a day for 1-2 weeks until your breathing symptoms return to baseline.  . May use albuterol rescue inhaler 2 puffs every 4 to 6 hours as needed for shortness of breath, chest tightness, coughing, and wheezing. May use albuterol rescue inhaler 2 puffs 5 to 15 minutes prior to strenuous physical activities. Monitor frequency of use.  . Asthma control goals:  o Full participation in all desired activities (may need albuterol before activity) o Albuterol use two times or less a week on average (not counting use with activity) o Cough interfering with sleep two times or less a month o Oral steroids no more than once a year o No hospitalizations  Allergic rhinitis  May use over the counter antihistamines such as Zyrtec (cetirizine), Claritin (loratadine), Allegra (fexofenadine), or Xyzal (levocetirizine) daily as needed.  Continue appropriate avoidance measures.   Nasal saline spray (i.e., Simply Saline) or nasal saline lavage (i.e., NeilMed) is recommended as needed and prior to medicated nasal sprays.  May use Afrin at bedtime as before.  Continue budesonide nasal spray 1-2 sprays per nostril on Monday, Wednesday, Friday.   Follow up in 3 months or sooner if needed.  Otherwise will see you once per year. Follow up with PCP regarding your blood pressure and fatigue.   Reducing Pollen Exposure . Pollen seasons: trees (spring), grass (summer) and ragweed/weeds (fall). Marland Kitchen Keep windows closed in your home and car to lower pollen exposure.  Susa Simmonds air conditioning in the bedroom and throughout  the house if possible.  . Avoid going out in dry windy days - especially early morning. . Pollen counts are highest between 5 - 10 AM and on dry, hot and windy days.  . Save outside activities for late afternoon or after a heavy rain, when pollen levels are lower.  . Avoid mowing of grass if you have grass pollen allergy. Marland Kitchen Be aware that pollen can also be transported indoors on people and pets.  . Dry your clothes in an automatic dryer rather than hanging them outside where they might collect pollen.  . Rinse hair and eyes before bedtime.  Control of House Dust Mite Allergen . Dust mite allergens are a common trigger of allergy and asthma symptoms. While they can be found throughout the house, these microscopic creatures thrive in warm, humid environments such as bedding, upholstered furniture and carpeting. . Because so much time is spent in the bedroom, it is essential to reduce mite levels there.  . Encase pillows, mattresses, and box springs in special allergen-proof fabric covers or airtight, zippered plastic covers.  . Bedding should be washed weekly in hot water (130 F) and dried in a hot dryer. Allergen-proof covers are available for comforters and pillows that can't be regularly washed.  Wendee Copp the allergy-proof covers every few months. Minimize clutter in the bedroom. Keep pets out of the bedroom.  Marland Kitchen Keep humidity less than 50% by using a dehumidifier or air conditioning. You can buy a humidity measuring device called a hygrometer to monitor this.  . If possible, replace carpets with hardwood, linoleum, or washable area rugs. If that's not possible, vacuum frequently  with a vacuum that has a HEPA filter. . Remove all upholstered furniture and non-washable window drapes from the bedroom. . Remove all non-washable stuffed toys from the bedroom.  Wash stuffed toys weekly.

## 2020-06-25 NOTE — Assessment & Plan Note (Signed)
   Continue omeprazole 20mg  daily as before.

## 2020-06-25 NOTE — Assessment & Plan Note (Signed)
Past history - diagnosed with asthma in the 1970s. Interim history - no inhaler use recently but noticed feeling "lousy and exhausted" for the past few weeks with decreased exercise tolerance. Sometimes this happened in the past and he would get systemic steroids with good benefit. Coughing x 1 day. No fevers/chills and negative covid-19 testing recently.   Today's spirometry was normal with 3% improvement in FEV1 post bronchodilator treatment. Clinically feeling unchanged.   Discussed with patient at length that he does not need systemic steroids today. Questionable if his environmental allergies may have flared his breathing but his spirometry today was normal albeit not as good as the previous one.  Marland Kitchen START Alvesco 54mcg 2 puffs twice a day with spacer and rinse mouth afterwards x 2 weeks.  o Sample given and demonstrated proper use.  Marland Kitchen Spacer given and demonstrated proper use with inhaler. Patient understood technique and all questions/concerned were addressed.  . Daily controller medication(s): none. . During upper respiratory infections/asthma flares: start Alvesco 77mcg 2 puffs twice a day for 1-2 weeks until your breathing symptoms return to baseline.  . May use albuterol rescue inhaler 2 puffs every 4 to 6 hours as needed for shortness of breath, chest tightness, coughing, and wheezing. May use albuterol rescue inhaler 2 puffs 5 to 15 minutes prior to strenuous physical activities. Monitor frequency of use.  o If exercise tolerance does not improve with albuterol then it is not likely related to his respiratory status.  . Repeat spirometry at next visit.

## 2020-08-08 ENCOUNTER — Encounter: Payer: Self-pay | Admitting: Family Medicine

## 2020-08-08 ENCOUNTER — Ambulatory Visit (INDEPENDENT_AMBULATORY_CARE_PROVIDER_SITE_OTHER): Payer: Medicare Other | Admitting: Family Medicine

## 2020-08-08 ENCOUNTER — Other Ambulatory Visit: Payer: Self-pay

## 2020-08-08 VITALS — BP 137/85 | HR 76 | Temp 98.6°F | Ht 68.5 in | Wt 184.8 lb

## 2020-08-08 DIAGNOSIS — E039 Hypothyroidism, unspecified: Secondary | ICD-10-CM

## 2020-08-08 DIAGNOSIS — R5383 Other fatigue: Secondary | ICD-10-CM

## 2020-08-08 DIAGNOSIS — R011 Cardiac murmur, unspecified: Secondary | ICD-10-CM

## 2020-08-08 DIAGNOSIS — R6889 Other general symptoms and signs: Secondary | ICD-10-CM | POA: Diagnosis not present

## 2020-08-08 NOTE — Progress Notes (Signed)
Subjective:  Patient ID: [name], male    DOB: 06/24/1940  Age: 80 y.o. MRN: [id number]  CC: No chief complaint on file.   HPI [name] presents for loss of energy, fatigue. Onset three months ago. Exercise ability is decreasing. Waking up not feeling rested. ONly up once to urinate. I wake up feeling like "dog poop."   No chest pain or dyspnea. Denies edema of legs or abdomen.   follow-up on  thyroid. The patient has a history of hypothyroidism for many years. It has been stable recently. Pt. denies any change in  voice, loss of hair, heat or cold intolerance. Energy level has been poor. Patient denies constipation and diarrhea. No myxedema. Medication is as noted below. Verified that pt is taking it daily on an empty stomach. Well tolerated.   Depression screen [facility] 2/9 08/08/2020 03/05/2020 08/29/2019  Decreased Interest 0 0 0  Down, Depressed, Hopeless 0 1 1  PHQ - 2 Score 0 1 1  Altered sleeping - - -  Tired, decreased energy - - -  Change in appetite - - -  Feeling bad or failure about yourself  - - -  Trouble concentrating - - -  Moving slowly or fidgety/restless - - -  Suicidal thoughts - - -  PHQ-9 Score - - -  Difficult doing work/chores - - -  Some recent data might be hidden    History [name] has a past medical history of Allergic rhinitis, cause unspecified, Allergy, Anxiety, Arthritis, Depression, GERD (gastroesophageal reflux disease), Hyperlipidemia, Hypothyroidism, Irritable bowel syndrome, Kidney stones, Obesity, Panic attacks (03/2015), Shoulder pain, right, and Unspecified asthma(493.90).   He has a past surgical history that includes Tonsillectomy; Vasectomy; Rotator cuff repair (2005); Cataract extraction bilateral w/ anterior vitrectomy (2005); and Transurethral resection of prostate (2005).   His family history includes Breast cancer in his mother; Diabetes in his father; Heart disease in his father; Kidney disease in his mother; Uterine cancer in  his mother.He reports that he quit smoking about 51 years ago. His smoking use included cigarettes. He has a 4.00 pack-year smoking history. He has never used smokeless tobacco. He reports that he does not drink alcohol and does not use drugs.    ROS Review of Systems  Constitutional: Negative.   HENT: Negative.    Eyes:  Negative for visual disturbance.  Respiratory:  Negative for cough and shortness of breath.   Cardiovascular:  Negative for chest pain and leg swelling.  Gastrointestinal:  Negative for abdominal pain, diarrhea, nausea and vomiting.  Genitourinary:  Negative for difficulty urinating.  Musculoskeletal:  Negative for arthralgias and myalgias.  Skin:  Negative for rash.  Neurological:  Negative for headaches.  Psychiatric/Behavioral:  Negative for sleep disturbance.    Objective:  BP 137/85   Pulse 76   Temp 98.6 F (37 C)   Ht 5' 8.5" (1.74 m)   Wt 184 lb 12.8 oz (83.8 kg)   SpO2 96%   BMI 27.69 kg/m   BP Readings from Last 3 Encounters:  08/08/20 137/85  06/25/20 (!) 150/90  03/05/20 133/87    Wt Readings from Last 3 Encounters:  08/08/20 184 lb 12.8 oz (83.8 kg)  06/25/20 188 lb 6.4 oz (85.5 kg)  03/05/20 179 lb 12.8 oz (81.6 kg)     Physical Exam Vitals reviewed.  Constitutional:      Appearance: He is well-developed.  HENT:     Head: Normocephalic and atraumatic.     Right  Ear: External ear normal.     Left Ear: External ear normal.     Mouth/Throat:     Pharynx: No oropharyngeal exudate or posterior oropharyngeal erythema.  Eyes:     Pupils: Pupils are equal, round, and reactive to light.  Cardiovascular:     Rate and Rhythm: Normal rate and regular rhythm.     Heart sounds: No murmur heard. Pulmonary:     Effort: No respiratory distress.     Breath sounds: Normal breath sounds.  Musculoskeletal:        General: Normal range of motion.     Cervical back: Normal range of motion and neck supple.  Neurological:     Mental Status: He is  alert and oriented to person, place, and time.      Assessment & Plan:   Diagnoses and all orders for this visit:  Fatigue, unspecified type -     CBC with Differential/Platelet -     CMP14+EGFR -     Testosterone,Free and Total -     TSH + free T4 -     Lipid panel -     VITAMIN D 25 Hydroxy (Vit-D Deficiency, Fractures) -     Vitamin B12  Newly recognized heart murmur -     CBC with Differential/Platelet -     CMP14+EGFR -     Testosterone,Free and Total -     TSH + free T4 -     Lipid panel -     VITAMIN D 25 Hydroxy (Vit-D Deficiency, Fractures) -     Vitamin B12 -     Ambulatory referral to Cardiology  Hypothyroidism, unspecified type -     CBC with Differential/Platelet -     CMP14+EGFR -     Testosterone,Free and Total -     TSH + free T4 -     Lipid panel -     VITAMIN D 25 Hydroxy (Vit-D Deficiency, Fractures) -     Vitamin B12      I am having [name] maintain his multivitamin, Vitamin D, aspirin, Budesonide (RHINOCORT AQUA NA), ALPRAZolam, levothyroxine, mirtazapine, omeprazole, simvastatin, chlordiazePOXIDE, and albuterol.  Allergies as of 08/08/2020       Reactions   Celexa [citalopram Hydrobromide]    Panic attacks   Cymbalta [duloxetine Hcl]    Panic / anxiety attacks   Procardia [nifedipine] Other (See Comments)   Weakness   Wellbutrin [bupropion] Anxiety   Panic attacks        Medication List        Accurate as of August 08, 2020  9:20 AM. If you have any questions, ask your nurse or doctor.          albuterol 108 (90 Base) MCG/ACT inhaler Commonly known as: VENTOLIN HFA Inhale 2 puffs into the lungs every 4 (four) hours as needed for wheezing or shortness of breath (coughing).   ALPRAZolam 0.5 MG tablet Commonly known as: XANAX Take 0.5 mg by mouth 2 (two) times daily as needed.   aspirin 81 MG tablet Take 81 mg by mouth daily.   chlordiazePOXIDE 5 MG capsule Commonly known as: LIBRIUM Take 5 mg by mouth at  bedtime.   levothyroxine 88 MCG tablet Commonly known as: SYNTHROID Take 1 tablet (88 mcg total) by mouth daily.   mirtazapine 45 MG tablet Commonly known as: REMERON Take 1 tablet (45 mg total) by mouth at bedtime.   multivitamin tablet Take 1 tablet by mouth daily.  omeprazole 20 MG capsule Commonly known as: PRILOSEC Take 1 capsule (20 mg total) by mouth 2 (two) times daily before a meal.   RHINOCORT AQUA NA Place 1 puff into the nose. Three times a week   simvastatin 20 MG tablet Commonly known as: ZOCOR TAKE 1 TABLET BY MOUTH EVERYDAY AT BEDTIME   Vitamin D 50 MCG (2000 UT) Caps Take 1 capsule by mouth daily.         Follow-up: No follow-ups on file.  Claretta Fraise, M.D.

## 2020-08-10 LAB — CBC WITH DIFFERENTIAL/PLATELET
Basophils Absolute: 0 10*3/uL (ref 0.0–0.2)
Basos: 1 %
EOS (ABSOLUTE): 0.3 10*3/uL (ref 0.0–0.4)
Eos: 5 %
Hematocrit: 43.1 % (ref 37.5–51.0)
Hemoglobin: 14.6 g/dL (ref 13.0–17.7)
Immature Grans (Abs): 0 10*3/uL (ref 0.0–0.1)
Immature Granulocytes: 0 %
Lymphocytes Absolute: 2.7 10*3/uL (ref 0.7–3.1)
Lymphs: 42 %
MCH: 30.5 pg (ref 26.6–33.0)
MCHC: 33.9 g/dL (ref 31.5–35.7)
MCV: 90 fL (ref 79–97)
Monocytes Absolute: 0.6 10*3/uL (ref 0.1–0.9)
Monocytes: 9 %
Neutrophils Absolute: 2.7 10*3/uL (ref 1.4–7.0)
Neutrophils: 43 %
Platelets: 230 10*3/uL (ref 150–450)
RBC: 4.78 x10E6/uL (ref 4.14–5.80)
RDW: 12.3 % (ref 11.6–15.4)
WBC: 6.3 10*3/uL (ref 3.4–10.8)

## 2020-08-10 LAB — VITAMIN B12: Vitamin B-12: 1193 pg/mL (ref 232–1245)

## 2020-08-10 LAB — LIPID PANEL
Chol/HDL Ratio: 3.3 ratio (ref 0.0–5.0)
Cholesterol, Total: 133 mg/dL (ref 100–199)
HDL: 40 mg/dL (ref >39)
LDL Chol Calc (NIH): 78 mg/dL (ref 0–99)
Triglycerides: 76 mg/dL (ref 0–149)
VLDL Cholesterol Cal: 15 mg/dL (ref 5–40)

## 2020-08-10 LAB — TESTOSTERONE,FREE AND TOTAL
Testosterone, Free: 7.9 pg/mL (ref 6.6–18.1)
Testosterone: 337 ng/dL (ref 264–916)

## 2020-08-10 LAB — CMP14+EGFR
ALT: 19 IU/L (ref 0–44)
AST: 19 IU/L (ref 0–40)
Albumin/Globulin Ratio: 2.2 (ref 1.2–2.2)
Albumin: 4.7 g/dL (ref 3.7–4.7)
Alkaline Phosphatase: 87 IU/L (ref 44–121)
BUN/Creatinine Ratio: 18 (ref 10–24)
BUN: 16 mg/dL (ref 8–27)
Bilirubin Total: 0.4 mg/dL (ref 0.0–1.2)
CO2: 20 mmol/L (ref 20–29)
Calcium: 9.3 mg/dL (ref 8.6–10.2)
Chloride: 100 mmol/L (ref 96–106)
Creatinine, Ser: 0.88 mg/dL (ref 0.76–1.27)
Globulin, Total: 2.1 g/dL (ref 1.5–4.5)
Glucose: 111 mg/dL — ABNORMAL HIGH (ref 65–99)
Potassium: 4.4 mmol/L (ref 3.5–5.2)
Sodium: 140 mmol/L (ref 134–144)
Total Protein: 6.8 g/dL (ref 6.0–8.5)
eGFR: 88 mL/min/{1.73_m2} (ref >59)

## 2020-08-10 LAB — TSH+FREE T4
Free T4: 1.32 ng/dL (ref 0.82–1.77)
TSH: 4.71 u[IU]/mL — ABNORMAL HIGH (ref 0.450–4.500)

## 2020-08-10 LAB — VITAMIN D 25 HYDROXY (VIT D DEFICIENCY, FRACTURES): Vit D, 25-Hydroxy: 57.8 ng/mL (ref 30.0–100.0)

## 2020-08-19 ENCOUNTER — Other Ambulatory Visit: Payer: Self-pay | Admitting: Family Medicine

## 2020-09-03 ENCOUNTER — Encounter: Payer: Medicare Other | Admitting: Family Medicine

## 2020-10-17 ENCOUNTER — Encounter: Payer: Self-pay | Admitting: Cardiology

## 2020-10-17 ENCOUNTER — Other Ambulatory Visit: Payer: Self-pay

## 2020-10-17 ENCOUNTER — Ambulatory Visit: Payer: Medicare Other | Admitting: Cardiology

## 2020-10-17 VITALS — BP 170/96 | HR 75 | Ht 68.5 in | Wt 188.8 lb

## 2020-10-17 DIAGNOSIS — R200 Anesthesia of skin: Secondary | ICD-10-CM | POA: Diagnosis not present

## 2020-10-17 DIAGNOSIS — E785 Hyperlipidemia, unspecified: Secondary | ICD-10-CM

## 2020-10-17 DIAGNOSIS — R030 Elevated blood-pressure reading, without diagnosis of hypertension: Secondary | ICD-10-CM

## 2020-10-17 DIAGNOSIS — R011 Cardiac murmur, unspecified: Secondary | ICD-10-CM | POA: Diagnosis not present

## 2020-10-17 NOTE — Patient Instructions (Signed)
Medication Instructions:  Your physician recommends that you continue on your current medications as directed. Please refer to the Current Medication list given to you today.  Testing/Procedures: Your physician has requested that you have an echocardiogram. Echocardiography is a painless test that uses sound waves to create images of your heart. It provides your doctor with information about the size and shape of your heart and how well your heart's chambers and valves are working. This procedure takes approximately one hour. There are no restrictions for this procedure.  This will be done at our [facility] location:  [place] has requested that you have an ankle brachial index (ABI). During this test an ultrasound and blood pressure cuff are used to evaluate the arteries that supply the arms and legs with blood. Allow thirty minutes for this exam. There are no restrictions or special instructions.  Follow-Up: At [facility], you and your health needs are our priority.  As part of our continuing mission to provide you with exceptional heart care, we have created designated Provider Care Teams.  These Care Teams include your primary Cardiologist (physician) and Advanced Practice Providers (APPs -  Physician Assistants and Nurse Practitioners) who all work together to provide you with the care you need, when you need it.  We recommend signing up for the patient portal called "MyChart".  Sign up information is provided on this After Visit Summary.  MyChart is used to connect with patients for Virtual Visits (Telemedicine).  Patients are able to view lab/test results, encounter notes, upcoming appointments, etc.  Non-urgent messages can be sent to your provider as well.   To learn more about what you can do with MyChart, go to [email or web address].    Your next appointment:   6 month(s)  The format for your next appointment:   In Person  Provider:    Oswaldo Milian, MD   Other Instructions Please check your blood pressure at home daily, write it down.  Call the office or send message via Mychart with the readings in 1 week for Dr. Gardiner Rhyme to review.

## 2020-10-17 NOTE — Progress Notes (Signed)
Cardiology Office Note:    Date:  10/17/2020   ID:  [name], DOB 06-Aug-1940, MRN [id number]  PCP:  Claretta Fraise, MD  Cardiologist:  None  Electrophysiologist:  None   Referring MD: Claretta Fraise, MD   Chief Complaint  Patient presents with   Heart Murmur     History of Present Illness:    [name] is a 80 y.o. male with a hx of hyperlipidemia, hypothyroidism who is referred by Dr. Livia Snellen for evaluation of heart murmur.  He denies any chest pain, dyspnea, headedness, syncope, lower extremity edema, or palpitations.  He works out for 1 hour/day on [employer] track, denies any exertional symptoms.  Does report he has issues with toes feeling cold and numb.  Reports has history of whitecoat hypertension.  Has BP monitor at home but does not check regularly.  He smoked off and on until 1970.  Quit drinking alcohol in 1980.  Reports father had heart issues but he did not have any contact with his father and does not know details.    BP Readings from Last 3 Encounters:  10/17/20 (!) 170/96  08/08/20 137/85  06/25/20 (!) 150/90       Past Medical History:  Diagnosis Date   Allergic rhinitis, cause unspecified    Allergy    a lot seasonal allergies   Anxiety    Arthritis    degenerative arthritis of the neck   Depression    GERD (gastroesophageal reflux disease)    Hyperlipidemia    Hypothyroidism    Irritable bowel syndrome    Kidney stones    per pt, not sure if had kidney stones.   Obesity    Panic attacks 03/2015   Shoulder pain, right    Unspecified asthma(493.90)     Past Surgical History:  Procedure Laterality Date   CATARACT EXTRACTION BILATERAL W/ ANTERIOR VITRECTOMY  2005   ROTATOR CUFF REPAIR  2005   Left Shoulder   TONSILLECTOMY     TRANSURETHRAL RESECTION OF PROSTATE  2005   VASECTOMY      Current Medications: Current Meds  Medication Sig   albuterol (VENTOLIN HFA) 108 (90 Base) MCG/ACT inhaler Inhale 2 puffs into the lungs every  4 (four) hours as needed for wheezing or shortness of breath (coughing).   ALPRAZolam (XANAX) 0.5 MG tablet Take 0.5 mg by mouth 2 (two) times daily as needed.   aspirin 81 MG tablet Take 81 mg by mouth daily.   Budesonide (RHINOCORT AQUA NA) Place 1 puff into the nose. Three times a week   chlordiazePOXIDE (LIBRIUM) 5 MG capsule Take 5 mg by mouth at bedtime.   Cholecalciferol (VITAMIN D) 2000 UNITS CAPS Take 1 capsule by mouth daily.   levothyroxine (SYNTHROID) 88 MCG tablet Take 1 tablet (88 mcg total) by mouth daily.   mirtazapine (REMERON) 45 MG tablet Take 1 tablet (45 mg total) by mouth at bedtime.   Multiple Vitamin (MULTIVITAMIN) tablet Take 1 tablet by mouth daily.   omeprazole (PRILOSEC) 20 MG capsule Take 1 capsule (20 mg total) by mouth 2 (two) times daily before a meal.   simvastatin (ZOCOR) 20 MG tablet TAKE 1 TABLET BY MOUTH EVERYDAY AT BEDTIME     Allergies:   Celexa [citalopram hydrobromide], Cymbalta [duloxetine hcl], Procardia [nifedipine], and Wellbutrin [bupropion]   Social History   Socioeconomic History   Marital status: Widowed    Spouse name: Not on file   Number of children: Not on file  Years of education: Not on file   Highest education level: Not on file  Occupational History   Occupation: retired  Tobacco Use   Smoking status: Former    Packs/day: 2.00    Years: 2.00    Pack years: 4.00    Types: Cigarettes    Quit date: 02/23/1969    Years since quitting: 51.6   Smokeless tobacco: Never  Vaping Use   Vaping Use: Never used  Substance and Sexual Activity   Alcohol use: No    Alcohol/week: 0.0 standard drinks   Drug use: No   Sexual activity: Not on file  Other Topics Concern   Not on file  Social History Narrative   Not on file   Social Determinants of Health   Financial Resource Strain: Not on file  Food Insecurity: Not on file  Transportation Needs: Not on file  Physical Activity: Not on file  Stress: Not on file  Social  Connections: Not on file     Family History: The patient's family history includes Breast cancer in his mother; Diabetes in his father; Heart disease in his father; Kidney disease in his mother; Uterine cancer in his mother.  ROS:   Please see the history of present illness.     All other systems reviewed and are negative.  EKGs/Labs/Other Studies Reviewed:    The following studies were reviewed today:   EKG:  EKG is  ordered today.  The ekg ordered today demonstrates NSR, rate 75, no ST abnormalities  Recent Labs: 08/08/2020: ALT 19; BUN 16; Creatinine, Ser 0.88; Hemoglobin 14.6; Platelets 230; Potassium 4.4; Sodium 140; TSH 4.710  Recent Lipid Panel    Component Value Date/Time   CHOL 133 08/08/2020 0934   CHOL 116 07/22/2012 1303   TRIG 76 08/08/2020 0934   TRIG 102 01/24/2013 1616   TRIG 106 07/22/2012 1303   HDL 40 08/08/2020 0934   HDL 39 (L) 01/24/2013 1616   HDL 40 07/22/2012 1303   CHOLHDL 3.3 08/08/2020 0934   LDLCALC 78 08/08/2020 0934   LDLCALC 58 01/24/2013 1616   LDLCALC 55 07/22/2012 1303    Physical Exam:    VS:  BP (!) 170/96   Pulse 75   Ht 5' 8.5" (1.74 m)   Wt 188 lb 12.8 oz (85.6 kg)   SpO2 96%   BMI 28.29 kg/m     Wt Readings from Last 3 Encounters:  10/17/20 188 lb 12.8 oz (85.6 kg)  08/08/20 184 lb 12.8 oz (83.8 kg)  06/25/20 188 lb 6.4 oz (85.5 kg)     GEN:  Well nourished, well developed in no acute distress HEENT: Normal NECK: No JVD; No carotid bruits LYMPHATICS: No lymphadenopathy CARDIAC: RRR, 1/6 systolic murmur RESPIRATORY:  Clear to auscultation without rales, wheezing or rhonchi  ABDOMEN: Soft, non-tender, non-distended MUSCULOSKELETAL:  No edema; No deformity  SKIN: Warm and dry NEUROLOGIC:  Alert and oriented x 3 PSYCHIATRIC:  Normal affect   ASSESSMENT:    1. Heart murmur   2. Numbness of toes   3. Hyperlipidemia, unspecified hyperlipidemia type   4. Elevated BP without diagnosis of hypertension    PLAN:     Heart murmur: Subtle systolic murmur, will check echocardiogram for further evaluation  Lower extremity numbness: Check ABIs  Hyperlipidemia: On simvastatin 20 mg daily.  LDL 78 on 08/08/2020  Elevated BP: BP elevated in clinic today, no history of hypertension.  Reports has had issues with whitecoat hypertension, though BP significantly elevated (170/96).  Discussed starting antihypertensive but he would like to hold off at this time.  Would recommend checking BP twice daily for next week and calling with results.  Also advised to bring his home BP monitor with him to his next clinic appointment to calibrate.  RTC in 6 months   Medication Adjustments/Labs and Tests Ordered: Current medicines are reviewed at length with the patient today.  Concerns regarding medicines are outlined above.  Orders Placed This Encounter  Procedures   EKG 12-Lead   ECHOCARDIOGRAM COMPLETE   VAS [place] ABI WITH/WO TBI   VAS [place] LOWER EXTREMITY ARTERIAL DUPLEX    No orders of the defined types were placed in this encounter.   Patient Instructions  Medication Instructions:  Your physician recommends that you continue on your current medications as directed. Please refer to the Current Medication list given to you today.  Testing/Procedures: Your physician has requested that you have an echocardiogram. Echocardiography is a painless test that uses sound waves to create images of your heart. It provides your doctor with information about the size and shape of your heart and how well your heart's chambers and valves are working. This procedure takes approximately one hour. There are no restrictions for this procedure.  This will be done at our [facility] location:  [place] has requested that you have an ankle brachial index (ABI). During this test an ultrasound and blood pressure cuff are used to evaluate the arteries that supply the arms and legs with blood. Allow  thirty minutes for this exam. There are no restrictions or special instructions.  Follow-Up: At [facility], you and your health needs are our priority.  As part of our continuing mission to provide you with exceptional heart care, we have created designated Provider Care Teams.  These Care Teams include your primary Cardiologist (physician) and Advanced Practice Providers (APPs -  Physician Assistants and Nurse Practitioners) who all work together to provide you with the care you need, when you need it.  We recommend signing up for the patient portal called "MyChart".  Sign up information is provided on this After Visit Summary.  MyChart is used to connect with patients for Virtual Visits (Telemedicine).  Patients are able to view lab/test results, encounter notes, upcoming appointments, etc.  Non-urgent messages can be sent to your provider as well.   To learn more about what you can do with MyChart, go to [email or web address].    Your next appointment:   6 month(s)  The format for your next appointment:   In Person  Provider:   Oswaldo Milian, MD   Other Instructions Please check your blood pressure at home daily, write it down.  Call the office or send message via Mychart with the readings in 1 week for Dr. Gardiner Rhyme to review.     Signed, Donato Heinz, MD  10/17/2020 1:36 PM    [facility]

## 2020-10-21 ENCOUNTER — Encounter: Payer: Medicare Other | Admitting: Family Medicine

## 2020-11-04 ENCOUNTER — Encounter: Payer: Self-pay | Admitting: Family Medicine

## 2020-11-04 ENCOUNTER — Ambulatory Visit (INDEPENDENT_AMBULATORY_CARE_PROVIDER_SITE_OTHER): Payer: Medicare Other | Admitting: Family Medicine

## 2020-11-04 ENCOUNTER — Other Ambulatory Visit: Payer: Self-pay

## 2020-11-04 VITALS — BP 137/83 | HR 78 | Temp 98.0°F | Ht 68.5 in | Wt 189.2 lb

## 2020-11-04 DIAGNOSIS — E039 Hypothyroidism, unspecified: Secondary | ICD-10-CM | POA: Diagnosis not present

## 2020-11-04 DIAGNOSIS — F419 Anxiety disorder, unspecified: Secondary | ICD-10-CM | POA: Diagnosis not present

## 2020-11-04 DIAGNOSIS — F32A Depression, unspecified: Secondary | ICD-10-CM | POA: Diagnosis not present

## 2020-11-04 MED ORDER — SIMVASTATIN 20 MG PO TABS: ORAL_TABLET | ORAL | 3 refills | Status: AC

## 2020-11-04 MED ORDER — MIRTAZAPINE 30 MG PO TABS: 30.0000 mg | ORAL_TABLET | Freq: Every day | ORAL | 1 refills | Status: DC

## 2020-11-04 MED ORDER — QUETIAPINE FUMARATE 25 MG PO TABS: 25.0000 mg | ORAL_TABLET | Freq: Every day | ORAL | 1 refills | Status: DC

## 2020-11-04 MED ORDER — LEVOTHYROXINE SODIUM 100 MCG PO TABS: 100.0000 ug | ORAL_TABLET | Freq: Every day | ORAL | 1 refills | Status: DC

## 2020-11-04 NOTE — Progress Notes (Signed)
Subjective:  Patient ID: [name], male    DOB: 29-May-1940  Age: 80 y.o. MRN: [id number]  CC: Annual Exam   HPI [name] presents for recheck after recent cardiology evaluation. Declines a CPE today. Concerned about multiple elevatred BP readings. Also cards ordered circulation tests for the legs. .  Pt feels like he is dragging. Exhausted. Went to asthma MD. Found  no problem. Feeling dead tired every morning. Better in the evening. Feels lousy all over. Has been fighting depression. Under the care of psychiatry with chlordiazepoxide and mirtazapine.   Pt states he is not inclined toward suicide. When thins go wrong iwshes he could die. Born on [birth year]. Dad abandoned himi before birth. Mom was very unhappy , blamed him. Feels he has lived his life and would be better off dead. I am not about to commit suicide.    GAD 7 : Generalized Anxiety Score 11/04/2020 08/08/2020  Nervous, Anxious, on Edge 3 2  Control/stop worrying 3 2  Worry too much - different things 3 2  Trouble relaxing 3 2  Restless 1 1  Easily annoyed or irritable 1 2  Afraid - awful might happen 3 2  Total GAD 7 Score 17 13  Anxiety Difficulty Not difficult at all Not difficult at all      Depression screen [facility] 2/9 11/04/2020 11/04/2020 08/08/2020  Decreased Interest 3 0 2  Down, Depressed, Hopeless 3 0 2  PHQ - 2 Score 6 0 4  Altered sleeping 0 - 0  Tired, decreased energy 3 - 3  Change in appetite 0 - 1  Feeling bad or failure about yourself  3 - 2  Trouble concentrating 3 - 2  Moving slowly or fidgety/restless 0 - 1  Suicidal thoughts 3 - 2  PHQ-9 Score 18 - 15  Difficult doing work/chores Not difficult at all - Not difficult at all  Some recent data might be hidden    History [name] has a past medical history of Allergic rhinitis, cause unspecified, Allergy, Anxiety, Arthritis, Depression, GERD (gastroesophageal reflux disease), Hyperlipidemia, Hypothyroidism, Irritable bowel syndrome,  Kidney stones, Obesity, Panic attacks (03/2015), Shoulder pain, right, and Unspecified asthma(493.90).   He has a past surgical history that includes Tonsillectomy; Vasectomy; Rotator cuff repair (2005); Cataract extraction bilateral w/ anterior vitrectomy (2005); and Transurethral resection of prostate (2005).   His family history includes Breast cancer in his mother; Diabetes in his father; Heart disease in his father; Kidney disease in his mother; Uterine cancer in his mother.He reports that he quit smoking about 51 years ago. His smoking use included cigarettes. He has a 4.00 pack-year smoking history. He has never used smokeless tobacco. He reports that he does not drink alcohol and does not use drugs.    ROS Review of Systems  Constitutional:  Positive for activity change (due to decline of energy) and fatigue. Negative for fever.  HENT: Negative.    Respiratory:  Negative for shortness of breath.   Cardiovascular:  Negative for chest pain.  Musculoskeletal:  Negative for arthralgias.  Skin:  Negative for rash.  Psychiatric/Behavioral:  Positive for dysphoric mood and sleep disturbance. Negative for suicidal ideas. The patient is nervous/anxious.    Objective:  BP 137/83   Pulse 78   Temp 98 F (36.7 C)   Ht 5' 8.5" (1.74 m)   Wt 189 lb 3.2 oz (85.8 kg)   SpO2 94%   BMI 28.35 kg/m   BP Readings from Last  3 Encounters:  11/04/20 137/83  10/17/20 (!) 170/96  08/08/20 137/85    Wt Readings from Last 3 Encounters:  11/04/20 189 lb 3.2 oz (85.8 kg)  10/17/20 188 lb 12.8 oz (85.6 kg)  08/08/20 184 lb 12.8 oz (83.8 kg)     Physical Exam Vitals reviewed.  Constitutional:      Appearance: He is well-developed.  HENT:     Head: Normocephalic and atraumatic.     Right Ear: External ear normal.     Left Ear: External ear normal.     Mouth/Throat:     Pharynx: No oropharyngeal exudate or posterior oropharyngeal erythema.  Eyes:     Pupils: Pupils are equal, round, and  reactive to light.  Cardiovascular:     Rate and Rhythm: Normal rate and regular rhythm.     Heart sounds: Murmur (faint, 2/6) heard.  Pulmonary:     Effort: No respiratory distress.     Breath sounds: Normal breath sounds.  Musculoskeletal:     Cervical back: Normal range of motion and neck supple.  Neurological:     Mental Status: He is alert and oriented to person, place, and time.      Assessment & Plan:   [name] was seen today for annual exam.  Diagnoses and all orders for this visit:  Anxiety and depression  Hypothyroidism, unspecified type  Other orders -     simvastatin (ZOCOR) 20 MG tablet; TAKE 1 TABLET BY MOUTH EVERYDAY AT BEDTIME -     mirtazapine (REMERON) 30 MG tablet; Take 1 tablet (30 mg total) by mouth at bedtime. -     levothyroxine (SYNTHROID) 100 MCG tablet; Take 1 tablet (100 mcg total) by mouth daily. -     QUEtiapine (SEROQUEL) 25 MG tablet; Take 1 tablet (25 mg total) by mouth at bedtime.      I have changed [name] mirtazapine and levothyroxine. I am also having him start on QUEtiapine. Additionally, I am having him maintain his multivitamin, Vitamin D, aspirin, Budesonide (RHINOCORT AQUA NA), ALPRAZolam, omeprazole, chlordiazePOXIDE, albuterol, and simvastatin.  Allergies as of 11/04/2020       Reactions   Celexa [citalopram Hydrobromide]    Panic attacks   Cymbalta [duloxetine Hcl]    Panic / anxiety attacks   Procardia [nifedipine] Other (See Comments)   Weakness   Wellbutrin [bupropion] Anxiety   Panic attacks        Medication List        Accurate as of November 04, 2020  5:05 PM. If you have any questions, ask your nurse or doctor.          albuterol 108 (90 Base) MCG/ACT inhaler Commonly known as: VENTOLIN HFA Inhale 2 puffs into the lungs every 4 (four) hours as needed for wheezing or shortness of breath (coughing).   ALPRAZolam 0.5 MG tablet Commonly known as: XANAX Take 0.5 mg by mouth 2 (two) times daily  as needed.   aspirin 81 MG tablet Take 81 mg by mouth daily.   chlordiazePOXIDE 5 MG capsule Commonly known as: LIBRIUM Take 5 mg by mouth at bedtime.   levothyroxine 100 MCG tablet Commonly known as: SYNTHROID Take 1 tablet (100 mcg total) by mouth daily. What changed:  medication strength how much to take Changed by: Claretta Fraise, MD   mirtazapine 30 MG tablet Commonly known as: REMERON Take 1 tablet (30 mg total) by mouth at bedtime. What changed:  medication strength how much to take Changed by: Cletus Gash  Gurvir Schrom, MD   multivitamin tablet Take 1 tablet by mouth daily.   omeprazole 20 MG capsule Commonly known as: PRILOSEC Take 1 capsule (20 mg total) by mouth 2 (two) times daily before a meal.   QUEtiapine 25 MG tablet Commonly known as: SEROQUEL Take 1 tablet (25 mg total) by mouth at bedtime. Started by: Claretta Fraise, MD   RHINOCORT AQUA NA Place 1 puff into the nose. Three times a week   simvastatin 20 MG tablet Commonly known as: ZOCOR TAKE 1 TABLET BY MOUTH EVERYDAY AT BEDTIME   Vitamin D 50 MCG (2000 UT) Caps Take 1 capsule by mouth daily.       36 min spent face to face. More than half in counseling regarding fatigue, thyroid and anxiety/depression sx & tx.  Follow-up: Return in about 6 weeks (around 12/16/2020).  Claretta Fraise, M.D.

## 2020-11-07 ENCOUNTER — Other Ambulatory Visit: Payer: Self-pay | Admitting: Family Medicine

## 2020-11-08 ENCOUNTER — Other Ambulatory Visit: Payer: Self-pay

## 2020-11-08 ENCOUNTER — Ambulatory Visit (HOSPITAL_COMMUNITY)
Admission: RE | Admit: 2020-11-08 | Discharge: 2020-11-08 | Disposition: A | Discharge status: Home / Self Care | Payer: Medicare Other | Source: Ambulatory Visit | Attending: Internal Medicine | Admitting: Internal Medicine

## 2020-11-08 ENCOUNTER — Ambulatory Visit (HOSPITAL_BASED_OUTPATIENT_CLINIC_OR_DEPARTMENT_OTHER): Payer: Medicare Other

## 2020-11-08 DIAGNOSIS — R011 Cardiac murmur, unspecified: Secondary | ICD-10-CM | POA: Diagnosis not present

## 2020-11-08 DIAGNOSIS — R200 Anesthesia of skin: Secondary | ICD-10-CM | POA: Insufficient documentation

## 2020-11-09 LAB — ECHOCARDIOGRAM COMPLETE
Area-P 1/2: 3.65 cm2
S' Lateral: 3.2 cm

## 2020-11-11 ENCOUNTER — Telehealth: Payer: Self-pay | Admitting: Cardiology

## 2020-11-11 NOTE — Telephone Encounter (Signed)
Left message to call back  

## 2020-11-11 NOTE — Telephone Encounter (Signed)
Follow Up:     Patient is calling [name] back from today. If he is not there, he will be back in about 30 minutes.

## 2020-11-12 NOTE — Telephone Encounter (Signed)
Patient was returning a call from the Nurse

## 2020-11-12 NOTE — Telephone Encounter (Signed)
Called patient back, gave results- patient had multiple questions in regards to why the testing was needed. He feels that his whole life of working out has caused his heart muscle to be larger- he feels that the testing is not needed but he would like to discuss further with Hayley- I advised I would send over a message and she would call back as soon as he could. He is not available for the next hour-  He also is advising on some blood pressure readings that he dropped off.   Will route to primary RN.   Thanks!

## 2020-11-12 NOTE — Telephone Encounter (Signed)
Patient was  calling back because he hasnt heard anything. And he wants to make sure that the addiction testing is really need. Please advise

## 2020-11-13 ENCOUNTER — Telehealth: Payer: Self-pay | Admitting: Family Medicine

## 2020-11-13 NOTE — Telephone Encounter (Signed)
Spoke to patient-discussed results and recommendations.  He request to hold off on further testing at this time.   Follow up appointment scheduled in October to discuss with MD further.

## 2020-11-13 NOTE — Telephone Encounter (Signed)
Left message for patient to call back and schedule Medicare Annual Wellness Visit (AWV)    Last AWV ;01/24/13 please schedule at anytime with health coach

## 2020-12-05 ENCOUNTER — Ambulatory Visit: Payer: Medicare Other | Admitting: Cardiology

## 2020-12-05 ENCOUNTER — Other Ambulatory Visit: Payer: Self-pay

## 2020-12-05 ENCOUNTER — Encounter: Payer: Self-pay | Admitting: Cardiology

## 2020-12-05 VITALS — BP 140/88 | HR 85 | Ht 68.0 in | Wt 191.0 lb

## 2020-12-05 DIAGNOSIS — I77819 Aortic ectasia, unspecified site: Secondary | ICD-10-CM

## 2020-12-05 DIAGNOSIS — R200 Anesthesia of skin: Secondary | ICD-10-CM | POA: Diagnosis not present

## 2020-12-05 DIAGNOSIS — I517 Cardiomegaly: Secondary | ICD-10-CM | POA: Diagnosis not present

## 2020-12-05 DIAGNOSIS — I10 Essential (primary) hypertension: Secondary | ICD-10-CM

## 2020-12-05 DIAGNOSIS — R400 Somnolence: Secondary | ICD-10-CM | POA: Diagnosis not present

## 2020-12-05 DIAGNOSIS — E785 Hyperlipidemia, unspecified: Secondary | ICD-10-CM

## 2020-12-05 MED ORDER — LOSARTAN POTASSIUM 25 MG PO TABS: 25.0000 mg | ORAL_TABLET | Freq: Every day | ORAL | 3 refills | Status: DC

## 2020-12-05 NOTE — Progress Notes (Signed)
Cardiology Office Note:    Date:  12/05/2020   ID:  [name], DOB 11-Apr-1940, MRN [id number]  PCP:  Claretta Fraise, MD  Cardiologist:  None  Electrophysiologist:  None   Referring MD: Claretta Fraise, MD   No chief complaint on file.    History of Present Illness:    [name] is a 80 y.o. male with a hx of hyperlipidemia, hypothyroidism who presents for follow-up.  He was referred by Dr. Livia Snellen for evaluation of heart murmur, initially seen on 10/17/2020.  He denies any chest pain, dyspnea, headedness, syncope, lower extremity edema, or palpitations.  He works out for 1 hour/day on [employer] track, denies any exertional symptoms.  Does report he has issues with toes feeling cold and numb.  Reports has history of whitecoat hypertension.  Has BP monitor at home but does not check regularly.  He smoked off and on until 1970.  Quit drinking alcohol in 1980.  Reports father had heart issues but he did not have any contact with his father and does not know details.  Echocardiogram showed EF 50 to 55%, moderate asymmetric LVH basal septum, normal RV function, no significant valvular disease, mild dilatation of ascending aorta measuring 41 mm.  Since last clinic visit, he reports that he has been doing okay.  He continues to feel poorly when he wakes up in the morning.  States that he feels very fatigued throughout the morning.  Home BP log shows BP 130s to 170s over 70s to 90s.  Continue to exercise an hour per day on the [employer].  Denies any chest pain, dyspnea, lightheadedness, syncope, lower extremity edema, or palpitations.   BP Readings from Last 3 Encounters:  12/05/20 140/88  11/04/20 137/83  10/17/20 (!) 170/96       Past Medical History:  Diagnosis Date   Allergic rhinitis, cause unspecified    Allergy    a lot seasonal allergies   Anxiety    Arthritis    degenerative arthritis of the neck   Depression    GERD (gastroesophageal reflux disease)     Hyperlipidemia    Hypothyroidism    Irritable bowel syndrome    Kidney stones    per pt, not sure if had kidney stones.   Obesity    Panic attacks 03/2015   Shoulder pain, right    Unspecified asthma(493.90)     Past Surgical History:  Procedure Laterality Date   CATARACT EXTRACTION BILATERAL W/ ANTERIOR VITRECTOMY  2005   ROTATOR CUFF REPAIR  2005   Left Shoulder   TONSILLECTOMY     TRANSURETHRAL RESECTION OF PROSTATE  2005   VASECTOMY      Current Medications: Current Meds  Medication Sig   albuterol (VENTOLIN HFA) 108 (90 Base) MCG/ACT inhaler Inhale 2 puffs into the lungs every 4 (four) hours as needed for wheezing or shortness of breath (coughing).   ALPRAZolam (XANAX) 0.5 MG tablet Take 0.5 mg by mouth 2 (two) times daily as needed.   aspirin 81 MG tablet Take 81 mg by mouth daily.   chlordiazePOXIDE (LIBRIUM) 5 MG capsule Take 5 mg by mouth at bedtime.   Cholecalciferol (VITAMIN D) 2000 UNITS CAPS Take 1 capsule by mouth daily.   levothyroxine (SYNTHROID) 100 MCG tablet Take 1 tablet (100 mcg total) by mouth daily.   losartan (COZAAR) 25 MG tablet Take 1 tablet (25 mg total) by mouth daily.   mirtazapine (REMERON) 30 MG tablet Take 1 tablet (30 mg  total) by mouth at bedtime.   Multiple Vitamin (MULTIVITAMIN) tablet Take 1 tablet by mouth daily.   omeprazole (PRILOSEC) 20 MG capsule TAKE 1 CAPSULE (20 MG TOTAL) BY MOUTH 2 (TWO) TIMES DAILY BEFORE A MEAL.   QUEtiapine (SEROQUEL) 25 MG tablet Take 1 tablet (25 mg total) by mouth at bedtime.   simvastatin (ZOCOR) 20 MG tablet TAKE 1 TABLET BY MOUTH EVERYDAY AT BEDTIME     Allergies:   Celexa [citalopram hydrobromide], Cymbalta [duloxetine hcl], Procardia [nifedipine], and Wellbutrin [bupropion]   Social History   Socioeconomic History   Marital status: Widowed    Spouse name: Not on file   Number of children: Not on file   Years of education: Not on file   Highest education level: Not on file  Occupational History    Occupation: retired  Tobacco Use   Smoking status: Former    Packs/day: 2.00    Years: 2.00    Pack years: 4.00    Types: Cigarettes    Quit date: 02/23/1969    Years since quitting: 51.8   Smokeless tobacco: Never  Vaping Use   Vaping Use: Never used  Substance and Sexual Activity   Alcohol use: No    Alcohol/week: 0.0 standard drinks   Drug use: No   Sexual activity: Not on file  Other Topics Concern   Not on file  Social History Narrative   Not on file   Social Determinants of Health   Financial Resource Strain: Not on file  Food Insecurity: Not on file  Transportation Needs: Not on file  Physical Activity: Not on file  Stress: Not on file  Social Connections: Not on file     Family History: The patient's family history includes Breast cancer in his mother; Diabetes in his father; Heart disease in his father; Kidney disease in his mother; Uterine cancer in his mother.  ROS:   Please see the history of present illness.     All other systems reviewed and are negative.  EKGs/Labs/Other Studies Reviewed:    The following studies were reviewed today:   EKG:  EKG is  ordered today.  The ekg ordered today demonstrates NSR, rate 75, no ST abnormalities  Recent Labs: 08/08/2020: ALT 19; BUN 16; Creatinine, Ser 0.88; Hemoglobin 14.6; Platelets 230; Potassium 4.4; Sodium 140; TSH 4.710  Recent Lipid Panel    Component Value Date/Time   CHOL 133 08/08/2020 0934   CHOL 116 07/22/2012 1303   TRIG 76 08/08/2020 0934   TRIG 102 01/24/2013 1616   TRIG 106 07/22/2012 1303   HDL 40 08/08/2020 0934   HDL 39 (L) 01/24/2013 1616   HDL 40 07/22/2012 1303   CHOLHDL 3.3 08/08/2020 0934   LDLCALC 78 08/08/2020 0934   LDLCALC 58 01/24/2013 1616   LDLCALC 55 07/22/2012 1303    Physical Exam:    VS:  BP 140/88   Pulse 85   Ht 5\' 8"  (1.727 m)   Wt 191 lb (86.6 kg)   SpO2 96%   BMI 29.04 kg/m     Wt Readings from Last 3 Encounters:  12/05/20 191 lb (86.6 kg)  11/04/20  189 lb 3.2 oz (85.8 kg)  10/17/20 188 lb 12.8 oz (85.6 kg)     GEN:  Well nourished, well developed in no acute distress HEENT: Normal NECK: No JVD; No carotid bruits LYMPHATICS: No lymphadenopathy CARDIAC: RRR, 1/6 systolic murmur RESPIRATORY:  Clear to auscultation without rales, wheezing or rhonchi  ABDOMEN: Soft, non-tender, non-distended  MUSCULOSKELETAL:  No edema; No deformity  SKIN: Warm and dry NEUROLOGIC:  Alert and oriented x 3 PSYCHIATRIC:  Normal affect   ASSESSMENT:    1. LVH (left ventricular hypertrophy)   2. Numbness of toes   3. Hyperlipidemia, unspecified hyperlipidemia type   4. Essential hypertension   5. Aortic dilatation (HCC)   6. Daytime somnolence     PLAN:    LVH: Echocardiogram showed EF 50 to 55%, moderate asymmetric LVH basal septum, normal RV function, no significant valvular disease, mild dilatation of ascending aorta measuring 41 mm. -Differential diagnosis for his LVH includes hypertensive heart disease versus HCM versus amyloidosis.  Recommend checking cardiac MRI to rule out amyloidosis  Lower extremity numbness: Normal ABIs in 11/08/2020  Hyperlipidemia: On simvastatin 20 mg daily.  LDL 78 on 08/08/2020  Hypertension: New diagnosis.  Given aortic dilatation and LVH on echo, recommend starting treatment.  Recommend starting losartan 25 mg daily.  Check BMP in 1 week (he has appointment with Dr. Livia Snellen next week, would prefer to check labs there).  Recommend checking BP daily for next 2 weeks and calling with results.   Aortic dilatation: Mild dilatation of ascending aorta measuring 41 mm on echocardiogram.  Recommend follow-up CTA chest in 1 year  Daytime somnolence: Recommend checking home sleep study to evaluate for OSA  RTC in 6 months  Medication Adjustments/Labs and Tests Ordered: Current medicines are reviewed at length with the patient today.  Concerns regarding medicines are outlined above.  Orders Placed This Encounter   Procedures   MR CARDIAC MORPHOLOGY W WO CONTRAST   Home sleep test     Meds ordered this encounter  Medications   losartan (COZAAR) 25 MG tablet    Sig: Take 1 tablet (25 mg total) by mouth daily.    Dispense:  90 tablet    Refill:  3     Patient Instructions  Medication Instructions:  START Losartan 25 mg daily  Please check your blood pressure at home twice daily, write it down.  Call the office or send message via Mychart with the readings in 2 weeks for Dr. Gardiner Rhyme to review.   *If you need a refill on your cardiac medications before your next appointment, please call your pharmacy*   Lab Work: Next week with Dr. Livia Snellen  Testing/Procedures: Your physician has requested that you have a cardiac MRI. Cardiac MRI uses a computer to create images of your heart as its beating, producing both still and moving pictures of your heart and major blood vessels. For further information please visit [email or web address] Please follow the instruction sheet given to you today for more information.  Your physician has recommended that you have a sleep study. This test records several body functions during sleep, including: brain activity, eye movement, oxygen and carbon dioxide blood levels, heart rate and rhythm, breathing rate and rhythm, the flow of air through your mouth and nose, snoring, body muscle movements, and chest and belly movement.  Follow-Up: At [facility], you and your health needs are our priority.  As part of our continuing mission to provide you with exceptional heart care, we have created designated Provider Care Teams.  These Care Teams include your primary Cardiologist (physician) and Advanced Practice Providers (APPs -  Physician Assistants and Nurse Practitioners) who all work together to provide you with the care you need, when you need it.  We recommend signing up for the patient portal called "MyChart".  Sign up information is provided on this After  Visit  Summary.  MyChart is used to connect with patients for Virtual Visits (Telemedicine).  Patients are able to view lab/test results, encounter notes, upcoming appointments, etc.  Non-urgent messages can be sent to your provider as well.   To learn more about what you can do with MyChart, go to [email or web address].    Your next appointment:   6 month(s)  The format for your next appointment:   In Person  Provider:   Oswaldo Milian, MD      Signed, Donato Heinz, MD  12/05/2020 4:11 PM    [facility]

## 2020-12-05 NOTE — Patient Instructions (Signed)
Medication Instructions:  START Losartan 25 mg daily  Please check your blood pressure at home twice daily, write it down.  Call the office or send message via Mychart with the readings in 2 weeks for Dr. Gardiner Rhyme to review.   *If you need a refill on your cardiac medications before your next appointment, please call your pharmacy*   Lab Work: Next week with Dr. Livia Snellen  Testing/Procedures: Your physician has requested that you have a cardiac MRI. Cardiac MRI uses a computer to create images of your heart as its beating, producing both still and moving pictures of your heart and major blood vessels. For further information please visit [email or web address] Please follow the instruction sheet given to you today for more information.  Your physician has recommended that you have a sleep study. This test records several body functions during sleep, including: brain activity, eye movement, oxygen and carbon dioxide blood levels, heart rate and rhythm, breathing rate and rhythm, the flow of air through your mouth and nose, snoring, body muscle movements, and chest and belly movement.  Follow-Up: At [facility], you and your health needs are our priority.  As part of our continuing mission to provide you with exceptional heart care, we have created designated Provider Care Teams.  These Care Teams include your primary Cardiologist (physician) and Advanced Practice Providers (APPs -  Physician Assistants and Nurse Practitioners) who all work together to provide you with the care you need, when you need it.  We recommend signing up for the patient portal called "MyChart".  Sign up information is provided on this After Visit Summary.  MyChart is used to connect with patients for Virtual Visits (Telemedicine).  Patients are able to view lab/test results, encounter notes, upcoming appointments, etc.  Non-urgent messages can be sent to your provider as well.   To learn more about what you can do with  MyChart, go to [email or web address].    Your next appointment:   6 month(s)  The format for your next appointment:   In Person  Provider:   Oswaldo Milian, MD

## 2020-12-06 ENCOUNTER — Telehealth: Payer: Self-pay | Admitting: *Deleted

## 2020-12-06 NOTE — Telephone Encounter (Signed)
Patient notified of [facility] appointment scheduled on 01/08/21 @ 9:00 AM.

## 2020-12-06 NOTE — Telephone Encounter (Signed)
-----   Message from Silverio Lay, RN sent at 12/05/2020  3:48 PM EDT ----- Regarding: HST HST ordered per Dr. Gardiner Rhyme  Epworth 5

## 2020-12-23 ENCOUNTER — Ambulatory Visit (INDEPENDENT_AMBULATORY_CARE_PROVIDER_SITE_OTHER): Payer: Medicare Other | Admitting: Family Medicine

## 2020-12-23 ENCOUNTER — Other Ambulatory Visit: Payer: Self-pay

## 2020-12-23 ENCOUNTER — Encounter: Payer: Self-pay | Admitting: Family Medicine

## 2020-12-23 VITALS — BP 128/86 | HR 81 | Temp 98.0°F | Ht 68.0 in | Wt 190.6 lb

## 2020-12-23 DIAGNOSIS — E782 Mixed hyperlipidemia: Secondary | ICD-10-CM | POA: Diagnosis not present

## 2020-12-23 DIAGNOSIS — E039 Hypothyroidism, unspecified: Secondary | ICD-10-CM

## 2020-12-23 MED ORDER — OMEPRAZOLE 20 MG PO CPDR: 20.0000 mg | DELAYED_RELEASE_CAPSULE | Freq: Two times a day (BID) | ORAL | 2 refills | Status: AC

## 2020-12-23 NOTE — Progress Notes (Signed)
Subjective:  Patient ID: Kevin GUGLIELMO, male    DOB: 1940-08-13  Age: 80 y.o. MRN: [id number]  CC: Follow-up   HPI [name] presents for  follow-up of hypertension. Patient has no history of headache chest pain or shortness of breath or recent cough. Patient also denies symptoms of TIA such as focal numbness or weakness. Patient denies side effects from medication. States taking it regularly.  Readings shown on Log. Most are at 023 systolic, but he is checking immediately post exercise.  Septal thickening noted on Echo. As a result, a cardiac MRI was ordered and a sleep apnea study.   Concerned  History [name] has a past medical history of Allergic rhinitis, cause unspecified, Allergy, Anxiety, Arthritis, Depression, GERD (gastroesophageal reflux disease), Hyperlipidemia, Hypothyroidism, Irritable bowel syndrome, Kidney stones, Obesity, Panic attacks (03/2015), Shoulder pain, right, and Unspecified asthma(493.90).   He has a past surgical history that includes Tonsillectomy; Vasectomy; Rotator cuff repair (2005); Cataract extraction bilateral w/ anterior vitrectomy (2005); and Transurethral resection of prostate (2005).   His family history includes Breast cancer in his mother; Diabetes in his father; Heart disease in his father; Kidney disease in his mother; Uterine cancer in his mother.He reports that he quit smoking about 51 years ago. His smoking use included cigarettes. He has a 4.00 pack-year smoking history. He has never used smokeless tobacco. He reports that he does not drink alcohol and does not use drugs.  Current Outpatient Medications on File Prior to Visit  Medication Sig Dispense Refill   albuterol (VENTOLIN HFA) 108 (90 Base) MCG/ACT inhaler Inhale 2 puffs into the lungs every 4 (four) hours as needed for wheezing or shortness of breath (coughing). 18 g 1   ALPRAZolam (XANAX) 0.5 MG tablet Take 0.5 mg by mouth 2 (two) times daily as needed.     aspirin 81 MG tablet  Take 81 mg by mouth daily.     chlordiazePOXIDE (LIBRIUM) 5 MG capsule Take 5 mg by mouth at bedtime.     Cholecalciferol (VITAMIN D) 2000 UNITS CAPS Take 1 capsule by mouth daily.     levothyroxine (SYNTHROID) 100 MCG tablet Take 1 tablet (100 mcg total) by mouth daily. 90 tablet 1   losartan (COZAAR) 25 MG tablet Take 1 tablet (25 mg total) by mouth daily. 90 tablet 3   mirtazapine (REMERON) 30 MG tablet Take 1 tablet (30 mg total) by mouth at bedtime. 90 tablet 1   Multiple Vitamin (MULTIVITAMIN) tablet Take 1 tablet by mouth daily.     QUEtiapine (SEROQUEL) 25 MG tablet Take 1 tablet (25 mg total) by mouth at bedtime. 90 tablet 1   simvastatin (ZOCOR) 20 MG tablet TAKE 1 TABLET BY MOUTH EVERYDAY AT BEDTIME 90 tablet 3   Budesonide (RHINOCORT AQUA NA) Place 1 puff into the nose. Three times a week (Patient not taking: No sig reported)     [DISCONTINUED] DULoxetine (CYMBALTA) 20 MG capsule Take 1 capsule (20 mg total) by mouth daily. Can increase it to two tablets in one week 45 capsule 0   No current facility-administered medications on file prior to visit.    ROS Review of Systems  Constitutional:  Negative for fever.  Respiratory:  Negative for shortness of breath.   Cardiovascular:  Negative for chest pain.  Musculoskeletal:  Negative for arthralgias.  Skin:  Negative for rash.   Objective:  BP 128/86   Pulse 81   Temp 98 F (36.7 C)   Ht '5\' 8"'  (1.727 m)  Wt 190 lb 9.6 oz (86.5 kg)   SpO2 95%   BMI 28.98 kg/m   BP Readings from Last 3 Encounters:  12/23/20 128/86  12/05/20 140/88  11/04/20 137/83    Wt Readings from Last 3 Encounters:  12/23/20 190 lb 9.6 oz (86.5 kg)  12/05/20 191 lb (86.6 kg)  11/04/20 189 lb 3.2 oz (85.8 kg)     Physical Exam Constitutional:      General: He is not in acute distress.    Appearance: He is well-developed.  HENT:     Head: Normocephalic and atraumatic.     Right Ear: External ear normal.     Left Ear: External ear normal.      Nose: Nose normal.  Eyes:     Conjunctiva/sclera: Conjunctivae normal.     Pupils: Pupils are equal, round, and reactive to light.  Cardiovascular:     Rate and Rhythm: Normal rate and regular rhythm.     Heart sounds: Normal heart sounds. No murmur heard. Pulmonary:     Effort: Pulmonary effort is normal. No respiratory distress.     Breath sounds: Normal breath sounds. No wheezing or rales.  Abdominal:     Palpations: Abdomen is soft.     Tenderness: There is no abdominal tenderness.  Musculoskeletal:        General: Normal range of motion.     Cervical back: Normal range of motion and neck supple.  Skin:    General: Skin is warm and dry.  Neurological:     Mental Status: He is alert and oriented to person, place, and time.     Deep Tendon Reflexes: Reflexes are normal and symmetric.  Psychiatric:        Behavior: Behavior normal.        Thought Content: Thought content normal.        Judgment: Judgment normal.      Assessment & Plan:   [name] was seen today for follow-up.  Diagnoses and all orders for this visit:  Hypothyroidism, unspecified type -     TSH + free T4  Mixed hyperlipidemia -     CBC with Differential/Platelet -     CMP14+EGFR -     Lipid panel  Other orders -     omeprazole (PRILOSEC) 20 MG capsule; Take 1 capsule (20 mg total) by mouth 2 (two) times daily before a meal.  Allergies as of 12/23/2020       Reactions   Celexa [citalopram Hydrobromide]    Panic attacks   Cymbalta [duloxetine Hcl]    Panic / anxiety attacks   Procardia [nifedipine] Other (See Comments)   Weakness   Wellbutrin [bupropion] Anxiety   Panic attacks        Medication List        Accurate as of December 23, 2020  8:34 PM. If you have any questions, ask your nurse or doctor.          albuterol 108 (90 Base) MCG/ACT inhaler Commonly known as: VENTOLIN HFA Inhale 2 puffs into the lungs every 4 (four) hours as needed for wheezing or shortness of breath  (coughing).   ALPRAZolam 0.5 MG tablet Commonly known as: XANAX Take 0.5 mg by mouth 2 (two) times daily as needed.   aspirin 81 MG tablet Take 81 mg by mouth daily.   chlordiazePOXIDE 5 MG capsule Commonly known as: LIBRIUM Take 5 mg by mouth at bedtime.   levothyroxine 100 MCG tablet Commonly known as: SYNTHROID Take  1 tablet (100 mcg total) by mouth daily.   losartan 25 MG tablet Commonly known as: COZAAR Take 1 tablet (25 mg total) by mouth daily.   mirtazapine 30 MG tablet Commonly known as: REMERON Take 1 tablet (30 mg total) by mouth at bedtime.   multivitamin tablet Take 1 tablet by mouth daily.   omeprazole 20 MG capsule Commonly known as: PRILOSEC Take 1 capsule (20 mg total) by mouth 2 (two) times daily before a meal.   QUEtiapine 25 MG tablet Commonly known as: SEROQUEL Take 1 tablet (25 mg total) by mouth at bedtime.   RHINOCORT AQUA NA Place 1 puff into the nose. Three times a week   simvastatin 20 MG tablet Commonly known as: ZOCOR TAKE 1 TABLET BY MOUTH EVERYDAY AT BEDTIME   Vitamin D 50 MCG (2000 UT) Caps Take 1 capsule by mouth daily.        Meds ordered this encounter  Medications   omeprazole (PRILOSEC) 20 MG capsule    Sig: Take 1 capsule (20 mg total) by mouth 2 (two) times daily before a meal.    Dispense:  180 capsule    Refill:  2      Follow-up: Return in about 6 months (around 06/22/2021).  Claretta Fraise, M.D.

## 2020-12-24 LAB — CBC WITH DIFFERENTIAL/PLATELET
Basophils Absolute: 0 10*3/uL (ref 0.0–0.2)
Basos: 1 %
EOS (ABSOLUTE): 0.2 10*3/uL (ref 0.0–0.4)
Eos: 5 %
Hematocrit: 41.9 % (ref 37.5–51.0)
Hemoglobin: 14.7 g/dL (ref 13.0–17.7)
Immature Grans (Abs): 0 10*3/uL (ref 0.0–0.1)
Immature Granulocytes: 0 %
Lymphocytes Absolute: 2 10*3/uL (ref 0.7–3.1)
Lymphs: 38 %
MCH: 30.9 pg (ref 26.6–33.0)
MCHC: 35.1 g/dL (ref 31.5–35.7)
MCV: 88 fL (ref 79–97)
Monocytes Absolute: 0.5 10*3/uL (ref 0.1–0.9)
Monocytes: 9 %
Neutrophils Absolute: 2.5 10*3/uL (ref 1.4–7.0)
Neutrophils: 47 %
Platelets: 221 10*3/uL (ref 150–450)
RBC: 4.75 x10E6/uL (ref 4.14–5.80)
RDW: 12.1 % (ref 11.6–15.4)
WBC: 5.3 10*3/uL (ref 3.4–10.8)

## 2020-12-24 LAB — LIPID PANEL
Chol/HDL Ratio: 3.6 ratio (ref 0.0–5.0)
Cholesterol, Total: 133 mg/dL (ref 100–199)
HDL: 37 mg/dL — ABNORMAL LOW (ref >39)
LDL Chol Calc (NIH): 78 mg/dL (ref 0–99)
Triglycerides: 93 mg/dL (ref 0–149)
VLDL Cholesterol Cal: 18 mg/dL (ref 5–40)

## 2020-12-24 LAB — CMP14+EGFR
ALT: 19 IU/L (ref 0–44)
AST: 22 IU/L (ref 0–40)
Albumin/Globulin Ratio: 2 (ref 1.2–2.2)
Albumin: 4.6 g/dL (ref 3.7–4.7)
Alkaline Phosphatase: 88 IU/L (ref 44–121)
BUN/Creatinine Ratio: 19 (ref 10–24)
BUN: 14 mg/dL (ref 8–27)
Bilirubin Total: 0.5 mg/dL (ref 0.0–1.2)
CO2: 25 mmol/L (ref 20–29)
Calcium: 9.1 mg/dL (ref 8.6–10.2)
Chloride: 104 mmol/L (ref 96–106)
Creatinine, Ser: 0.75 mg/dL — ABNORMAL LOW (ref 0.76–1.27)
Globulin, Total: 2.3 g/dL (ref 1.5–4.5)
Glucose: 102 mg/dL — ABNORMAL HIGH (ref 70–99)
Potassium: 4.3 mmol/L (ref 3.5–5.2)
Sodium: 142 mmol/L (ref 134–144)
Total Protein: 6.9 g/dL (ref 6.0–8.5)
eGFR: 92 mL/min/{1.73_m2} (ref >59)

## 2020-12-24 LAB — TSH+FREE T4
Free T4: 1.42 ng/dL (ref 0.82–1.77)
TSH: 1.56 u[IU]/mL (ref 0.450–4.500)

## 2020-12-24 NOTE — Progress Notes (Signed)
Hello [name],  Your lab result is normal and/or stable.Some minor variations that are not significant are commonly marked abnormal, but do not represent any medical problem for you.  Best regards, Claretta Fraise, M.D.

## 2021-01-06 ENCOUNTER — Ambulatory Visit (HOSPITAL_COMMUNITY): Payer: Medicare Other

## 2021-01-08 ENCOUNTER — Encounter (HOSPITAL_BASED_OUTPATIENT_CLINIC_OR_DEPARTMENT_OTHER): Payer: Medicare Other | Admitting: Cardiovascular Disease

## 2021-01-20 DIAGNOSIS — H26492 Other secondary cataract, left eye: Secondary | ICD-10-CM | POA: Diagnosis not present

## 2021-01-27 ENCOUNTER — Ambulatory Visit (HOSPITAL_BASED_OUTPATIENT_CLINIC_OR_DEPARTMENT_OTHER): Payer: Medicare Other | Admitting: Cardiovascular Disease

## 2021-01-27 DIAGNOSIS — R400 Somnolence: Secondary | ICD-10-CM

## 2021-01-27 DIAGNOSIS — I517 Cardiomegaly: Secondary | ICD-10-CM

## 2021-01-27 DIAGNOSIS — I10 Essential (primary) hypertension: Secondary | ICD-10-CM

## 2021-01-28 ENCOUNTER — Encounter (HOSPITAL_BASED_OUTPATIENT_CLINIC_OR_DEPARTMENT_OTHER): Payer: Medicare Other | Admitting: Cardiovascular Disease

## 2021-01-28 ENCOUNTER — Telehealth: Payer: Self-pay | Admitting: Family Medicine

## 2021-01-28 NOTE — Telephone Encounter (Signed)
Patient declined AWV stated he does not do medicare wellness visits - Do not call back to offer to schedule

## 2021-01-29 ENCOUNTER — Ambulatory Visit (HOSPITAL_BASED_OUTPATIENT_CLINIC_OR_DEPARTMENT_OTHER): Payer: Medicare Other | Attending: Cardiology | Admitting: Cardiovascular Disease

## 2021-01-29 DIAGNOSIS — G4736 Sleep related hypoventilation in conditions classified elsewhere: Secondary | ICD-10-CM | POA: Diagnosis not present

## 2021-01-29 DIAGNOSIS — I517 Cardiomegaly: Secondary | ICD-10-CM | POA: Insufficient documentation

## 2021-01-29 DIAGNOSIS — R400 Somnolence: Secondary | ICD-10-CM

## 2021-01-29 DIAGNOSIS — G4733 Obstructive sleep apnea (adult) (pediatric): Secondary | ICD-10-CM | POA: Insufficient documentation

## 2021-01-29 DIAGNOSIS — I10 Essential (primary) hypertension: Secondary | ICD-10-CM | POA: Insufficient documentation

## 2021-02-10 ENCOUNTER — Encounter (HOSPITAL_BASED_OUTPATIENT_CLINIC_OR_DEPARTMENT_OTHER): Payer: Self-pay | Admitting: Cardiovascular Disease

## 2021-02-10 NOTE — Procedures (Signed)
° ° ° ° °  Patient Name: [name], [name] Date: 01/30/2021 Gender: Male D.O.B: Aug 24, 1940 Age (years): 80 Referring Provider: Oswaldo Milian Height (inches): 6 Interpreting Physician: Shelva Majestic MD, ABSM Weight (lbs): 191 RPSGT: Jacolyn Reedy BMI: 29 MRN: [id number] Neck Size: <br>  CLINICAL INFORMATION Sleep Study Type: HST  Indication for sleep study: Excessive Daytime Sleepiness, Hypertension  Epworth Sleepiness Score: 3  SLEEP STUDY TECHNIQUE A multi-channel overnight portable sleep study was performed. The channels recorded were: nasal airflow, thoracic respiratory movement, and oxygen saturation with a pulse oximetry. Snoring was also monitored.  MEDICATIONS albuterol (VENTOLIN HFA) 108 (90 Base) MCG/ACT inhaler ALPRAZolam (XANAX) 0.5 MG tablet aspirin 81 MG tablet Budesonide (RHINOCORT AQUA NA) chlordiazePOXIDE (LIBRIUM) 5 MG capsule Cholecalciferol (VITAMIN D) 2000 UNITS CAPS levothyroxine (SYNTHROID) 100 MCG tablet losartan (COZAAR) 25 MG tablet mirtazapine (REMERON) 30 MG tablet Multiple Vitamin (MULTIVITAMIN) tablet omeprazole (PRILOSEC) 20 MG capsule QUEtiapine (SEROQUEL) 25 MG tablet simvastatin (ZOCOR) 20 MG tablet Patient self administered medications include: N/A.  SLEEP ARCHITECTURE Patient was studied for 343 minutes. The sleep efficiency was 100.0 % and the patient was supine for 24.6%. The arousal index was 0.0 per hour.  RESPIRATORY PARAMETERS The overall AHI was 14.0 per hour, with a central apnea index of 0.2 per hour. There was a significant positional component with supine sleep AHI 49.0 versus non-supine sleep AHI 2.6/h.  The oxygen nadir was 84% during sleep.  CARDIAC DATA Mean heart rate during sleep was 73.7 bpm.  IMPRESSIONS - Mild obstructive sleep apnea occurred during this study (AHI 14.0/h); however, sleep apnea was severe with supine sleep (AHI 49.0/h. - No significant central sleep apnea occurred during this  study (CAI = 0.2/h). - Moderate oxygen desaturation was noted during this study (Min O2 = 84%). - Patient snored 0.2% during the sleep.  DIAGNOSIS - Obstructive Sleep Apnea (G47.33) - Nocturnal Hypoxemia (G47.36)  RECOMMENDATIONS - Therapeutic CPAP titration to determine optimal pressure required to alleviate sleep disordered breathing. Recommend an initial trial of Auto-PAP with EPR of 3 at 6 - 16 cm of water. - Effort should be made to optimize nasal and oropharyngeal patency. - Positional therapy avoiding supine position during sleep. - If patient is against CPAP consider evaluation for a customized oral appliance as alternative therapy. - Avoid alcohol, sedatives and other CNS depressants that may worsen sleep apnea and disrupt normal sleep architecture. - Sleep hygiene should be reviewed to assess factors that may improve sleep quality. - Weight management and regular exercise should be initiated or continued. - Recommend a download and sleep evaluation after one month of therapy.    [Electronically signed] 02/10/2021 01:21 PM  Shelva Majestic MD, [facility], ABSM Diplomate, American Board of Sleep Medicine   NPI: [id number]  [place] PH: [phone]   FX: [phone] [place]

## 2021-02-11 ENCOUNTER — Telehealth: Payer: Self-pay | Admitting: *Deleted

## 2021-02-11 NOTE — Telephone Encounter (Signed)
-----   Message from Troy Sine, MD sent at 02/10/2021  1:26 PM EST ----- Mariann Laster please notify pt of results;  set up for Auto-PAP

## 2021-02-11 NOTE — Telephone Encounter (Signed)
Telephoned the patient to discuss his HST results and recommendations. After a very extensive conversation lasting 30 minutes, all of his questions and concerns were addressed. Both treatment options were discussed in great detail. The patient has decided to opt for the APAP device versus a oral appliance. He also would like to get a device without a modem so that he won't have to wait as long to get a machine. He voiced understanding that Dr Claiborne Billings will not be able to readily see his progress or lack of, if he calls in with a concern. He will need to bring the device and or the device card in to the office to be downloaded. He voiced verbal understanding and still requests to get a non modem machine if possible.

## 2021-02-11 NOTE — Telephone Encounter (Signed)
Patient is following up, again requesting to speak with Mariann Laster, CMA. He would like to inform our office that Dr. Livia Snellen will be sending a request for a referral to an orthodontist. He is hopeful that this will not interfere with his new CPAP order. Please return call to discuss when able.

## 2021-02-13 NOTE — Telephone Encounter (Signed)
Returned a call to th patient. He stated that he contacted Dr Laverle Hobby office to get a consult appointment for a oral appliance evaluation. Referral along with RX and records faxed to Dr Kae Heller office. The patient plans to go with whichever device will cost him less out of pocket $$. CPAP order has been also sent to [facility].

## 2021-02-15 ENCOUNTER — Other Ambulatory Visit: Payer: Self-pay | Admitting: Family Medicine

## 2021-02-19 ENCOUNTER — Telehealth: Payer: Self-pay | Admitting: Cardiovascular Disease

## 2021-02-19 NOTE — Telephone Encounter (Signed)
New Message:    Patient says he need a letter stating that he will not be able to do jury duty on  03-11-21, because of his Sleep Apnea. Please  fax tot [phone] Att: Loletta Specter Number is [phone]

## 2021-03-11 NOTE — Telephone Encounter (Signed)
This is today- and Dr.Kelly is out of office. Will remove from call list.  Thanks!

## 2021-03-19 ENCOUNTER — Telehealth: Payer: Self-pay | Admitting: Family Medicine

## 2021-03-19 ENCOUNTER — Encounter: Payer: Self-pay | Admitting: Family Medicine

## 2021-03-19 ENCOUNTER — Ambulatory Visit (INDEPENDENT_AMBULATORY_CARE_PROVIDER_SITE_OTHER): Payer: Medicare Other | Admitting: Family Medicine

## 2021-03-19 VITALS — BP 131/76 | HR 74 | Temp 98.0°F | Ht 69.0 in | Wt 194.8 lb

## 2021-03-19 DIAGNOSIS — F419 Anxiety disorder, unspecified: Secondary | ICD-10-CM | POA: Diagnosis not present

## 2021-03-19 DIAGNOSIS — F32A Depression, unspecified: Secondary | ICD-10-CM | POA: Diagnosis not present

## 2021-03-19 MED ORDER — MIRTAZAPINE 45 MG PO TABS: 45.0000 mg | ORAL_TABLET | Freq: Every day | ORAL | 2 refills | Status: AC

## 2021-03-19 NOTE — Telephone Encounter (Signed)
Patient wanted to know if [facility] called his medication in and I checked it for him and told him the medication was called into [facility].

## 2021-03-19 NOTE — Progress Notes (Signed)
° °  Subjective:  Patient ID: [name], male    DOB: 04-Jul-1940  Age: 81 y.o. MRN: [id number]  CC: Medical Management of Chronic Issues   HPI [name] presents for concern for refills of librium and xanax. He states that his psychiatrist lost her license to prescribe controlled substances.  He needs for me to start doing that for him right away.  He needs treatment for depression and anxiety.  See PHQ score and GAD scores noted below.  Patient has several pills left and states that when those run out he is concerned about the possibility of withdrawal.  I explained to [name] that I was not comfortable prescribing benzodiazepines for him since I do not normally use them for anxiety and depression diagnoses.  In particular lab was not comfortable prescribing 2 different benzodiazepines for the same patient.  I offered for him to see a psychiatrist.  I told him I would arrange referral.  The patient became rather demanding that he receive his medication.  He stated that I was not offering him a choice.  He insisted on explaining that the psychiatrist did not do anything wrong she just had too large of a percentage of her patients taking controlled substances.  As result it was not inappropriate for him to get the medicines.  He stated that as a primary care doctor I was not likely at all to be prescribing his many controlled substances so it would not push my percent over the top if I started prescribing his for him.  Therefore he felt that I was obligated to prescribe his medications for him.  I explained in return to him that I would not prescribe these medications.  I explained that it was mandated in my practice that we taper his many people off in fact is possible.  This led to further demand and suggestion that I was not giving him any options.  I reminded him that I could send him to a psychiatrist.  Eventually as the conversation became circular I had to end the interview.  At that  time he stated he had 1 question for me.  After further discussion of his diagnosis of sleep apnea he wondered if an oral device might help his apnea.  I stated that it had a possibility.  He would need to see his sleep specialist if he desired that equipment.  As a result of demanding and belligerent interchange, I feel that it is inappropriate for me to continue has his physician.  At this time I have requested that he be removed from the office practice.  This will be pursued by our office manager and practice administrator on my behalf.   32 minutes contact time. More than 1/2 spent in consultation regarding referral and prescribing benzodiazepines.  Follow-up: PRN.   Claretta Fraise, M.D.

## 2021-03-20 ENCOUNTER — Encounter: Payer: Self-pay | Admitting: Family Medicine

## 2021-03-20 NOTE — Progress Notes (Signed)
Dismissal letter written and given to Dr. Livia Snellen for signature.

## 2021-04-01 ENCOUNTER — Telehealth: Payer: Self-pay | Admitting: *Deleted

## 2021-04-01 NOTE — Telephone Encounter (Signed)
Received a call from sharon with [facility] informing me that she contacted the patient for CPAP set up. He told her that he has already gotten a oral appliance.  CPAP order will be voided.

## 2021-04-03 DIAGNOSIS — G4733 Obstructive sleep apnea (adult) (pediatric): Secondary | ICD-10-CM | POA: Diagnosis not present

## 2021-04-14 NOTE — Progress Notes (Signed)
Cardiology Office Note:    Date:  04/17/2021   ID:  [name], DOB 19-Jul-1940, MRN [id number]  PCP:  Pcp, No  Cardiologist:  None  Electrophysiologist:  None   Referring MD: Claretta Fraise, MD   Chief Complaint  Patient presents with   Hypertension    History of Present Illness:    [name] is a 81 y.o. male with a hx of hyperlipidemia, hypothyroidism who presents for follow-up.  He was referred by Dr. Livia Snellen for evaluation of heart murmur, initially seen on 10/17/2020.  He denies any chest pain, dyspnea, headedness, syncope, lower extremity edema, or palpitations.  He works out for 1 hour/day on [employer] track, denies any exertional symptoms.  Does report he has issues with toes feeling cold and numb.  Reports has history of whitecoat hypertension.  Has BP monitor at home but does not check regularly.  He smoked off and on until 1970.  Quit drinking alcohol in 1980.  Reports father had heart issues but he did not have any contact with his father and does not know details.  Echocardiogram showed EF 50 to 55%, moderate asymmetric LVH basal septum, normal RV function, no significant valvular disease, mild dilatation of ascending aorta measuring 41 mm.  Since last clinic visit, he reports that he is doing okay.  Denies any chest pain, dyspnea,  lower extremity edema, or palpitations.  BP 130-140s at home.  Spends 1 hour per day on [employer], no exertional symptoms.  Some lightheadedness with standing up, no syncope, has happened 2-3 times.  He needs to establish with new PCP, was dismissed from prior PCP clinic.   BP Readings from Last 3 Encounters:  04/17/21 (!) 144/90  03/19/21 131/76  12/23/20 128/86     Past Medical History:  Diagnosis Date   Allergic rhinitis, cause unspecified    Allergy    a lot seasonal allergies   Anxiety    Arthritis    degenerative arthritis of the neck   Depression    GERD (gastroesophageal reflux disease)    Hyperlipidemia     Hypothyroidism    Irritable bowel syndrome    Kidney stones    per pt, not sure if had kidney stones.   Obesity    Panic attacks 03/27/2015   Shoulder pain, right    Sleep apnea    Unspecified asthma(493.90)     Past Surgical History:  Procedure Laterality Date   CATARACT EXTRACTION BILATERAL W/ ANTERIOR VITRECTOMY  2005   ROTATOR CUFF REPAIR  2005   Left Shoulder   TONSILLECTOMY     TRANSURETHRAL RESECTION OF PROSTATE  2005   VASECTOMY      Current Medications: Current Meds  Medication Sig   ALPRAZolam (XANAX) 0.5 MG tablet Take 0.5 mg by mouth 2 (two) times daily as needed.   aspirin 81 MG tablet Take 81 mg by mouth daily.   Cholecalciferol (VITAMIN D) 2000 UNITS CAPS Take 1 capsule by mouth daily.   levothyroxine (SYNTHROID) 100 MCG tablet TAKE 1 TABLET BY MOUTH EVERY DAY   mirtazapine (REMERON) 45 MG tablet Take 1 tablet (45 mg total) by mouth at bedtime.   Multiple Vitamin (MULTIVITAMIN) tablet Take 1 tablet by mouth daily.   omeprazole (PRILOSEC) 20 MG capsule Take 1 capsule (20 mg total) by mouth 2 (two) times daily before a meal.   simvastatin (ZOCOR) 20 MG tablet TAKE 1 TABLET BY MOUTH EVERYDAY AT BEDTIME   [DISCONTINUED] losartan (COZAAR) 25 MG tablet  Take 1 tablet (25 mg total) by mouth daily.     Allergies:   Celexa [citalopram hydrobromide], Cymbalta [duloxetine hcl], Procardia [nifedipine], and Wellbutrin [bupropion]   Social History   Socioeconomic History   Marital status: Widowed    Spouse name: Not on file   Number of children: Not on file   Years of education: Not on file   Highest education level: Not on file  Occupational History   Occupation: retired  Tobacco Use   Smoking status: Former    Packs/day: 2.00    Years: 2.00    Pack years: 4.00    Types: Cigarettes    Quit date: 02/23/1969    Years since quitting: 52.1   Smokeless tobacco: Never  Vaping Use   Vaping Use: Never used  Substance and Sexual Activity   Alcohol use: No     Alcohol/week: 0.0 standard drinks   Drug use: No   Sexual activity: Not on file  Other Topics Concern   Not on file  Social History Narrative   Not on file   Social Determinants of Health   Financial Resource Strain: Not on file  Food Insecurity: Not on file  Transportation Needs: Not on file  Physical Activity: Not on file  Stress: Not on file  Social Connections: Not on file     Family History: The patient's family history includes Breast cancer in his mother; Diabetes in his father; Heart disease in his father; Kidney disease in his mother; Uterine cancer in his mother.  ROS:   Please see the history of present illness.     All other systems reviewed and are negative.  EKGs/Labs/Other Studies Reviewed:    The following studies were reviewed today:   EKG:   04/17/21: NSR, rate 74, no ST abnormalities  Recent Labs: 12/23/2020: ALT 19; BUN 14; Creatinine, Ser 0.75; Hemoglobin 14.7; Platelets 221; Potassium 4.3; Sodium 142; TSH 1.560  Recent Lipid Panel    Component Value Date/Time   CHOL 133 12/23/2020 1427   CHOL 116 07/22/2012 1303   TRIG 93 12/23/2020 1427   TRIG 102 01/24/2013 1616   TRIG 106 07/22/2012 1303   HDL 37 (L) 12/23/2020 1427   HDL 39 (L) 01/24/2013 1616   HDL 40 07/22/2012 1303   CHOLHDL 3.6 12/23/2020 1427   LDLCALC 78 12/23/2020 1427   LDLCALC 58 01/24/2013 1616   LDLCALC 55 07/22/2012 1303    Physical Exam:    VS:  BP (!) 144/90    Pulse 74    Ht 5\' 9"  (1.753 m)    Wt 191 lb 6.4 oz (86.8 kg)    SpO2 97%    BMI 28.26 kg/m     Wt Readings from Last 3 Encounters:  04/17/21 191 lb 6.4 oz (86.8 kg)  03/19/21 194 lb 12.8 oz (88.4 kg)  01/29/21 190 lb (86.2 kg)     GEN:  Well nourished, well developed in no acute distress HEENT: Normal NECK: No JVD; No carotid bruits LYMPHATICS: No lymphadenopathy CARDIAC: RRR, 1/6 systolic murmur RESPIRATORY:  Clear to auscultation without rales, wheezing or rhonchi  ABDOMEN: Soft, non-tender,  non-distended MUSCULOSKELETAL:  No edema; No deformity  SKIN: Warm and dry NEUROLOGIC:  Alert and oriented x 3 PSYCHIATRIC:  Normal affect   ASSESSMENT:    1. LVH (left ventricular hypertrophy)   2. Essential hypertension   3. Hyperlipidemia, unspecified hyperlipidemia type   4. Aortic dilatation (HCC)   5. OSA (obstructive sleep apnea)  PLAN:    LVH: Echocardiogram showed EF 50 to 55%, moderate asymmetric LVH basal septum, normal RV function, no significant valvular disease, mild dilatation of ascending aorta measuring 41 mm. -Differential diagnosis for his LVH includes hypertensive heart disease versus HCM versus amyloidosis.  Recommend checking cardiac MRI to rule out amyloidosis  Lower extremity numbness: Normal ABIs in 11/08/2020  Hyperlipidemia: On simvastatin 20 mg daily.  LDL 78 on 08/08/2020  Hypertension: On losartan 25 mg daily.  BP remains elevated, will increase losartan to 50 mg daily.  Asked to check BP daily for next 2 weeks and bring log to appointment with pharmacy hypertension clinic in 2 weeks.  Asked to bring home BP monitor to calibrate.  Will check BMET at that time  Aortic dilatation: Mild dilatation of ascending aorta measuring 41 mm on echocardiogram.  Recommend follow-up MRA chest in 1 year  OSA: Positive sleep study 01/29/2021, CPAP recommended.  Patient preferred oral appliance, has started to use  RTC in 6 months  Medication Adjustments/Labs and Tests Ordered: Current medicines are reviewed at length with the patient today.  Concerns regarding medicines are outlined above.  Orders Placed This Encounter  Procedures   MR ANGIO CHEST W WO CONTRAST   MR CARDIAC MORPHOLOGY W WO CONTRAST   Basic metabolic panel   Hemoglobin and hematocrit, blood   Basic metabolic panel   EKG [id number]    Meds ordered this encounter  Medications   losartan (COZAAR) 50 MG tablet    Sig: Take 1 tablet (50 mg total) by mouth daily.    Dispense:  90 tablet    Refill:   3    Patient Instructions  Medication Instructions:  INCREASE the Losartan to 50 mg once daily  *If you need a refill on your cardiac medications before your next appointment, please call your pharmacy*   Lab Work: Your provider would like for you to return in 2 weeks at your pharmacy appointment to have the following labs drawn: BMET. You do not need an appointment for the lab. Once in our office lobby there is a podium where you can sign in and ring the doorbell to alert [place] that you are here. The lab is open from 8:00 am to 4:30 pm; closed for lunch from 12:45pm-1:45pm.  If you have labs (blood work) drawn today and your tests are completely normal, you will receive your results only by: [place] (if you have MyChart) OR A paper copy in the mail If you have any lab test that is abnormal or we need to change your treatment, we will call you to review the results.   Testing/Procedures: Dr. Gardiner Rhyme would like for you to have a Cardiac MRI and MRA of the Aorta in 6 months. They will call you to set this up.   Follow-Up: At [facility], you and your health needs are our priority.  As part of our continuing mission to provide you with exceptional heart care, we have created designated Provider Care Teams.  These Care Teams include your primary Cardiologist (physician) and Advanced Practice Providers (APPs -  Physician Assistants and Nurse Practitioners) who all work together to provide you with the care you need, when you need it.  We recommend signing up for the patient portal called "MyChart".  Sign up information is provided on this After Visit Summary.  MyChart is used to connect with patients for Virtual Visits (Telemedicine).  Patients are able to view lab/test results, encounter notes, upcoming appointments, etc.  Non-urgent messages can be sent to your provider as well.   To learn more about what you can do with MyChart, go to [email or web address].    Your next  appointment:   Follow up in 2 weeks with pharmd. Please bring your blood pressure cuff to the appointment Follow up in 6 months after the MRI and MRA with Dr. Gardiner Rhyme  Other Instructions Dr. Gardiner Rhyme would like you to check your blood pressure daily for the next 2 weeks.  Keep a journal of these daily blood pressure and heart rate readings and call our office or send a message through [place] with the results. Thank you!  It is best to check your BP 1-2 hours after taking your medications to see the medications effectiveness on your BP.    Here are some tips that our clinical pharmacists share for home BP monitoring:          Rest 10 minutes before taking your blood pressure.          Don't smoke or drink caffeinated beverages for at least 30 minutes before.          Take your blood pressure before (not after) you eat.          Sit comfortably with your back supported and both feet on the floor (don't cross your legs).          Elevate your arm to heart level on a table or a desk.          Use the proper sized cuff. It should fit smoothly and snugly around your bare upper arm. There should be enough room to slip a fingertip under the cuff. The bottom edge of the cuff should be 1 inch above the crease of the elbow.    You are scheduled for Cardiac MRI in _____August 2023_________. Please arrive at the [facility] main entrance of [facility] at ________________ (30-45 minutes prior to test start time). ?  [facility] [address] [place], [place] [address] [phone]  Please take advantage of the free valet parking available at the MAIN entrance (A entrance). Proceed to the [facility] Radiology Department (First Floor). ? Magnetic resonance imaging (MRI) is a painless test that produces images of the inside of the body without using Xrays.  During an MRI, strong magnets and radio waves work together in a Research officer, political party to form detailed images.   MRI  images may provide more details about a medical condition than X-rays, CT scans, and ultrasounds can provide.  You may be given earphones to listen for instructions.  You may eat a light breakfast and take medications as ordered with the exception of HCTZ (fluid pill, other). Please avoid stimulants for 12 hr prior to test. (Ie. Caffeine, nicotine, chocolate, or antihistamine medications)  If a contrast material will be used, an IV will be inserted into one of your veins. Contrast material will be injected into your IV. It will leave your body through your urine within a day. You may be told to drink plenty of fluids to help flush the contrast material out of your system.  You will be asked to remove all metal, including: Watch, jewelry, and other metal objects including hearing aids, hair pieces and dentures. Also wearable glucose monitoring systems (ie. Freestyle Libre and Omnipods) (Braces and fillings normally are not a problem.)   TEST WILL TAKE APPROXIMATELY 1 HOUR  PLEASE NOTIFY SCHEDULING AT LEAST 24 HOURS IN ADVANCE IF YOU ARE  UNABLE TO KEEP YOUR APPOINTMENT. [phone]  Please call Marchia Bond, cardiac imaging nurse navigator with any questions/concerns. Marchia Bond RN Navigator Cardiac Imaging Gordy Clement RN Navigator Cardiac Imaging [facility] Heart and Vascular Services [phone] Office     Signed, Donato Heinz, MD  04/17/2021 4:09 PM    [facility]

## 2021-04-17 ENCOUNTER — Other Ambulatory Visit: Payer: Self-pay

## 2021-04-17 ENCOUNTER — Encounter: Payer: Self-pay | Admitting: Cardiology

## 2021-04-17 ENCOUNTER — Ambulatory Visit: Payer: Medicare Other | Admitting: Cardiology

## 2021-04-17 VITALS — BP 144/90 | HR 74 | Ht 69.0 in | Wt 191.4 lb

## 2021-04-17 DIAGNOSIS — I517 Cardiomegaly: Secondary | ICD-10-CM | POA: Diagnosis not present

## 2021-04-17 DIAGNOSIS — E785 Hyperlipidemia, unspecified: Secondary | ICD-10-CM | POA: Diagnosis not present

## 2021-04-17 DIAGNOSIS — G4733 Obstructive sleep apnea (adult) (pediatric): Secondary | ICD-10-CM | POA: Diagnosis not present

## 2021-04-17 DIAGNOSIS — I10 Essential (primary) hypertension: Secondary | ICD-10-CM | POA: Diagnosis not present

## 2021-04-17 DIAGNOSIS — I77819 Aortic ectasia, unspecified site: Secondary | ICD-10-CM

## 2021-04-17 MED ORDER — LOSARTAN POTASSIUM 50 MG PO TABS: 50.0000 mg | ORAL_TABLET | Freq: Every day | ORAL | 3 refills | Status: AC

## 2021-04-17 NOTE — Patient Instructions (Addendum)
Medication Instructions:  INCREASE the Losartan to 50 mg once daily  *If you need a refill on your cardiac medications before your next appointment, please call your pharmacy*   Lab Work: Your provider would like for you to return in 2 weeks at your pharmacy appointment to have the following labs drawn: BMET. You do not need an appointment for the lab. Once in our office lobby there is a podium where you can sign in and ring the doorbell to alert [place] that you are here. The lab is open from 8:00 am to 4:30 pm; closed for lunch from 12:45pm-1:45pm.  If you have labs (blood work) drawn today and your tests are completely normal, you will receive your results only by: [place] (if you have MyChart) OR A paper copy in the mail If you have any lab test that is abnormal or we need to change your treatment, we will call you to review the results.   Testing/Procedures: Dr. Gardiner Rhyme would like for you to have a Cardiac MRI and MRA of the Aorta in 6 months. They will call you to set this up.   Follow-Up: At [facility], you and your health needs are our priority.  As part of our continuing mission to provide you with exceptional heart care, we have created designated Provider Care Teams.  These Care Teams include your primary Cardiologist (physician) and Advanced Practice Providers (APPs -  Physician Assistants and Nurse Practitioners) who all work together to provide you with the care you need, when you need it.  We recommend signing up for the patient portal called "MyChart".  Sign up information is provided on this After Visit Summary.  MyChart is used to connect with patients for Virtual Visits (Telemedicine).  Patients are able to view lab/test results, encounter notes, upcoming appointments, etc.  Non-urgent messages can be sent to your provider as well.   To learn more about what you can do with MyChart, go to [email or web address].    Your next appointment:   Follow up in 2 weeks  with pharmd. Please bring your blood pressure cuff to the appointment Follow up in 6 months after the MRI and MRA with Dr. Gardiner Rhyme  Other Instructions Dr. Gardiner Rhyme would like you to check your blood pressure daily for the next 2 weeks.  Keep a journal of these daily blood pressure and heart rate readings and call our office or send a message through [place] with the results. Thank you!  It is best to check your BP 1-2 hours after taking your medications to see the medications effectiveness on your BP.    Here are some tips that our clinical pharmacists share for home BP monitoring:          Rest 10 minutes before taking your blood pressure.          Don't smoke or drink caffeinated beverages for at least 30 minutes before.          Take your blood pressure before (not after) you eat.          Sit comfortably with your back supported and both feet on the floor (don't cross your legs).          Elevate your arm to heart level on a table or a desk.          Use the proper sized cuff. It should fit smoothly and snugly around your bare upper arm. There should be enough room to slip a fingertip under the cuff.  The bottom edge of the cuff should be 1 inch above the crease of the elbow.    You are scheduled for Cardiac MRI in _____August 2023_________. Please arrive at the [facility] main entrance of [facility] at ________________ (30-45 minutes prior to test start time). ?  [facility] [address] [place], [place] [address] [phone]  Please take advantage of the free valet parking available at the MAIN entrance (A entrance). Proceed to the [facility] Radiology Department (First Floor). ? Magnetic resonance imaging (MRI) is a painless test that produces images of the inside of the body without using Xrays.  During an MRI, strong magnets and radio waves work together in a Research officer, political party to form detailed images.   MRI images may provide more details about a  medical condition than X-rays, CT scans, and ultrasounds can provide.  You may be given earphones to listen for instructions.  You may eat a light breakfast and take medications as ordered with the exception of HCTZ (fluid pill, other). Please avoid stimulants for 12 hr prior to test. (Ie. Caffeine, nicotine, chocolate, or antihistamine medications)  If a contrast material will be used, an IV will be inserted into one of your veins. Contrast material will be injected into your IV. It will leave your body through your urine within a day. You may be told to drink plenty of fluids to help flush the contrast material out of your system.  You will be asked to remove all metal, including: Watch, jewelry, and other metal objects including hearing aids, hair pieces and dentures. Also wearable glucose monitoring systems (ie. Freestyle Libre and Omnipods) (Braces and fillings normally are not a problem.)   TEST WILL TAKE APPROXIMATELY 1 HOUR  PLEASE NOTIFY SCHEDULING AT LEAST 24 HOURS IN ADVANCE IF YOU ARE UNABLE TO KEEP YOUR APPOINTMENT. [phone]  Please call Marchia Bond, cardiac imaging nurse navigator with any questions/concerns. Marchia Bond RN Navigator Cardiac Imaging Gordy Clement RN Navigator Cardiac Imaging [facility] Heart and Vascular Services [phone] Office

## 2021-04-20 ENCOUNTER — Other Ambulatory Visit: Payer: Self-pay | Admitting: Family Medicine

## 2021-05-02 ENCOUNTER — Other Ambulatory Visit: Payer: Self-pay

## 2021-05-02 ENCOUNTER — Ambulatory Visit: Payer: Medicare Other | Admitting: Pharmacist

## 2021-05-02 VITALS — BP 151/103 | HR 80

## 2021-05-02 DIAGNOSIS — I10 Essential (primary) hypertension: Secondary | ICD-10-CM

## 2021-05-02 MED ORDER — AMLODIPINE BESYLATE 5 MG PO TABS: 5.0000 mg | ORAL_TABLET | Freq: Every day | ORAL | 1 refills | Status: DC

## 2021-05-02 NOTE — Progress Notes (Signed)
Patient ID: [name]                 DOB: 1940/07/12                      MRN: [id number] ? ? ? ? ?HPI: ?[name] is a 81 y.o. male referred by Dr. Gardiner Rhyme to HTN clinic. PMH is significant for HTN, HLD, and anxiety and history of smoking.  Had positive sleep study on 01/29/21. ? ?Patient presents today in good spirits.  Brought BP cuff and log.  Uses a Relion upper arm cuff which was made by [employer]. Has ben checking BP daily since last visit with Dr Gardiner Rhyme. ? ?BP log: ?3/9:149/89 ?3/8: 140/82 ?3/7: 143/93 ?3/6: 139/65 ?3/5: 135/68 ?3/4: 140/65 ?3/3: 153/88 ? ?Patient's orthodontist has prescribed him an oral device for his sleep apnea which he has been using. Every few weeks, device is adjusted by a small amount to help open up his airways. Patient prefers to not use a CPAP.  Did not know of the link between OSA and HTN. ? ?Patient currently managed on losatran '50mg'$  and reports no adverse effects. ? ?Current HTN meds: losartan '50mg'$   ?BP goal: <130/80 ? ?Wt Readings from Last 3 Encounters:  ?04/17/21 191 lb 6.4 oz (86.8 kg)  ?03/19/21 194 lb 12.8 oz (88.4 kg)  ?01/29/21 190 lb (86.2 kg)  ? ?BP Readings from Last 3 Encounters:  ?04/17/21 (!) 144/90  ?03/19/21 131/76  ?12/23/20 128/86  ? ?Pulse Readings from Last 3 Encounters:  ?04/17/21 74  ?03/19/21 74  ?12/23/20 81  ? ? ?Renal function: ?CrCl cannot be calculated (Patient's most recent lab result is older than the maximum 21 days allowed.). ? ?Past Medical History:  ?Diagnosis Date  ? Allergic rhinitis, cause unspecified   ? Allergy   ? a lot seasonal allergies  ? Anxiety   ? Arthritis   ? degenerative arthritis of the neck  ? Depression   ? GERD (gastroesophageal reflux disease)   ? Hyperlipidemia   ? Hypothyroidism   ? Irritable bowel syndrome   ? Kidney stones   ? per pt, not sure if had kidney stones.  ? Obesity   ? Panic attacks 03/27/2015  ? Shoulder pain, right   ? Sleep apnea   ? Unspecified asthma(493.90)   ? ? ?Current Outpatient Medications  on File Prior to Visit  ?Medication Sig Dispense Refill  ? ALPRAZolam (XANAX) 0.5 MG tablet Take 0.5 mg by mouth 2 (two) times daily as needed.    ? aspirin 81 MG tablet Take 81 mg by mouth daily.    ? Budesonide (RHINOCORT AQUA NA) Place 1 puff into the nose. Three times a week (Patient not taking: Reported on 04/17/2021)    ? chlordiazePOXIDE (LIBRIUM) 5 MG capsule Take 5 mg by mouth at bedtime. (Patient not taking: Reported on 04/17/2021)    ? Cholecalciferol (VITAMIN D) 2000 UNITS CAPS Take 1 capsule by mouth daily.    ? levothyroxine (SYNTHROID) 100 MCG tablet TAKE 1 TABLET BY MOUTH EVERY DAY 90 tablet 2  ? losartan (COZAAR) 50 MG tablet Take 1 tablet (50 mg total) by mouth daily. 90 tablet 3  ? mirtazapine (REMERON) 45 MG tablet Take 1 tablet (45 mg total) by mouth at bedtime. 30 tablet 2  ? Multiple Vitamin (MULTIVITAMIN) tablet Take 1 tablet by mouth daily.    ? omeprazole (PRILOSEC) 20 MG capsule Take 1 capsule (20 mg total) by mouth 2 (  two) times daily before a meal. 180 capsule 2  ? simvastatin (ZOCOR) 20 MG tablet TAKE 1 TABLET BY MOUTH EVERYDAY AT BEDTIME 90 tablet 3  ? [DISCONTINUED] DULoxetine (CYMBALTA) 20 MG capsule Take 1 capsule (20 mg total) by mouth daily. Can increase it to two tablets in one week 45 capsule 0  ? ?No current facility-administered medications on file prior to visit.  ? ? ?Allergies  ?Allergen Reactions  ? Celexa [Citalopram Hydrobromide]   ?  Panic attacks  ? Cymbalta [Duloxetine Hcl]   ?  Panic / anxiety attacks  ? Procardia [Nifedipine] Other (See Comments)  ?  Weakness ?  ? Wellbutrin [Bupropion] Anxiety  ?  Panic attacks  ? ? ? ?Assessment/Plan: ? ?1. Hypertension -  Patient BP in room today 151/103 which is above goal of <130/80.  Home readings above goal as well although lower than while in clinic.  Verified home cuff with office machine and cuff appears accurate.  Contributing factors likely OSA and and anxiety.  Advised patient will need further BP lowering as discussed  options such as increasing losartan or adding on CCB or thiazide diuretic.  Patient would prefer to not use diuretic so will start amlodipine '5mg'$  once daily.  Recheck in 4 weeks. ? ?Continue losartan '50mg'$  daily ?Start amlodipine '5mg'$  daily ?Recheck in clinic in 4 weeks ? ?Karren Cobble, PharmD, BCACP, [place], CPP ?[address], [address] ?[place], [place], [address] ?Phone: [phone], Fax: [phone]  ?

## 2021-05-02 NOTE — Patient Instructions (Addendum)
It was nice meeting you today ? ?We would like your blood pressure to be less than 130/80 ? ?Please continue your losartan '50mg'$  daily ? ?We will start a new medication called amlodipine '5mg'$ .  You will take one tablet once a day ? ?Please call with any questions ? ?Karren Cobble, PharmD, BCACP, [place], CPP ?[address], [address] ?[place], [place], [address] ?Phone: [phone], Fax: [phone]  ? ? ?

## 2021-05-05 ENCOUNTER — Encounter: Payer: Self-pay | Admitting: Pharmacist

## 2021-05-16 ENCOUNTER — Other Ambulatory Visit: Payer: Self-pay | Admitting: Family Medicine

## 2021-06-03 ENCOUNTER — Ambulatory Visit: Payer: Medicare Other | Admitting: Pharmacist Clinician (PhC)/ Clinical Pharmacy Specialist

## 2021-06-03 ENCOUNTER — Encounter: Payer: Self-pay | Admitting: Pharmacist Clinician (PhC)/ Clinical Pharmacy Specialist

## 2021-06-03 DIAGNOSIS — I517 Cardiomegaly: Secondary | ICD-10-CM

## 2021-06-03 DIAGNOSIS — I10 Essential (primary) hypertension: Secondary | ICD-10-CM | POA: Diagnosis not present

## 2021-06-03 NOTE — Patient Instructions (Addendum)
Return for a a follow up appointment - schedule to see Dr. Gardiner Rhyme in late summer ? ?Go to the lab today to check metabolic panel ? ?Take your BP meds as follows: ? Continue with losartan and amlodipine ? ?Bring all of your meds, your BP cuff and your record of home blood pressures to your next appointment.  Exercise as you?re able, try to walk approximately 30 minutes per day.  Keep salt intake to a minimum, especially watch canned and prepared boxed foods.  Eat more fresh fruits and vegetables and fewer canned items.  Avoid eating in fast food restaurants.  ? ? HOW TO TAKE YOUR BLOOD PRESSURE: ?Rest 5 minutes before taking your blood pressure. ? Don?t smoke or drink caffeinated beverages for at least 30 minutes before. ?Take your blood pressure before (not after) you eat. ?Sit comfortably with your back supported and both feet on the floor (don?t cross your legs). ?Elevate your arm to heart level on a table or a desk. ?Use the proper sized cuff. It should fit smoothly and snugly around your bare upper arm. There should be enough room to slip a fingertip under the cuff. The bottom edge of the cuff should be 1 inch above the crease of the elbow. ?Ideally, take 3 measurements at one sitting and record the average. ? ? ?

## 2021-06-03 NOTE — Assessment & Plan Note (Signed)
Patient with essential hypertension, currently at BP goal and doing well on combination of amlodipine and losartan.  Advised to continue with current medications and monitoring.  If he is able to get OSA dental appliance to give him benefit, he could potentially see a drop in BP readings.  If this happens, advised him to reach out and we can consider tapering down/off one medication.   ?

## 2021-06-03 NOTE — Progress Notes (Signed)
? ? ? ?06/03/2021 ?Janice Norrie ?[birth year] ?[id number] ? ? ?HPI:  DALLAS TOROK is a 81 y.o. male patient of Dr Gardiner Rhyme, with a PMH below who presents today for hypertension clinic evaluation.  He was most recently seen by Karren Cobble PharmD at which time his pressure was 153/103.  Home readings at that visit were showing more 135-150/65-89 range, and amlodipine 5 mg daily was added.   He was diagnosed with sleep apnea last December and has been frustrated with treatment attempts.  He is currently using a TAP dental appliance, but notes that despite making adjustments every few days, he is feeling no better.  Wakes tired every morning and takes most of the day to feel better.  He sees someone next week to see further adjustments are possible.   ? ? ?Past Medical History: ?hyperlipidemia 10/22 LDL 78 on simvastatin 20  ?arthritis   ?OSA Using dental appliance  ?GERD On omeprazole 20 mg twice daily  ?hypothyroidism 10/22 TSH WNL, now on levothyroxine 100 mcg  ?  ? ?Blood Pressure Goal:  130/80 ? ?Current Medications: losartan 50 mg qd, amlodipine 5 mg qd ? ?Exercise: 1 hour/day on [employer] with high tension on legs ? ?Home BP readings: cuff noted to be accurate at last  ? 25 readings 130/77 (range 114-143/65-84) ? ?Intolerances: nifedipine - weakness ? ?Labs: 10/22:  Na 142, K 4.3, Glu 102, BUN 14, SCr 0.75, GFR 92 ? ? ?Wt Readings from Last 3 Encounters:  ?04/17/21 191 lb 6.4 oz (86.8 kg)  ?03/19/21 194 lb 12.8 oz (88.4 kg)  ?01/29/21 190 lb (86.2 kg)  ? ?BP Readings from Last 3 Encounters:  ?06/03/21 134/76  ?05/02/21 (!) 151/103  ?04/17/21 (!) 144/90  ? ?Pulse Readings from Last 3 Encounters:  ?06/03/21 70  ?05/02/21 80  ?04/17/21 74  ? ? ?Current Outpatient Medications  ?Medication Sig Dispense Refill  ? ALPRAZolam (XANAX) 0.5 MG tablet Take 0.5 mg by mouth 2 (two) times daily as needed.    ? amLODipine (NORVASC) 5 MG tablet Take 1 tablet (5 mg total) by mouth daily. 90 tablet 1  ? aspirin 81 MG tablet  Take 81 mg by mouth daily.    ? Cholecalciferol (VITAMIN D) 2000 UNITS CAPS Take 1 capsule by mouth daily.    ? levothyroxine (SYNTHROID) 100 MCG tablet TAKE 1 TABLET BY MOUTH EVERY DAY 90 tablet 2  ? losartan (COZAAR) 50 MG tablet Take 1 tablet (50 mg total) by mouth daily. 90 tablet 3  ? mirtazapine (REMERON) 45 MG tablet Take 1 tablet (45 mg total) by mouth at bedtime. 30 tablet 2  ? Multiple Vitamin (MULTIVITAMIN) tablet Take 1 tablet by mouth daily.    ? omeprazole (PRILOSEC) 20 MG capsule Take 1 capsule (20 mg total) by mouth 2 (two) times daily before a meal. 180 capsule 2  ? simvastatin (ZOCOR) 20 MG tablet TAKE 1 TABLET BY MOUTH EVERYDAY AT BEDTIME 90 tablet 3  ? ?No current facility-administered medications for this visit.  ? ? ?Allergies  ?Allergen Reactions  ? Celexa [Citalopram Hydrobromide]   ?  Panic attacks  ? Cymbalta [Duloxetine Hcl]   ?  Panic / anxiety attacks  ? Procardia [Nifedipine] Other (See Comments)  ?  Weakness ?  ? Wellbutrin [Bupropion] Anxiety  ?  Panic attacks  ? ? ?Past Medical History:  ?Diagnosis Date  ? Allergic rhinitis, cause unspecified   ? Allergy   ? a lot seasonal allergies  ? Anxiety   ?  Arthritis   ? degenerative arthritis of the neck  ? Depression   ? GERD (gastroesophageal reflux disease)   ? Hyperlipidemia   ? Hypothyroidism   ? Irritable bowel syndrome   ? Kidney stones   ? per pt, not sure if had kidney stones.  ? Obesity   ? Panic attacks 03/27/2015  ? Shoulder pain, right   ? Sleep apnea   ? Unspecified asthma(493.90)   ? ? ?Blood pressure 134/76, pulse 70. ? ?Hypertension ?Patient with essential hypertension, currently at BP goal and doing well on combination of amlodipine and losartan.  Advised to continue with current medications and monitoring.  If he is able to get OSA dental appliance to give him benefit, he could potentially see a drop in BP readings.  If this happens, advised him to reach out and we can consider tapering down/off one medication.    ? ? ?Tommy Medal PharmD CPP [facility] ?[address] ?[address] ?[place], [place] [address] ?[phone] ?

## 2021-06-04 LAB — BASIC METABOLIC PANEL
BUN/Creatinine Ratio: 23 (ref 10–24)
BUN: 16 mg/dL (ref 8–27)
CO2: 25 mmol/L (ref 20–29)
Calcium: 9.1 mg/dL (ref 8.6–10.2)
Chloride: 104 mmol/L (ref 96–106)
Creatinine, Ser: 0.7 mg/dL — ABNORMAL LOW (ref 0.76–1.27)
Glucose: 121 mg/dL — ABNORMAL HIGH (ref 70–99)
Potassium: 4.1 mmol/L (ref 3.5–5.2)
Sodium: 141 mmol/L (ref 134–144)
eGFR: 94 mL/min/{1.73_m2} (ref >59)

## 2021-06-04 LAB — HEMOGLOBIN AND HEMATOCRIT, BLOOD
Hematocrit: 39.8 % (ref 37.5–51.0)
Hemoglobin: 13.8 g/dL (ref 13.0–17.7)

## 2021-06-10 ENCOUNTER — Other Ambulatory Visit: Payer: Self-pay | Admitting: Family Medicine

## 2021-06-23 ENCOUNTER — Ambulatory Visit: Payer: Medicare Other | Admitting: Family Medicine

## 2021-07-11 DIAGNOSIS — E785 Hyperlipidemia, unspecified: Secondary | ICD-10-CM | POA: Diagnosis not present

## 2021-07-11 DIAGNOSIS — F17210 Nicotine dependence, cigarettes, uncomplicated: Secondary | ICD-10-CM | POA: Diagnosis not present

## 2021-07-11 DIAGNOSIS — I10 Essential (primary) hypertension: Secondary | ICD-10-CM | POA: Diagnosis not present

## 2021-07-11 DIAGNOSIS — E039 Hypothyroidism, unspecified: Secondary | ICD-10-CM | POA: Diagnosis not present

## 2021-07-11 DIAGNOSIS — G4733 Obstructive sleep apnea (adult) (pediatric): Secondary | ICD-10-CM | POA: Diagnosis not present

## 2021-07-11 DIAGNOSIS — K219 Gastro-esophageal reflux disease without esophagitis: Secondary | ICD-10-CM | POA: Diagnosis not present

## 2021-09-15 ENCOUNTER — Other Ambulatory Visit: Payer: Self-pay | Admitting: Cardiology

## 2021-09-15 DIAGNOSIS — I10 Essential (primary) hypertension: Secondary | ICD-10-CM

## 2021-10-15 ENCOUNTER — Ambulatory Visit (HOSPITAL_COMMUNITY): Payer: Medicare Other

## 2021-12-11 ENCOUNTER — Other Ambulatory Visit: Payer: Self-pay | Admitting: Family Medicine

## 2022-01-02 ENCOUNTER — Other Ambulatory Visit: Payer: Self-pay | Admitting: Family Medicine

## 2022-01-12 DIAGNOSIS — G4733 Obstructive sleep apnea (adult) (pediatric): Secondary | ICD-10-CM | POA: Diagnosis not present

## 2022-01-12 DIAGNOSIS — E785 Hyperlipidemia, unspecified: Secondary | ICD-10-CM | POA: Diagnosis not present

## 2022-01-12 DIAGNOSIS — K219 Gastro-esophageal reflux disease without esophagitis: Secondary | ICD-10-CM | POA: Diagnosis not present

## 2022-01-12 DIAGNOSIS — F17210 Nicotine dependence, cigarettes, uncomplicated: Secondary | ICD-10-CM | POA: Diagnosis not present

## 2022-01-12 DIAGNOSIS — E039 Hypothyroidism, unspecified: Secondary | ICD-10-CM | POA: Diagnosis not present

## 2022-01-12 DIAGNOSIS — I10 Essential (primary) hypertension: Secondary | ICD-10-CM | POA: Diagnosis not present

## 2022-01-21 DIAGNOSIS — H31003 Unspecified chorioretinal scars, bilateral: Secondary | ICD-10-CM | POA: Diagnosis not present

## 2022-01-23 DIAGNOSIS — I10 Essential (primary) hypertension: Secondary | ICD-10-CM | POA: Diagnosis not present

## 2022-09-06 ENCOUNTER — Other Ambulatory Visit: Payer: Self-pay | Admitting: Cardiology

## 2022-09-06 DIAGNOSIS — I10 Essential (primary) hypertension: Secondary | ICD-10-CM

## 2022-10-04 ENCOUNTER — Other Ambulatory Visit: Payer: Self-pay | Admitting: Cardiology

## 2022-10-04 DIAGNOSIS — I10 Essential (primary) hypertension: Secondary | ICD-10-CM

## 2022-10-18 ENCOUNTER — Other Ambulatory Visit: Payer: Self-pay | Admitting: Cardiology

## 2022-10-18 DIAGNOSIS — I10 Essential (primary) hypertension: Secondary | ICD-10-CM

## 2022-11-21 ENCOUNTER — Other Ambulatory Visit: Payer: Self-pay | Admitting: Cardiology

## 2022-11-21 DIAGNOSIS — I10 Essential (primary) hypertension: Secondary | ICD-10-CM

## 2022-11-23 MED ORDER — AMLODIPINE BESYLATE 5 MG PO TABS: 5.0000 mg | ORAL_TABLET | Freq: Every day | ORAL | 0 refills | Status: AC

## 2022-11-23 NOTE — Addendum Note (Signed)
Addended by: Margaret Pyle D on: 11/23/2022 04:53 PM   Modules accepted: Orders
